# Patient Record
Sex: Female | Born: 1970 | Race: Black or African American | Hispanic: No | Marital: Married | Smoking: Current every day smoker
Health system: Southern US, Community
[De-identification: ages and names within clinical notes are randomized; demographics above are authoritative.]

## PROBLEM LIST (undated history)

## (undated) DIAGNOSIS — K219 Gastro-esophageal reflux disease without esophagitis: Secondary | ICD-10-CM

## (undated) DIAGNOSIS — I10 Essential (primary) hypertension: Secondary | ICD-10-CM

## (undated) DIAGNOSIS — R569 Unspecified convulsions: Secondary | ICD-10-CM

## (undated) DIAGNOSIS — F32A Depression, unspecified: Secondary | ICD-10-CM

## (undated) DIAGNOSIS — E119 Type 2 diabetes mellitus without complications: Secondary | ICD-10-CM

## (undated) DIAGNOSIS — L732 Hidradenitis suppurativa: Secondary | ICD-10-CM

## (undated) DIAGNOSIS — I639 Cerebral infarction, unspecified: Secondary | ICD-10-CM

## (undated) DIAGNOSIS — J40 Bronchitis, not specified as acute or chronic: Secondary | ICD-10-CM

## (undated) DIAGNOSIS — F329 Major depressive disorder, single episode, unspecified: Secondary | ICD-10-CM

## (undated) DIAGNOSIS — D649 Anemia, unspecified: Secondary | ICD-10-CM

## (undated) DIAGNOSIS — J45909 Unspecified asthma, uncomplicated: Secondary | ICD-10-CM

## (undated) DIAGNOSIS — Z87442 Personal history of urinary calculi: Secondary | ICD-10-CM

## (undated) HISTORY — DX: Depression, unspecified: F32.A

## (undated) HISTORY — DX: Hidradenitis suppurativa: L73.2

## (undated) HISTORY — DX: Unspecified convulsions: R56.9

## (undated) HISTORY — DX: Cerebral infarction, unspecified: I63.9

## (undated) HISTORY — DX: Bronchitis, not specified as acute or chronic: J40

## (undated) HISTORY — DX: Major depressive disorder, single episode, unspecified: F32.9

## (undated) HISTORY — PX: INCISE AND DRAIN ABCESS: PRO64

## (undated) HISTORY — DX: Gastro-esophageal reflux disease without esophagitis: K21.9

---

## 2001-04-24 ENCOUNTER — Other Ambulatory Visit: Admission: RE | Admit: 2001-04-24 | Discharge: 2001-04-24 | Payer: Self-pay | Admitting: Obstetrics and Gynecology

## 2001-05-04 ENCOUNTER — Encounter: Admission: RE | Admit: 2001-05-04 | Discharge: 2001-08-02 | Payer: Self-pay | Admitting: Obstetrics and Gynecology

## 2001-12-29 ENCOUNTER — Encounter: Payer: Self-pay | Admitting: Emergency Medicine

## 2001-12-30 ENCOUNTER — Encounter: Payer: Self-pay | Admitting: Family Medicine

## 2001-12-30 ENCOUNTER — Observation Stay (HOSPITAL_COMMUNITY): Admission: EM | Admit: 2001-12-30 | Discharge: 2001-12-30 | Payer: Self-pay | Admitting: Family Medicine

## 2002-01-04 ENCOUNTER — Encounter: Admission: RE | Admit: 2002-01-04 | Discharge: 2002-01-04 | Payer: Self-pay | Admitting: Family Medicine

## 2005-02-03 ENCOUNTER — Emergency Department (HOSPITAL_COMMUNITY): Admission: EM | Admit: 2005-02-03 | Discharge: 2005-02-03 | Payer: Self-pay | Admitting: Emergency Medicine

## 2005-02-19 ENCOUNTER — Emergency Department (HOSPITAL_COMMUNITY): Admission: EM | Admit: 2005-02-19 | Discharge: 2005-02-19 | Payer: Self-pay | Admitting: Emergency Medicine

## 2005-03-12 ENCOUNTER — Emergency Department (HOSPITAL_COMMUNITY): Admission: EM | Admit: 2005-03-12 | Discharge: 2005-03-12 | Payer: Self-pay | Admitting: Emergency Medicine

## 2005-03-18 ENCOUNTER — Emergency Department (HOSPITAL_COMMUNITY): Admission: EM | Admit: 2005-03-18 | Discharge: 2005-03-18 | Payer: Self-pay | Admitting: Emergency Medicine

## 2005-04-12 ENCOUNTER — Emergency Department (HOSPITAL_COMMUNITY): Admission: EM | Admit: 2005-04-12 | Discharge: 2005-04-12 | Payer: Self-pay | Admitting: Emergency Medicine

## 2005-04-16 ENCOUNTER — Emergency Department (HOSPITAL_COMMUNITY): Admission: EM | Admit: 2005-04-16 | Discharge: 2005-04-16 | Payer: Self-pay | Admitting: Emergency Medicine

## 2005-05-17 ENCOUNTER — Emergency Department (HOSPITAL_COMMUNITY): Admission: EM | Admit: 2005-05-17 | Discharge: 2005-05-17 | Payer: Self-pay | Admitting: Emergency Medicine

## 2005-05-26 ENCOUNTER — Emergency Department (HOSPITAL_COMMUNITY): Admission: EM | Admit: 2005-05-26 | Discharge: 2005-05-27 | Payer: Self-pay | Admitting: Emergency Medicine

## 2005-06-28 ENCOUNTER — Emergency Department (HOSPITAL_COMMUNITY): Admission: EM | Admit: 2005-06-28 | Discharge: 2005-06-28 | Payer: Self-pay | Admitting: *Deleted

## 2006-01-21 ENCOUNTER — Emergency Department (HOSPITAL_COMMUNITY): Admission: EM | Admit: 2006-01-21 | Discharge: 2006-01-21 | Payer: Self-pay | Admitting: Emergency Medicine

## 2006-03-16 ENCOUNTER — Inpatient Hospital Stay (HOSPITAL_COMMUNITY): Admission: AD | Admit: 2006-03-16 | Discharge: 2006-03-16 | Payer: Self-pay | Admitting: Obstetrics and Gynecology

## 2006-06-09 ENCOUNTER — Emergency Department (HOSPITAL_COMMUNITY): Admission: EM | Admit: 2006-06-09 | Discharge: 2006-06-09 | Payer: Self-pay | Admitting: Emergency Medicine

## 2008-06-10 ENCOUNTER — Emergency Department (HOSPITAL_COMMUNITY): Admission: EM | Admit: 2008-06-10 | Discharge: 2008-06-10 | Payer: Self-pay | Admitting: Family Medicine

## 2011-02-19 NOTE — Discharge Summary (Signed)
[place]  Patient:    Caitlin Fritz, Caitlin Fritz Visit Number: [id number] MRN: [id number]          Service Type: MED Location: [phone] Attending Physician:  Willow Ora Dictated by:   Maryelizabeth Rowan, M.D. Admit Date:  12/30/2001 Discharge Date: 12/30/2001                             Discharge Summary  DATE OF BIRTH:  15-Feb-1971  CHIEF COMPLAINT:  Chest pain.  HISTORY OF PRESENT ILLNESS:  Chest pain:  HOSPITAL COURSE:  #1 - CHEST PAIN:   The patient presented complaining of substernal chest pain, acute onset of sharp, left-sided, associated with bilateral hand tingling, numbness, dryness, and feeling of impending doom.  She was concerned she would die from a heart attack.  She had a similar episode in 1998 which was diagnosed as an anxiety attack.  Further counselling revealed positive signs and symptoms of depression with significant stressor at home with difficult relationship with her twin sister who is her roommate.  She denies suicidal or homicidal ideation.  The patient was admitted to the hospital for atypical chest pain.  She was ruled out for an MI.  All enzymes were negative.  The feeling is this is most likely anxiety.  She also has some depression.  She will be placed on new medication list as below.  She needed no nitroglycerin.  Will recommend Xanax as needed for anxiety.  #2 - DIABETES MELLITUS, TYPE 2:  She did have an increased fasting blood sugar this a.m., and we recommended followup and outpatient education.  She is morbidly obese and discussed significant weight loss.  She is also at risk for PCO given her obesity, hirsutism, anemia, and inability to conceive. Apparently she has had some outpatient workup in the past for this.  Will recommend following up her blood sugars and questionable PCO status as an outpatient.  DISCHARGE MEDICATIONS: 1. Protonix 40 mg p.o. q.d. 2. Xanax 0.1 mg t.i.d. p.r.n.  anxiety. 3. Lexapro 10 mg once a day.  FOLLOWUP:  She is to follow up with her primary doctor at [facility] next week. Dictated by:   Maryelizabeth Rowan, M.D. Attending Physician:  Willow Ora DD:  12/30/01 TD:  12/31/01 Job: [id number]

## 2011-10-05 HISTORY — PX: INCISION AND DRAINAGE: SHX5863

## 2013-02-13 ENCOUNTER — Emergency Department (HOSPITAL_COMMUNITY)
Admission: EM | Admit: 2013-02-13 | Discharge: 2013-02-13 | Disposition: A | Discharge status: Home / Self Care | Payer: Self-pay | Attending: Emergency Medicine | Admitting: Emergency Medicine

## 2013-02-13 ENCOUNTER — Emergency Department (HOSPITAL_COMMUNITY): Payer: Self-pay

## 2013-02-13 ENCOUNTER — Encounter (HOSPITAL_COMMUNITY): Payer: Self-pay | Admitting: Emergency Medicine

## 2013-02-13 DIAGNOSIS — F17200 Nicotine dependence, unspecified, uncomplicated: Secondary | ICD-10-CM | POA: Insufficient documentation

## 2013-02-13 DIAGNOSIS — R6883 Chills (without fever): Secondary | ICD-10-CM | POA: Insufficient documentation

## 2013-02-13 DIAGNOSIS — R51 Headache: Secondary | ICD-10-CM | POA: Insufficient documentation

## 2013-02-13 DIAGNOSIS — J3489 Other specified disorders of nose and nasal sinuses: Secondary | ICD-10-CM | POA: Insufficient documentation

## 2013-02-13 DIAGNOSIS — Z79899 Other long term (current) drug therapy: Secondary | ICD-10-CM | POA: Insufficient documentation

## 2013-02-13 DIAGNOSIS — E119 Type 2 diabetes mellitus without complications: Secondary | ICD-10-CM | POA: Insufficient documentation

## 2013-02-13 DIAGNOSIS — R05 Cough: Secondary | ICD-10-CM

## 2013-02-13 DIAGNOSIS — I10 Essential (primary) hypertension: Secondary | ICD-10-CM | POA: Insufficient documentation

## 2013-02-13 DIAGNOSIS — R5381 Other malaise: Secondary | ICD-10-CM | POA: Insufficient documentation

## 2013-02-13 DIAGNOSIS — Z9119 Patient's noncompliance with other medical treatment and regimen: Secondary | ICD-10-CM | POA: Insufficient documentation

## 2013-02-13 DIAGNOSIS — R059 Cough, unspecified: Secondary | ICD-10-CM | POA: Insufficient documentation

## 2013-02-13 DIAGNOSIS — Z91199 Patient's noncompliance with other medical treatment and regimen due to unspecified reason: Secondary | ICD-10-CM | POA: Insufficient documentation

## 2013-02-13 DIAGNOSIS — J45901 Unspecified asthma with (acute) exacerbation: Secondary | ICD-10-CM | POA: Insufficient documentation

## 2013-02-13 DIAGNOSIS — R52 Pain, unspecified: Secondary | ICD-10-CM | POA: Insufficient documentation

## 2013-02-13 HISTORY — DX: Essential (primary) hypertension: I10

## 2013-02-13 HISTORY — DX: Type 2 diabetes mellitus without complications: E11.9

## 2013-02-13 LAB — CBC WITH DIFFERENTIAL/PLATELET
Eosinophils Absolute: 0.1 10*3/uL (ref 0.0–0.7)
Eosinophils Relative: 1 % (ref 0–5)
HCT: 36.9 % (ref 36.0–46.0)
Lymphocytes Relative: 35 % (ref 12–46)
Monocytes Relative: 6 % (ref 3–12)
Neutrophils Relative %: 58 % (ref 43–77)
WBC: 8.2 10*3/uL (ref 4.0–10.5)

## 2013-02-13 LAB — BASIC METABOLIC PANEL
BUN: 5 mg/dL — ABNORMAL LOW (ref 6–23)
CO2: 28 mEq/L (ref 19–32)
Calcium: 9.2 mg/dL (ref 8.4–10.5)
Chloride: 99 mEq/L (ref 96–112)
GFR calc Af Amer: >90 mL/min (ref >90)
GFR calc non Af Amer: >90 mL/min (ref >90)
Glucose, Bld: 334 mg/dL — ABNORMAL HIGH (ref 70–99)

## 2013-02-13 LAB — GLUCOSE, CAPILLARY: Glucose-Capillary: 384 mg/dL — ABNORMAL HIGH (ref 70–99)

## 2013-02-13 MED ORDER — PREDNISONE 20 MG PO TABS: 20.0000 mg | ORAL_TABLET | Freq: Every day | ORAL | Status: DC

## 2013-02-13 MED ORDER — POTASSIUM CHLORIDE CRYS ER 20 MEQ PO TBCR
40.0000 meq | EXTENDED_RELEASE_TABLET | Freq: Once | ORAL | Status: AC
  Administered 2013-02-13: 40 meq via ORAL
  Filled 2013-02-13: qty 2

## 2013-02-13 MED ORDER — ALBUTEROL SULFATE (5 MG/ML) 0.5% IN NEBU
2.5000 mg | INHALATION_SOLUTION | Freq: Once | RESPIRATORY_TRACT | Status: AC
  Administered 2013-02-13: 2.5 mg via RESPIRATORY_TRACT
  Filled 2013-02-13: qty 0.5

## 2013-02-13 MED ORDER — ALBUTEROL SULFATE HFA 108 (90 BASE) MCG/ACT IN AERS: 2.0000 | INHALATION_SPRAY | RESPIRATORY_TRACT | Status: DC | PRN

## 2013-02-13 MED ORDER — INSULIN ASPART 100 UNIT/ML ~~LOC~~ SOLN
10.0000 [IU] | Freq: Once | SUBCUTANEOUS | Status: AC
  Administered 2013-02-13: 10 [IU] via SUBCUTANEOUS
  Filled 2013-02-13: qty 1

## 2013-02-13 MED ORDER — HYDROCODONE-HOMATROPINE 5-1.5 MG/5ML PO SYRP: 2.5000 mL | ORAL_SOLUTION | Freq: Three times a day (TID) | ORAL | Status: DC | PRN

## 2013-02-13 MED ORDER — ALBUTEROL SULFATE HFA 108 (90 BASE) MCG/ACT IN AERS
2.0000 | INHALATION_SPRAY | Freq: Once | RESPIRATORY_TRACT | Status: AC
  Administered 2013-02-13: 2 via RESPIRATORY_TRACT
  Filled 2013-02-13: qty 6.7

## 2013-02-13 NOTE — ED Provider Notes (Signed)
History     CSN: [id number]  Arrival date & time 02/13/13  1312   First MD Initiated Contact with Patient 02/13/13 1336      Chief Complaint  Patient presents with  . Cough  . Generalized Body Aches    (Consider location/radiation/quality/duration/timing/severity/associated sxs/prior treatment) HPI  Pt is a 42 yo F PMHx significant for reactive airway disease, HTN, and DM presenting to the ED with worsening non-productive cough for three days w/ associated generalized fatigue, headache, chills. The cough is worse at night and with laying down. Pt has tried one course of Mucinex w/o relief. No alleviating factors. Pt states she gets bronchitis every year and her symptoms are the same as previous bronchitis Denies SOB, chest pain, leg swelling, rhinorrhea, visual disturbances. She no longer has an inhaler at home. Pt is non-compliant with diabetes or hypertension medications, has not taken medications in one month.   Past Medical History  Diagnosis Date  . Hypertension   . Diabetes mellitus without complication     History reviewed. No pertinent past surgical history.  History reviewed. No pertinent family history.  History  Substance Use Topics  . Smoking status: Current Every Day Smoker  . Smokeless tobacco: Not on file  . Alcohol Use: Yes     Comment: occ    OB History   Grav Para Term Preterm Abortions TAB SAB Ect Mult Living                  Review of Systems  Constitutional: Positive for chills. Negative for fever and diaphoresis.  HENT: Positive for congestion.   Eyes: Negative for visual disturbance.  Respiratory: Positive for cough. Negative for shortness of breath and wheezing.   Cardiovascular: Negative for chest pain, palpitations and leg swelling.  Gastrointestinal: Negative for nausea, vomiting, abdominal pain, diarrhea and constipation.  Genitourinary: Negative.   Musculoskeletal: Negative.   Skin: Negative.   Neurological: Positive for headaches.     Allergies  Clonidine derivatives and Prednisone  Home Medications   Current Outpatient Rx  Name  Route  Sig  Dispense  Refill  . albuterol (PROVENTIL HFA;VENTOLIN HFA) 108 (90 BASE) MCG/ACT inhaler   Inhalation   Inhale 2 puffs into the lungs every 6 (six) hours as needed for wheezing. For wheezing         . albuterol (PROVENTIL HFA;VENTOLIN HFA) 108 (90 BASE) MCG/ACT inhaler   Inhalation   Inhale 2 puffs into the lungs every 4 (four) hours as needed for wheezing.   3.7 g   1   . HYDROcodone-homatropine (HYCODAN) 5-1.5 MG/5ML syrup   Oral   Take 2.5 mLs by mouth every 8 (eight) hours as needed for cough.   30 mL   0   . predniSONE (DELTASONE) 20 MG tablet   Oral   Take 1 tablet (20 mg total) by mouth daily.   5 tablet   0   . predniSONE (DELTASONE) 20 MG tablet   Oral   Take 1 tablet (20 mg total) by mouth daily.   5 tablet   0     BP 158/84  Pulse 91  Temp(Src) 98.9 F (37.2 C) (Oral)  Resp 20  SpO2 100%  LMP 02/03/2013  Physical Exam  Constitutional: She is oriented to person, place, and time. She appears well-developed and well-nourished. No distress.  HENT:  Head: Normocephalic and atraumatic.  Mouth/Throat: Oropharynx is clear and moist. No oropharyngeal exudate.  Eyes: EOM are normal. Pupils are equal, round, and  reactive to light.  Neck: Normal range of motion. Neck supple.  Cardiovascular: Normal rate, regular rhythm and normal heart sounds.   Pulmonary/Chest: Effort normal and breath sounds normal. No accessory muscle usage. No respiratory distress. She has no wheezes. She has no rales. She exhibits tenderness.  Chest wall tender to palpation.   Abdominal: Soft. Bowel sounds are normal. There is no tenderness.  Musculoskeletal: Normal range of motion.  Lymphadenopathy:    She has no cervical adenopathy.  Neurological: She is alert and oriented to person, place, and time.  Skin: Skin is warm and dry. No rash noted. She is not diaphoretic.   Psychiatric: She has a normal mood and affect.    ED Course  Procedures (including critical care time)  Labs Reviewed  GLUCOSE, CAPILLARY - Abnormal; Notable for the following:    Glucose-Capillary 384 (*)    All other components within normal limits  BASIC METABOLIC PANEL - Abnormal; Notable for the following:    Sodium 134 (*)    Potassium 3.0 (*)    Glucose, Bld 334 (*)    BUN 5 (*)    All other components within normal limits  GLUCOSE, CAPILLARY - Abnormal; Notable for the following:    Glucose-Capillary 247 (*)    All other components within normal limits  CBC WITH DIFFERENTIAL   Dg Chest 2 View  02/13/2013   *RADIOLOGY REPORT*  Clinical Data: Cough and fever  CHEST - 2 VIEW  Comparison: 05/26/2005  Findings: The heart and pulmonary vascularity are within normal limits.  The lungs are clear bilaterally.  No acute bony abnormality is seen.  IMPRESSION: No acute abnormality noted.   Original Report Authenticated By: Alcide Clever, M.D.     1. Asthma exacerbation   2. Cough       MDM  Pt CXR negative for acute infiltrate. Patients symptoms are consistent with URI, likely viral etiology, exacerbated by her asthma. Discussed that antibiotics are not indicated for viral infections. Pt will be discharged with symptomatic treatment.  Verbalizes understanding and is agreeable with plan. Pt is hemodynamically stable & in NAD prior to dc. Patient d/w with Dr. Fredderick Phenix, agrees with plan. Patient is stable at time of discharge            Jeannetta Ellis, PA-C 02/14/13 1108

## 2013-02-13 NOTE — ED Notes (Signed)
Pt alert and mentating appropriately upon d/c. Pt given d/c teaching and prescriptions. Pt verbalizes understanding of d/c teaching and has no further questions upon d/c. NAD noted upon d/c. Pt given inhaler to go home with per order by Candise Bowens, PA. Pt ambulatory upon d/c. Pt leaving with d/c teaching and prescriptions.

## 2013-02-13 NOTE — ED Notes (Signed)
Pt sts cough with pain with cough x 3 days; pt sts body aches and low grade fever last pm; pt sts has not had htn or dm meds x 1 month

## 2013-02-13 NOTE — ED Notes (Signed)
Jen, PA at bedside.

## 2013-02-14 LAB — GLUCOSE, CAPILLARY: Glucose-Capillary: 247 mg/dL — ABNORMAL HIGH (ref 70–99)

## 2013-02-14 NOTE — ED Provider Notes (Signed)
Medical screening examination/treatment/procedure(s) were performed by non-physician practitioner and as supervising physician I was immediately available for consultation/collaboration.   Rolan Bucco, MD 02/14/13 [phone]

## 2013-03-30 ENCOUNTER — Emergency Department (HOSPITAL_COMMUNITY)
Admission: EM | Admit: 2013-03-30 | Discharge: 2013-03-30 | Disposition: A | Discharge status: Home / Self Care | Payer: Self-pay | Attending: Emergency Medicine | Admitting: Emergency Medicine

## 2013-03-30 ENCOUNTER — Encounter (HOSPITAL_COMMUNITY): Payer: Self-pay

## 2013-03-30 DIAGNOSIS — K0889 Other specified disorders of teeth and supporting structures: Secondary | ICD-10-CM

## 2013-03-30 DIAGNOSIS — E119 Type 2 diabetes mellitus without complications: Secondary | ICD-10-CM | POA: Insufficient documentation

## 2013-03-30 DIAGNOSIS — Z79899 Other long term (current) drug therapy: Secondary | ICD-10-CM | POA: Insufficient documentation

## 2013-03-30 DIAGNOSIS — K089 Disorder of teeth and supporting structures, unspecified: Secondary | ICD-10-CM | POA: Insufficient documentation

## 2013-03-30 DIAGNOSIS — F17200 Nicotine dependence, unspecified, uncomplicated: Secondary | ICD-10-CM | POA: Insufficient documentation

## 2013-03-30 DIAGNOSIS — I10 Essential (primary) hypertension: Secondary | ICD-10-CM | POA: Insufficient documentation

## 2013-03-30 MED ORDER — HYDROCODONE-ACETAMINOPHEN 5-325 MG PO TABS: 1.0000 | ORAL_TABLET | Freq: Four times a day (QID) | ORAL | Status: DC | PRN

## 2013-03-30 MED ORDER — PENICILLIN V POTASSIUM 500 MG PO TABS: 500.0000 mg | ORAL_TABLET | Freq: Three times a day (TID) | ORAL | Status: DC

## 2013-03-30 MED ORDER — LISINOPRIL 20 MG PO TABS: 20.0000 mg | ORAL_TABLET | Freq: Every day | ORAL | Status: DC

## 2013-03-30 NOTE — ED Notes (Addendum)
Pt presents with 2 day h/o R lower tooth pain.  Pt reports tooth is broken, has been calling dentist and can't get in to see them until the end of August.  Pt reports no BP medication x 3 months.

## 2013-03-30 NOTE — ED Provider Notes (Signed)
History    This chart was scribed for non-physician practitioner, Fayrene Helper PA-C working with Gwyneth Sprout, MD by Donne Anon, ED Scribe. This patient was seen in room TR06C/TR06C and the patient's care was started at 1508.  CSN: [id number] Arrival date & time 03/30/13  1405  First MD Initiated Contact with Patient 03/30/13 1508     Chief Complaint  Patient presents with  . Dental Pain    The history is provided by the patient. No language interpreter was used.   HPI Comments: Caitlin Fritz is a 42 y.o. female who presents to the Emergency Department complaining of 2 days of gradual onset, gradually worsening, constant right lower dental pain that radiates to her entire mouth and worsened significantly today. She states she has called several dentists, but none can see her until August. She reports associated ear pain. Eating and cold air make the pain worse. She placed a filling in at home which has provided some relief. She denies fever, difficulty swallowing, or any other pain. She denies any previous similar episodes. She has a hx of HTN but hasn't taken her Lisonopril 25 mg once daily and Metformin twice a day for the past 3 months because she cannot afford it.  Her PCP is [facility] in [place].  Past Medical History  Diagnosis Date  . Hypertension   . Diabetes mellitus without complication    History reviewed. No pertinent past surgical history. History reviewed. No pertinent family history. History  Substance Use Topics  . Smoking status: Current Every Day Smoker  . Smokeless tobacco: Not on file  . Alcohol Use: Yes     Comment: occ   OB History   Grav Para Term Preterm Abortions TAB SAB Ect Mult Living                 Review of Systems  Constitutional: Negative for fever.  HENT: Positive for dental problem. Negative for trouble swallowing.   All other systems reviewed and are negative.    Allergies  Clonidine derivatives and  Prednisone  Home Medications   Current Outpatient Rx  Name  Route  Sig  Dispense  Refill  . albuterol (PROVENTIL HFA;VENTOLIN HFA) 108 (90 BASE) MCG/ACT inhaler   Inhalation   Inhale 2 puffs into the lungs every 4 (four) hours as needed for wheezing.   3.7 g   1   . Aspirin-Acetaminophen (GOODYS BODY PAIN PO)   Oral   Take 1 packet by mouth as needed (pain).         . Aspirin-Caffeine (BC FAST PAIN RELIEF PO)   Oral   Take 1 packet by mouth as needed (pain).          BP 186/94  Pulse 89  Temp(Src) 98.2 F (36.8 C) (Oral)  Resp 18  SpO2 96%  LMP 03/18/2013  Physical Exam  Nursing note and vitals reviewed. Constitutional: She appears well-developed and well-nourished. No distress.  HENT:  Head: Normocephalic and atraumatic.  Right Ear: External ear normal.  Left Ear: External ear normal.  Nose: Nose normal.  Mouth/Throat: Oropharynx is clear and moist. No oropharyngeal exudate.  Old dental decay and evulsion of 2nd molar on right lower jaw.  Eyes: Conjunctivae are normal.  Neck: Neck supple. No tracheal deviation present.  Cardiovascular: Normal rate.   Pulmonary/Chest: Effort normal. No respiratory distress.  Musculoskeletal: Normal range of motion.  Lymphadenopathy:    She has no cervical adenopathy.  Neurological: She is alert.  Skin:  Skin is warm and dry.  Psychiatric: She has a normal mood and affect. Her behavior is normal.    ED Course  Procedures (including critical care time) DIAGNOSTIC STUDIES: Oxygen Saturation is 97% on RA, adequate by my interpretation.    COORDINATION OF CARE: 3:31 PM Discussed treatment plan which includes dental refferal with pt at bedside and pt agreed to plan.   3:37 PM Dental pain, no obvious abscess amenable for drainage.  Likely exposed nerve.  Also is hypertensive with BP 214/95.  HTN likely due to pain and not taking BP medication.  Will refill lisinopril and have to to have close f/u with PCP for BP recheck.  Will  treat dental pain with Pen V and vicodin. Dental referral given.  Labs Reviewed - No data to display No results found. 1. Pain, dental     MDM  BP 186/94  Pulse 89  Temp(Src) 98.2 F (36.8 C) (Oral)  Resp 18  SpO2 96%  LMP 03/18/2013  I personally performed the services described in this documentation, which was scribed in my presence. The recorded information has been reviewed and is accurate.      Fayrene Helper, PA-C 03/30/13 1555

## 2013-03-30 NOTE — ED Notes (Signed)
Pt c/o right sided upper and lower dental pain X 2 days, sts she can't get into her dentist until the end of august and if she waits that long then she will be dead. Pt pacing around room, can't sit still. Tried taking goody powder and put oragel PTA today, no relief. Pt in nad, skin warm and dry, resp e/u.

## 2013-03-31 NOTE — ED Provider Notes (Signed)
Medical screening examination/treatment/procedure(s) were performed by non-physician practitioner and as supervising physician I was immediately available for consultation/collaboration.   Rochell Puett, MD 03/31/13 2311 

## 2013-04-02 ENCOUNTER — Telehealth (HOSPITAL_COMMUNITY): Payer: Self-pay | Admitting: Emergency Medicine

## 2013-04-21 ENCOUNTER — Encounter (HOSPITAL_COMMUNITY): Payer: Self-pay | Admitting: *Deleted

## 2013-04-21 DIAGNOSIS — E1169 Type 2 diabetes mellitus with other specified complication: Secondary | ICD-10-CM | POA: Insufficient documentation

## 2013-04-21 DIAGNOSIS — I10 Essential (primary) hypertension: Secondary | ICD-10-CM | POA: Insufficient documentation

## 2013-04-21 DIAGNOSIS — R51 Headache: Secondary | ICD-10-CM | POA: Insufficient documentation

## 2013-04-21 DIAGNOSIS — Z79899 Other long term (current) drug therapy: Secondary | ICD-10-CM | POA: Insufficient documentation

## 2013-04-21 DIAGNOSIS — F17200 Nicotine dependence, unspecified, uncomplicated: Secondary | ICD-10-CM | POA: Insufficient documentation

## 2013-04-21 NOTE — ED Notes (Signed)
Pt c/o HA that woke her from sleep at 2230 with blurry vision, photophobia. Described as throbbing in frontal forehead region, and stiff neck

## 2013-04-22 ENCOUNTER — Emergency Department (HOSPITAL_COMMUNITY)
Admission: EM | Admit: 2013-04-22 | Discharge: 2013-04-22 | Disposition: A | Discharge status: Home / Self Care | Payer: Self-pay | Attending: Emergency Medicine | Admitting: Emergency Medicine

## 2013-04-22 ENCOUNTER — Emergency Department (HOSPITAL_COMMUNITY): Payer: Self-pay

## 2013-04-22 DIAGNOSIS — E119 Type 2 diabetes mellitus without complications: Secondary | ICD-10-CM

## 2013-04-22 DIAGNOSIS — R739 Hyperglycemia, unspecified: Secondary | ICD-10-CM

## 2013-04-22 DIAGNOSIS — I10 Essential (primary) hypertension: Secondary | ICD-10-CM

## 2013-04-22 MED ORDER — LISINOPRIL 20 MG PO TABS
20.0000 mg | ORAL_TABLET | Freq: Once | ORAL | Status: AC
  Administered 2013-04-22: 20 mg via ORAL
  Filled 2013-04-22: qty 1

## 2013-04-22 MED ORDER — METFORMIN HCL 1000 MG PO TABS: 1000.0000 mg | ORAL_TABLET | Freq: Two times a day (BID) | ORAL | Status: DC

## 2013-04-22 MED ORDER — LISINOPRIL 20 MG PO TABS: 20.0000 mg | ORAL_TABLET | Freq: Every day | ORAL | Status: DC

## 2013-04-22 MED ORDER — METOCLOPRAMIDE HCL 5 MG/ML IJ SOLN: 10.0000 mg | Freq: Once | INTRAMUSCULAR | Status: DC

## 2013-04-22 MED ORDER — OXYCODONE-ACETAMINOPHEN 5-325 MG PO TABS
2.0000 | ORAL_TABLET | Freq: Once | ORAL | Status: AC
  Administered 2013-04-22: 2 via ORAL
  Filled 2013-04-22: qty 2

## 2013-04-22 MED ORDER — MORPHINE SULFATE 4 MG/ML IJ SOLN: 4.0000 mg | Freq: Once | INTRAMUSCULAR | Status: DC

## 2013-04-22 NOTE — ED Provider Notes (Signed)
History    CSN: [id number] Arrival date & time 04/21/13  2334  First MD Initiated Contact with Patient 04/22/13 0254     Chief Complaint  Patient presents with  . Headache   (Consider location/radiation/quality/duration/timing/severity/associated sxs/prior Treatment) HPI This patient is a pleasant 42 yo type 2 diabetic with HTN who presents with complaints of diffuse headache over the past 3 days. Her sx began gradually in the frontal region. Now, she has pain in a band like distribution which radiates to her neck. She denies history of trauma. She does not have chronic headaches.   Pain is currently 8/10. Patient denies any focal neurologic deficits. No visual changes. Light and sound seem to make pain worse. Pain waxes and wanes in severity. PT feels most comfortable while resting quietly in a darkened room.   Past Medical History  Diagnosis Date  . Hypertension   . Diabetes mellitus without complication    No past surgical history on file. No family history on file. History  Substance Use Topics  . Smoking status: Current Every Day Smoker  . Smokeless tobacco: Not on file  . Alcohol Use: Yes     Comment: occ   OB History   Grav Para Term Preterm Abortions TAB SAB Ect Mult Living                 Review of Systems Gen: no weight loss, fevers, chills, night sweats Eyes: no discharge or drainage, no occular pain or visual changes Nose: no epistaxis or rhinorrhea Mouth: no dental pain, no sore throat Neck: no neck pain Lungs: no SOB, cough, wheezing CV: no chest pain, palpitations, dependent edema or orthopnea Abd: no abdominal pain, nausea, vomiting GU: no dysuria or gross hematuria MSK: no myalgias or arthralgias Neuro: Per history of present illness, otherwise negative Skin: no rash Psyche: negative.  Allergies  Clonidine derivatives and Prednisone  Home Medications   Current Outpatient Rx  Name  Route  Sig  Dispense  Refill  . albuterol (PROVENTIL  HFA;VENTOLIN HFA) 108 (90 BASE) MCG/ACT inhaler   Inhalation   Inhale 2 puffs into the lungs every 4 (four) hours as needed for wheezing.   3.7 g   1   . Aspirin-Acetaminophen (GOODYS BODY PAIN PO)   Oral   Take 1 packet by mouth 2 (two) times daily as needed (pain).          Marland Kitchen ibuprofen (ADVIL,MOTRIN) 200 MG tablet   Oral   Take 800 mg by mouth every 8 (eight) hours as needed for pain.         Marland Kitchen lisinopril (PRINIVIL,ZESTRIL) 20 MG tablet   Oral   Take 1 tablet (20 mg total) by mouth daily.   30 tablet   0    BP 199/97  Pulse 91  Temp(Src) 99.1 F (37.3 C) (Oral)  SpO2 96%  LMP 03/18/2013 Physical Exam Gen: well developed and well nourished appearing Head: NCAT Eyes: PERL, EOMI Nose: no epistaixis or rhinorrhea Mouth/throat: mucosa is moist and pink Neck: supple, no stridor Lungs: CTA B, no wheezing, rhonchi or rales CV: RRR, no murmur Abd: soft, notender, nondistended Back: no ttp, no cva ttp Skin: no rashese, wnl Ext: no edema Neuro: CN ii-xii grossly intact, no focal deficits, normal gait, 5/5 motor strength both arms and legs, no dysmetria.  Psyche; normal affect,  calm and cooperative.  ED Course  Procedures (including critical care time)  CT HEAD WITHOUT CONTRAST  Technique: Contiguous axial images were obtained  from the base of the skull through the vertex without contrast.  Comparison: CT of the head performed 06/09/2006  Findings: There is no evidence of acute infarction, mass lesion, or intra- or extra-axial hemorrhage on CT.  Scattered periventricular and subcortical white matter change likely reflects small vessel ischemic microangiopathy.  The posterior fossa, including the cerebellum, brainstem and fourth ventricle, is within normal limits. The third and lateral ventricles, and basal ganglia are unremarkable in appearance. The cerebral hemispheres are symmetric in appearance, with normal gray- white differentiation. No mass effect or  midline shift is seen.  There is no evidence of fracture; visualized osseous structures are unremarkable in appearance. The orbits are within normal limits. The paranasal sinuses and mastoid air cells are well-aerated. No significant soft tissue abnormalities are seen.  IMPRESSION:  1. No acute intracranial pathology seen on CT. 2. Scattered small vessel ischemic microangiopathy.   MDM  Patient with resolution of headache. Improvement of HTN.  Noted to be hyperglycemic but, also known to be uncontrolled diabetic. The patient refused IVF. Patient counseled extensively re: importance of medical compliance and follow up. We will restart her on previous antihypertensives and on Metformin for DM. Patient is referred to [facility] for close outpt f/u.   Brandt Loosen, MD 04/22/13 [phone]

## 2013-04-22 NOTE — ED Notes (Signed)
0555  Pain from headache decreased

## 2013-05-04 ENCOUNTER — Encounter (HOSPITAL_COMMUNITY): Payer: Self-pay | Admitting: Emergency Medicine

## 2013-05-04 ENCOUNTER — Emergency Department (HOSPITAL_COMMUNITY): Payer: BC Managed Care – PPO

## 2013-05-04 ENCOUNTER — Emergency Department (HOSPITAL_COMMUNITY)
Admission: EM | Admit: 2013-05-04 | Discharge: 2013-05-04 | Disposition: A | Discharge status: Home / Self Care | Payer: BC Managed Care – PPO | Attending: Emergency Medicine | Admitting: Emergency Medicine

## 2013-05-04 DIAGNOSIS — R739 Hyperglycemia, unspecified: Secondary | ICD-10-CM

## 2013-05-04 DIAGNOSIS — J45901 Unspecified asthma with (acute) exacerbation: Secondary | ICD-10-CM | POA: Insufficient documentation

## 2013-05-04 DIAGNOSIS — I10 Essential (primary) hypertension: Secondary | ICD-10-CM | POA: Insufficient documentation

## 2013-05-04 DIAGNOSIS — F17200 Nicotine dependence, unspecified, uncomplicated: Secondary | ICD-10-CM | POA: Insufficient documentation

## 2013-05-04 DIAGNOSIS — R7309 Other abnormal glucose: Secondary | ICD-10-CM | POA: Insufficient documentation

## 2013-05-04 DIAGNOSIS — E119 Type 2 diabetes mellitus without complications: Secondary | ICD-10-CM | POA: Insufficient documentation

## 2013-05-04 DIAGNOSIS — J4521 Mild intermittent asthma with (acute) exacerbation: Secondary | ICD-10-CM

## 2013-05-04 HISTORY — DX: Unspecified asthma, uncomplicated: J45.909

## 2013-05-04 LAB — CBC
Hemoglobin: 12.9 g/dL (ref 12.0–15.0)
MCH: 26.7 pg (ref 26.0–34.0)
RBC: 4.84 MIL/uL (ref 3.87–5.11)

## 2013-05-04 LAB — POCT I-STAT TROPONIN I: Troponin i, poc: 0.04 ng/mL (ref 0.00–0.08)

## 2013-05-04 LAB — BASIC METABOLIC PANEL
CO2: 26 mEq/L (ref 19–32)
Calcium: 9.4 mg/dL (ref 8.4–10.5)
Chloride: 96 mEq/L (ref 96–112)
Glucose, Bld: 386 mg/dL — ABNORMAL HIGH (ref 70–99)
Potassium: 4.1 mEq/L (ref 3.5–5.1)
Sodium: 131 mEq/L — ABNORMAL LOW (ref 135–145)

## 2013-05-04 LAB — PRO B NATRIURETIC PEPTIDE: Pro B Natriuretic peptide (BNP): 335.8 pg/mL — ABNORMAL HIGH (ref 0–125)

## 2013-05-04 MED ORDER — ALBUTEROL SULFATE HFA 108 (90 BASE) MCG/ACT IN AERS: 2.0000 | INHALATION_SPRAY | RESPIRATORY_TRACT | Status: DC | PRN

## 2013-05-04 MED ORDER — GLIPIZIDE 10 MG PO TABS: 5.0000 mg | ORAL_TABLET | Freq: Two times a day (BID) | ORAL | Status: DC

## 2013-05-04 MED ORDER — ASPIRIN 325 MG PO TABS
325.0000 mg | ORAL_TABLET | Freq: Once | ORAL | Status: AC
  Administered 2013-05-04: 325 mg via ORAL
  Filled 2013-05-04: qty 1

## 2013-05-04 MED ORDER — PREDNISONE 20 MG PO TABS: ORAL_TABLET | ORAL | Status: DC

## 2013-05-04 MED ORDER — ASPIRIN EC 325 MG PO TBEC: 325.0000 mg | DELAYED_RELEASE_TABLET | Freq: Every day | ORAL | Status: DC

## 2013-05-04 MED ORDER — METFORMIN HCL 1000 MG PO TABS: 1000.0000 mg | ORAL_TABLET | Freq: Two times a day (BID) | ORAL | Status: DC

## 2013-05-04 NOTE — ED Provider Notes (Addendum)
CSN: [id number]     Arrival date & time 05/04/13  1002 History     First MD Initiated Contact with Patient 05/04/13 1037     Chief Complaint  Patient presents with  . Shortness of Breath   (Consider location/radiation/quality/duration/timing/severity/associated sxs/prior Treatment) HPI This 42 year old female states the last several weeks she wakes up at night coughing and choking and gagging feeling like she is mildly short of breath for several minutes when she sits up and walks around she feels fine, she has also had some occasional pains in both of her legs at night time for several weeks productive at a time, she is no leg pain now no leg swelling, she states the last 2 days however she has been mildly short of breath with activity better with rest and better with her albuterol inhaler but she ran out of her inhaler this morning, she has an occasional nonproductive cough the last 2 days but no fever no body aches no nasal congestion no sore throat, she has had no chest pain until today no abdominal pain no vomiting or bloody stools, she is no edema, do a constant feeling of mild shortness of breath at rest as well as worse with exertion since yesterday getting worse she came to the ED for evaluation today, since 4:00 this morning she has had a very slight vague chest pressure sensation for over 6 hours. There is no treatment prior to arrival other than albuterol inhaler this morning that seemed to help her shortness of breath. She has no sharp or stabbing chest pain no severe chest pain no exertional chest pain and no pleuritic chest pain. Her shortness of breath overnight is worse if she is supine causing coughing and better if she sits up. Past Medical History  Diagnosis Date  . Hypertension   . Diabetes mellitus without complication   . Asthma    History reviewed. No pertinent past surgical history. History reviewed. No pertinent family history. History  Substance Use Topics  . Smoking  status: Current Every Day Smoker  . Smokeless tobacco: Not on file  . Alcohol Use: Yes     Comment: occ   OB History   Grav Para Term Preterm Abortions TAB SAB Ect Mult Living                 Review of Systems 10 Systems reviewed and are negative for acute change except as noted in the HPI. Allergies  Clonidine derivatives and Prednisone  Home Medications   Current Outpatient Rx  Name  Route  Sig  Dispense  Refill  . albuterol (PROVENTIL HFA;VENTOLIN HFA) 108 (90 BASE) MCG/ACT inhaler   Inhalation   Inhale 2 puffs into the lungs every 4 (four) hours as needed for wheezing.   3.7 g   1   . Aspirin-Acetaminophen (GOODYS BODY PAIN PO)   Oral   Take 1 packet by mouth 2 (two) times daily as needed (pain).          Marland Kitchen ibuprofen (ADVIL,MOTRIN) 200 MG tablet   Oral   Take 1,000 mg by mouth every 8 (eight) hours as needed for pain.          Marland Kitchen lisinopril (PRINIVIL,ZESTRIL) 20 MG tablet   Oral   Take 1 tablet (20 mg total) by mouth daily.   20 tablet   0   . albuterol (PROVENTIL HFA;VENTOLIN HFA) 108 (90 BASE) MCG/ACT inhaler   Inhalation   Inhale 2 puffs into the lungs every 2 (  two) hours as needed for wheezing or shortness of breath (cough).   1 Inhaler   0   . aspirin EC 325 MG tablet   Oral   Take 1 tablet (325 mg total) by mouth daily.   30 tablet   0   . glipiZIDE (GLUCOTROL) 10 MG tablet   Oral   Take 0.5 tablets (5 mg total) by mouth 2 (two) times daily before a meal.   60 tablet   0   . metFORMIN (GLUCOPHAGE) 1000 MG tablet   Oral   Take 1 tablet (1,000 mg total) by mouth 2 (two) times daily.   60 tablet   0   . predniSONE (DELTASONE) 20 MG tablet      2 tabs po daily x 3 days   6 tablet   0    BP 151/78  Pulse 85  Temp(Src) 98.7 F (37.1 C) (Oral)  Resp 19  Ht 5\' 6"  (1.676 m)  Wt 221 lb (100.245 kg)  BMI 35.69 kg/m2  SpO2 98%  LMP 04/26/2013 Physical Exam  Nursing note and vitals reviewed. Constitutional:  Awake, alert, nontoxic  appearance.  HENT:  Head: Atraumatic.  Eyes: Right eye exhibits no discharge. Left eye exhibits no discharge.  Neck: Neck supple.  Cardiovascular: Normal rate and regular rhythm.   No murmur heard. Pulmonary/Chest: Effort normal and breath sounds normal. No respiratory distress. She has no wheezes. She has no rales. She exhibits no tenderness.  Abdominal: Soft. Bowel sounds are normal. She exhibits no distension. There is no tenderness. There is no rebound and no guarding.  Musculoskeletal: She exhibits no edema and no tenderness.  Baseline ROM, no obvious new focal weakness.  Neurological: She is alert.  Mental status and motor strength appears baseline for patient and situation.  Skin: No rash noted.  Psychiatric: She has a normal mood and affect.    ED Course  ECG: Normal sinus rhythm, ventricular rate 92, normal axis, inferior lateral inverted T waves, slight lateral ST depression, compared with May 2006 patient now has inverted T waves in leads 2 and V5  Recheck mild wheezing in ED; does not need neb. Patient admits she is a noncompliant diabetic who ran out of her medications and has no primary care doctor. Patient / Family / Caregiver informed of clinical course, understand medical decision-making process, and agree with plan. Procedures (including critical care time)  Labs Reviewed  BASIC METABOLIC PANEL - Abnormal; Notable for the following:    Sodium 131 (*)    Glucose, Bld 386 (*)    All other components within normal limits  PRO B NATRIURETIC PEPTIDE - Abnormal; Notable for the following:    Pro B Natriuretic peptide (BNP) 335.8 (*)    All other components within normal limits  CBC  D-DIMER, QUANTITATIVE  POCT I-STAT TROPONIN I   Dg Chest 2 View  05/04/2013   *RADIOLOGY REPORT*  Clinical Data: Difficulty breathing with chest tightness.  CHEST - 2 VIEW  Comparison: 02/13/2013  Findings: The lungs are clear without focal infiltrate, edema, pneumothorax or pleural  effusion. The cardiopericardial silhouette is within normal limits for size. Imaged bony structures of the thorax are intact.  IMPRESSION: No acute cardiopulmonary findings.   Original Report Authenticated By: Kennith Center, M.D.   1. Asthma exacerbation, mild intermittent   2. Hyperglycemia without ketosis     MDM  I doubt any other EMC precluding discharge at this time including, but not necessarily limited to the following:ACS, PE, sepsis,  DKA.  Hurman Horn, MD 05/04/13 1851  Hurman Horn, MD 05/04/13 [phone]

## 2013-05-04 NOTE — ED Notes (Signed)
Pt reports hx of asthma and bronchitis. States began having increased SOB yesterday and increasing difficulty breathing around 4am. States has run out of albuterol at home. Current smoker.

## 2013-05-28 ENCOUNTER — Ambulatory Visit: Payer: BC Managed Care – PPO

## 2013-06-11 ENCOUNTER — Ambulatory Visit: Payer: BC Managed Care – PPO | Attending: Family Medicine | Admitting: Internal Medicine

## 2013-06-11 ENCOUNTER — Encounter: Payer: Self-pay | Admitting: Obstetrics & Gynecology

## 2013-06-11 VITALS — BP 197/109 | HR 88 | Temp 98.2°F | Resp 16 | Wt 225.0 lb

## 2013-06-11 DIAGNOSIS — E119 Type 2 diabetes mellitus without complications: Secondary | ICD-10-CM

## 2013-06-11 DIAGNOSIS — E785 Hyperlipidemia, unspecified: Secondary | ICD-10-CM

## 2013-06-11 DIAGNOSIS — E1110 Type 2 diabetes mellitus with ketoacidosis without coma: Secondary | ICD-10-CM

## 2013-06-11 DIAGNOSIS — J45909 Unspecified asthma, uncomplicated: Secondary | ICD-10-CM

## 2013-06-11 DIAGNOSIS — I10 Essential (primary) hypertension: Secondary | ICD-10-CM | POA: Insufficient documentation

## 2013-06-11 DIAGNOSIS — E1310 Other specified diabetes mellitus with ketoacidosis without coma: Secondary | ICD-10-CM

## 2013-06-11 DIAGNOSIS — L732 Hidradenitis suppurativa: Secondary | ICD-10-CM | POA: Insufficient documentation

## 2013-06-11 LAB — CBC
Hemoglobin: 12.6 g/dL (ref 12.0–15.0)
MCH: 25.3 pg — ABNORMAL LOW (ref 26.0–34.0)
MCV: 79.2 fL (ref 78.0–100.0)
RBC: 4.99 MIL/uL (ref 3.87–5.11)
WBC: 6.8 10*3/uL (ref 4.0–10.5)

## 2013-06-11 LAB — LIPID PANEL
Cholesterol: 191 mg/dL (ref 0–200)
HDL: 43 mg/dL (ref >39)
Total CHOL/HDL Ratio: 4.4 Ratio
VLDL: 17 mg/dL (ref 0–40)

## 2013-06-11 LAB — COMPLETE METABOLIC PANEL WITH GFR
ALT: 11 U/L (ref 0–35)
AST: 10 U/L (ref 0–37)
Alkaline Phosphatase: 94 U/L (ref 39–117)
Creat: 0.6 mg/dL (ref 0.50–1.10)
Total Bilirubin: 0.2 mg/dL — ABNORMAL LOW (ref 0.3–1.2)

## 2013-06-11 LAB — GLUCOSE, POCT (MANUAL RESULT ENTRY): POC Glucose: 190 mg/dl — AB (ref 70–99)

## 2013-06-11 MED ORDER — GLIPIZIDE 10 MG PO TABS: 10.0000 mg | ORAL_TABLET | Freq: Two times a day (BID) | ORAL | Status: DC

## 2013-06-11 MED ORDER — HYDRALAZINE HCL 25 MG PO TABS: 25.0000 mg | ORAL_TABLET | Freq: Three times a day (TID) | ORAL | Status: DC

## 2013-06-11 MED ORDER — AMLODIPINE BESYLATE 2.5 MG PO TABS
10.0000 mg | ORAL_TABLET | Freq: Once | ORAL | Status: AC
  Administered 2013-06-11: 10 mg via ORAL

## 2013-06-11 MED ORDER — FREESTYLE SYSTEM KIT: 1.0000 | PACK | Freq: Three times a day (TID) | Status: DC

## 2013-06-11 MED ORDER — AMLODIPINE BESYLATE 10 MG PO TABS: 10.0000 mg | ORAL_TABLET | Freq: Every day | ORAL | Status: DC

## 2013-06-11 MED ORDER — METFORMIN HCL 1000 MG PO TABS: 1000.0000 mg | ORAL_TABLET | Freq: Two times a day (BID) | ORAL | Status: DC

## 2013-06-11 NOTE — Patient Instructions (Signed)
Accuchecks 4 times/day, Once in AM empty stomach and then before each meal. °Log in all results and show them to your Prim.MD in 3 days. °If any glucose reading is under 80 or above 300 call your Prim MD immidiately. °Follow Low glucose instructions for glucose under 80 as instructed. ° ° °

## 2013-06-11 NOTE — Progress Notes (Signed)
Patient here to establish care History of DM and HTN 

## 2013-06-11 NOTE — Progress Notes (Signed)
Patient ID: Caitlin Fritz, female   DOB: Jul 12, 1971, 42 y.o.   MRN: [id number]  Patient Demographics  Caitlin Fritz, is a 42 y.o. female  CSN: [id number]  MRN: [id number]  DOB - 1971/06/29  Outpatient Primary MD for the patient is Standley Dakins, MD   With History of -  Past Medical History  Diagnosis Date  . Hypertension   . Diabetes mellitus without complication   . Asthma       No past surgical history on file.  in for   Chief Complaint  Patient presents with  . Diabetes     HPI  Caitlin Fritz  is a 42 y.o. female, history of chronic intermittent asthma, diabetes mellitus type 2, hidradenitis and hypertension here to establish care, she is currently no subjective complaints, denies any fever chills, no headache chest or abdominal pain no shortness of breath.    Review of Systems    In addition to the HPI above,  No Fever-chills, No Headache, No changes with Vision or hearing, No problems swallowing food or Liquids, No Chest pain, Cough or Shortness of Breath, No Abdominal pain, No Nausea or Vommitting, Bowel movements are regular, No Blood in stool or Urine, No dysuria, No new skin rashes or bruises, No new joints pains-aches,  No new weakness, tingling, numbness in any extremity, No recent weight gain or loss, No polyuria, polydypsia or polyphagia, No significant Mental Stressors.  A full 10 point Review of Systems was done, except as stated above, all other Review of Systems were negative.   Social History History  Substance Use Topics  . Smoking status: Current Every Day Smoker  . Smokeless tobacco: Not on file  . Alcohol Use: Yes     Comment: occ      Family History Diabetes mellitus in other  Prior to Admission medications   Medication Sig Start Date End Date Taking? Authorizing Provider  aspirin EC 325 MG tablet Take 1 tablet (325 mg total) by mouth daily. 05/04/13  Yes Hurman Horn, MD  glipiZIDE (GLUCOTROL) 10 MG tablet  Take 1 tablet (10 mg total) by mouth 2 (two) times daily before a meal. 06/11/13  Yes Leroy Sea, MD  lisinopril (PRINIVIL,ZESTRIL) 20 MG tablet Take 1 tablet (20 mg total) by mouth daily. 04/22/13  Yes Brandt Loosen, MD  metFORMIN (GLUCOPHAGE) 1000 MG tablet Take 1 tablet (1,000 mg total) by mouth 2 (two) times daily. 05/04/13  Yes Hurman Horn, MD  albuterol (PROVENTIL HFA;VENTOLIN HFA) 108 (90 BASE) MCG/ACT inhaler Inhale 2 puffs into the lungs every 4 (four) hours as needed for wheezing. 02/13/13   Jennifer L Piepenbrink, PA-C  albuterol (PROVENTIL HFA;VENTOLIN HFA) 108 (90 BASE) MCG/ACT inhaler Inhale 2 puffs into the lungs every 2 (two) hours as needed for wheezing or shortness of breath (cough). 05/04/13   Hurman Horn, MD  amLODipine (NORVASC) 10 MG tablet Take 1 tablet (10 mg total) by mouth daily. 06/11/13   Leroy Sea, MD  Aspirin-Acetaminophen (GOODYS BODY PAIN PO) Take 1 packet by mouth 2 (two) times daily as needed (pain).     Historical Provider, MD  glucose monitoring kit (FREESTYLE) monitoring kit 1 each by Does not apply route 4 (four) times daily - after meals and at bedtime. 1 month Diabetic Testing Supplies for QAC-QHS accuchecks. 06/11/13   Leroy Sea, MD  hydrALAZINE (APRESOLINE) 25 MG tablet Take 1 tablet (25 mg total) by mouth 3 (three) times daily. 06/11/13   Leroy Sea,  MD  ibuprofen (ADVIL,MOTRIN) 200 MG tablet Take 1,000 mg by mouth every 8 (eight) hours as needed for pain.     Historical Provider, MD  predniSONE (DELTASONE) 20 MG tablet 2 tabs po daily x 3 days 05/04/13   Hurman Horn, MD    Allergies  Allergen Reactions  . Clonidine Derivatives Other (See Comments)    Seizures   . Prednisone Other (See Comments)    Eye drops only-temporary blindness     Physical Exam  Vitals  Blood pressure 197/109, pulse 88, temperature 98.2 F (36.8 C), resp. rate 16, weight 225 lb (102.059 kg), SpO2 100.00%.   1. General middle-aged African American female  sitting on clinic examination table in no apparent distress,     2. Normal affect and insight, Not Suicidal or Homicidal, Awake Alert, Oriented X 3.  3. No F.N deficits, ALL C.Nerves Intact, Strength 5/5 all 4 extremities, Sensation intact all 4 extremities, Plantars down going.  4. Ears and Eyes appear Normal, Conjunctivae clear, PERRLA. Moist Oral Mucosa.  5. Supple Neck, No JVD, No cervical lymphadenopathy appriciated, No Carotid Bruits.  6. Symmetrical Chest wall movement, Good air movement bilaterally, CTAB.  7. RRR, No Gallops, Rubs or Murmurs, No Parasternal Heave.  8. Positive Bowel Sounds, Abdomen Soft, Non tender, No organomegaly appriciated,No rebound -guarding or rigidity.  9.  No Cyanosis, Normal Skin Turgor, No Skin Rash or Bruise.  10. Good muscle tone,  joints appear normal , no effusions, Normal ROM.  11. No Palpable Lymph Nodes in Neck or Axillae     Data Review  CBC No results found for this basename: WBC, HGB, HCT, PLT, MCV, MCH, MCHC, RDW, NEUTRABS, LYMPHSABS, MONOABS, EOSABS, BASOSABS, BANDABS, BANDSABD,  in the last 168 hours ------------------------------------------------------------------------------------------------------------------  Chemistries  No results found for this basename: NA, K, CL, CO2, GLUCOSE, BUN, CREATININE, GFRCGP, CALCIUM, MG, AST, ALT, ALKPHOS, BILITOT,  in the last 168 hours ------------------------------------------------------------------------------------------------------------------ CrCl is unknown because both a height and weight (above a minimum accepted value) are required for this calculation. ------------------------------------------------------------------------------------------------------------------ No results found for this basename: TSH, T4TOTAL, FREET3, T3FREE, THYROIDAB,  in the last 72 hours   Coagulation profile No results found for this basename: INR, PROTIME,  in the last 168  hours ------------------------------------------------------------------------------------------------------------------- No results found for this basename: DDIMER,  in the last 72 hours -------------------------------------------------------------------------------------------------------------------  Cardiac Enzymes No results found for this basename: CK, CKMB, TROPONINI, MYOGLOBIN,  in the last 168 hours ------------------------------------------------------------------------------------------------------------------ No components found with this basename: POCBNP,    ---------------------------------------------------------------------------------------------------------------  Urinalysis No results found for this basename: colorurine, appearanceur, labspec, phurine, glucoseu, hgbur, bilirubinur, ketonesur, proteinur, urobilinogen, nitrite, leukocytesur       Assessment and plan  #1. Diabetes mellitus type 2. A1c is greater than 11, continue metformin at home dose I have doubled her Glucotrol, she has been provided with a glucometer and testing supplies and instructed to do Accu-Cheks q. a.c. at bedtime, she has been told to maintain a log book and bring it next visit in 2 weeks.     #2. Hypertension. In poor control, she was given 10 mg of Norvasc now, she has severe asthma so no beta blockers, will place her on Norvasc along with hydrazine in addition to her present dose lisinopril, she will come back in 2 weeks.     #3. Chronic intermittent asthma. Stable continue rescue inhalers as before.    #4. History of smoking. Consult to quit smoking.     Routine health maintenance.  Screening labs. CBC, CMP, TSH, A1c, lipid panel - ordered   Mammogram, Pap smear - referral made    Immunizations he refused flu shot, her last tetanus shot was 4 years ago        Leroy Sea M.D on 06/11/2013 at 11:59 AM

## 2013-06-12 LAB — TSH: TSH: 0.44 u[IU]/mL (ref 0.350–4.500)

## 2013-06-13 NOTE — Progress Notes (Signed)
Quick Note:  Please call in Lipitor 10mg  PO daily 1 month supply , 3 refills, patient to come back in 6 weeks for Lipid panel repeat. ______

## 2013-06-14 ENCOUNTER — Telehealth: Payer: Self-pay | Admitting: Emergency Medicine

## 2013-06-14 MED ORDER — ATORVASTATIN CALCIUM 10 MG PO TABS: 10.0000 mg | ORAL_TABLET | Freq: Every day | ORAL | Status: DC

## 2013-06-14 NOTE — Telephone Encounter (Signed)
LEFT MESSAGE FOR PT TO CALL BACK FOR LAB RESULTS/NEW SCRIPT

## 2013-06-14 NOTE — Telephone Encounter (Signed)
Message copied by Darlis Loan on Thu Jun 14, 2013  4:36 PM ------      Message from: [facility]      Created: Wed Jun 13, 2013  5:42 AM       Please call in Lipitor 10mg  PO daily 1 month supply , 3 refills, patient to come back in 6 weeks for Lipid panel repeat. ------

## 2013-06-19 ENCOUNTER — Telehealth: Payer: Self-pay | Admitting: Emergency Medicine

## 2013-06-19 NOTE — Telephone Encounter (Signed)
LEFT A MESSAGE TO CALL BACK WITH MEDS THAT NEED REFILLED

## 2013-06-25 ENCOUNTER — Ambulatory Visit: Payer: BC Managed Care – PPO | Attending: Family Medicine | Admitting: Internal Medicine

## 2013-06-25 ENCOUNTER — Encounter: Payer: Self-pay | Admitting: Internal Medicine

## 2013-06-25 ENCOUNTER — Encounter: Payer: Self-pay | Admitting: *Deleted

## 2013-06-25 VITALS — BP 150/98 | HR 85 | Temp 98.5°F | Resp 18 | Ht 65.0 in | Wt 225.0 lb

## 2013-06-25 DIAGNOSIS — J45909 Unspecified asthma, uncomplicated: Secondary | ICD-10-CM | POA: Insufficient documentation

## 2013-06-25 DIAGNOSIS — Z79899 Other long term (current) drug therapy: Secondary | ICD-10-CM | POA: Insufficient documentation

## 2013-06-25 DIAGNOSIS — E785 Hyperlipidemia, unspecified: Secondary | ICD-10-CM

## 2013-06-25 DIAGNOSIS — I83893 Varicose veins of bilateral lower extremities with other complications: Secondary | ICD-10-CM | POA: Insufficient documentation

## 2013-06-25 DIAGNOSIS — E119 Type 2 diabetes mellitus without complications: Secondary | ICD-10-CM | POA: Insufficient documentation

## 2013-06-25 DIAGNOSIS — I10 Essential (primary) hypertension: Secondary | ICD-10-CM | POA: Insufficient documentation

## 2013-06-25 DIAGNOSIS — Z09 Encounter for follow-up examination after completed treatment for conditions other than malignant neoplasm: Secondary | ICD-10-CM | POA: Insufficient documentation

## 2013-06-25 MED ORDER — ROSUVASTATIN CALCIUM 20 MG PO TABS: 20.0000 mg | ORAL_TABLET | Freq: Every day | ORAL | Status: DC

## 2013-06-25 MED ORDER — SULFAMETHOXAZOLE-TMP DS 800-160 MG PO TABS: 1.0000 | ORAL_TABLET | Freq: Two times a day (BID) | ORAL | Status: DC

## 2013-06-25 NOTE — Progress Notes (Signed)
Patient ID: Caitlin Fritz, female   DOB: 02-May-1971, 42 y.o.   MRN: [id number] Patient Demographics  Caitlin Fritz, is a 42 y.o. female  WUJ:[id number]  [id number]  DOB - 1971-09-11  Chief Complaint  Patient presents with  . Follow-up  . Hypertension  . Diabetes        Subjective:   Caitlin Fritz is a 42 y.o. female here today for a follow up visit. Complaining of pain on one of the varicose veins especially on the left leg. She claims she has noted those veins were long-time no fever just started 3 weeks ago and those are the ones that are painful. No fever, no abdominal swelling. No history of fibroid. She continue to smoke cigarette but now down to half a pack per day. She claims to be compliant with medications for hypertension and diabetes. She says she could not afford the prescribed simvastatin but she has a plan that can dispense rosuvastatin, wonders if this could be changed around Patient has No headache, No chest pain, No abdominal pain - No Nausea, No new weakness tingling or numbness, No Cough - SOB.  ALLERGIES:   Allergies  Allergen Reactions  . Clonidine Derivatives Other (See Comments)    Seizures   . Prednisone Other (See Comments)    Eye drops only-temporary blindness     PAST MEDICAL HISTORY: Past Medical History  Diagnosis Date  . Hypertension   . Diabetes mellitus without complication   . Asthma     MEDICATIONS AT HOME: Prior to Admission medications   Medication Sig Start Date End Date Taking? Authorizing Provider  amLODipine (NORVASC) 10 MG tablet Take 1 tablet (10 mg total) by mouth daily. 06/11/13  Yes Leroy Sea, MD  aspirin EC 325 MG tablet Take 1 tablet (325 mg total) by mouth daily. 05/04/13  Yes Hurman Horn, MD  glipiZIDE (GLUCOTROL) 10 MG tablet Take 1 tablet (10 mg total) by mouth 2 (two) times daily before a meal. 06/11/13  Yes Leroy Sea, MD  hydrALAZINE (APRESOLINE) 25 MG tablet Take 1 tablet (25 mg total) by  mouth 3 (three) times daily. 06/11/13  Yes Leroy Sea, MD  lisinopril (PRINIVIL,ZESTRIL) 20 MG tablet Take 1 tablet (20 mg total) by mouth daily. 04/22/13  Yes Brandt Loosen, MD  metFORMIN (GLUCOPHAGE) 1000 MG tablet Take 1 tablet (1,000 mg total) by mouth 2 (two) times daily. 06/11/13  Yes Leroy Sea, MD  albuterol (PROVENTIL HFA;VENTOLIN HFA) 108 (90 BASE) MCG/ACT inhaler Inhale 2 puffs into the lungs every 4 (four) hours as needed for wheezing. 02/13/13   Jennifer L Piepenbrink, PA-C  albuterol (PROVENTIL HFA;VENTOLIN HFA) 108 (90 BASE) MCG/ACT inhaler Inhale 2 puffs into the lungs every 2 (two) hours as needed for wheezing or shortness of breath (cough). 05/04/13   Hurman Horn, MD  Aspirin-Acetaminophen (GOODYS BODY PAIN PO) Take 1 packet by mouth 2 (two) times daily as needed (pain).     Historical Provider, MD  glucose monitoring kit (FREESTYLE) monitoring kit 1 each by Does not apply route 4 (four) times daily - after meals and at bedtime. 1 month Diabetic Testing Supplies for QAC-QHS accuchecks. 06/11/13   Leroy Sea, MD  ibuprofen (ADVIL,MOTRIN) 200 MG tablet Take 1,000 mg by mouth every 8 (eight) hours as needed for pain.     Historical Provider, MD  predniSONE (DELTASONE) 20 MG tablet 2 tabs po daily x 3 days 05/04/13   Hurman Horn, MD  rosuvastatin (CRESTOR) 20  MG tablet Take 1 tablet (20 mg total) by mouth daily. 06/25/13   Jeanann Lewandowsky, MD  sulfamethoxazole-trimethoprim (BACTRIM DS) 800-160 MG per tablet Take 1 tablet by mouth 2 (two) times daily. 06/25/13   Jeanann Lewandowsky, MD     Objective:   Filed Vitals:   06/25/13 1123  BP: 150/98  Pulse: 85  Temp: 98.5 F (36.9 C)  TempSrc: Oral  Resp: 18  Height: 5\' 5"  (1.651 m)  Weight: 225 lb (102.059 kg)  SpO2: 99%    Exam General appearance :Awake, alert, not in any distress. Speech Clear. Not toxic Looking HEENT: Atraumatic and Normocephalic, pupils equally reactive to light and accomodation Neck: supple, no JVD.  No cervical lymphadenopathy.  Chest:Good air entry bilaterally, no added sounds  CVS: S1 S2 regular, no murmurs.  Abdomen: Bowel sounds present, Non tender and not distended with no gaurding, rigidity or rebound. Extremities: B/L Lower Ext shows multiple varicose veins, few tender once Neurology: Awake alert, and oriented X 3, CN II-XII intact, Non focal Skin:No Rash Wounds:N/A   Data Review   CBC No results found for this basename: WBC, HGB, HCT, PLT, MCV, MCH, MCHC, RDW, NEUTRABS, LYMPHSABS, MONOABS, EOSABS, BASOSABS, BANDABS, BANDSABD,  in the last 168 hours  Chemistries   No results found for this basename: NA, K, CL, CO2, GLUCOSE, BUN, CREATININE, GFRCGP, CALCIUM, MG, AST, ALT, ALKPHOS, BILITOT,  in the last 168 hours ------------------------------------------------------------------------------------------------------------------ No results found for this basename: HGBA1C,  in the last 72 hours ------------------------------------------------------------------------------------------------------------------ No results found for this basename: CHOL, HDL, LDLCALC, TRIG, CHOLHDL, LDLDIRECT,  in the last 72 hours ------------------------------------------------------------------------------------------------------------------ No results found for this basename: TSH, T4TOTAL, FREET3, T3FREE, THYROIDAB,  in the last 72 hours ------------------------------------------------------------------------------------------------------------------ No results found for this basename: VITAMINB12, FOLATE, FERRITIN, TIBC, IRON, RETICCTPCT,  in the last 72 hours  Coagulation profile  No results found for this basename: INR, PROTIME,  in the last 168 hours    Assessment & Plan   Patient Active Problem List   Diagnosis Date Noted  . Diabetes 06/25/2013  . Dyslipidemia 06/25/2013  . Varicose veins of lower extremities with other complications 06/25/2013  . HTN (hypertension) 06/11/2013  .  Asthma, chronic 06/11/2013  . Hidrosadenitis 06/11/2013  . DM2 (diabetes mellitus, type 2) 06/11/2013     Plan: Continue current medications for hypertension and diabetes until next evaluation Change simvastatin to rosuvastatin 20 mg tablet by mouth daily on patient request due to cost Patient to bring blood glucose and blood pressure log to next visit Patient has been counseled about nutrition and exercise Patient extensively counseled on smoking cessation  I will obtain the venous duplex ultrasound of both legs for varicose veins  Bactrim DS for acute on chronic hydradenitis  Follow up in 4 weeks  The patient was given clear instructions to go to ER or return to medical center if symptoms don't improve, worsen or new problems develop. The patient verbalized understanding. The patient was told to call to get lab results if they haven't heard anything in the next week.    Jeanann Lewandowsky, MD, MHA, FACP [facility] and [facility] [place], [place] [phone]   06/25/2013, 12:18 PM

## 2013-06-25 NOTE — Progress Notes (Signed)
Pt here for f/u HTN,DIABETES meds with adjustments. States new dosage causing nausea,poor appetite. Pt requesting different cholesterol med

## 2013-07-03 ENCOUNTER — Telehealth: Payer: Self-pay | Admitting: Internal Medicine

## 2013-07-03 NOTE — Telephone Encounter (Signed)
Pt was here on 06/25/13 was told she would be contacted with appt date/time for doplar ultrasound but has not been contacted yet.  Pt prefers Mon/Tues appt because those are her days off from work.

## 2013-07-03 NOTE — Telephone Encounter (Signed)
PT LEFT MESSAGE WITH VASCULAR APPT FOR DOPPLERS 07/09/13 @ 930 AM [facility]

## 2013-07-09 ENCOUNTER — Ambulatory Visit (HOSPITAL_COMMUNITY)
Admission: RE | Admit: 2013-07-09 | Discharge: 2013-07-09 | Disposition: A | Discharge status: Home / Self Care | Payer: BC Managed Care – PPO | Source: Ambulatory Visit | Attending: Internal Medicine | Admitting: Internal Medicine

## 2013-07-09 ENCOUNTER — Telehealth: Payer: Self-pay | Admitting: Emergency Medicine

## 2013-07-09 DIAGNOSIS — M79609 Pain in unspecified limb: Secondary | ICD-10-CM | POA: Insufficient documentation

## 2013-07-09 DIAGNOSIS — I83893 Varicose veins of bilateral lower extremities with other complications: Secondary | ICD-10-CM

## 2013-07-09 NOTE — Telephone Encounter (Signed)
Pt given results but is concerned about continued breast pain from reoccurent abscess infections. States prescribed Bactrim not working. Can we call in different ATB.

## 2013-07-09 NOTE — Progress Notes (Signed)
VASCULAR LAB PRELIMINARY  PRELIMINARY  PRELIMINARY  PRELIMINARY  Bilateral lower extremity venous Dopplers completed.    Preliminary report:  There is no DVT or SVT noted in the bilateral lower extremities.  Seraj Dunnam, RVT 07/09/2013, 11:02 AM

## 2013-07-09 NOTE — Telephone Encounter (Signed)
Pt called with negative dopplers results

## 2013-07-09 NOTE — Telephone Encounter (Signed)
Message copied by Darlis Loan on Mon Jul 09, 2013  3:54 PM ------      Message from: Quentin Angst      Created: Mon Jul 09, 2013 12:58 PM       Please let patient know that her bilateral lower extremity venous duplex results shows no evidence of deep venous thrombosis or superficial thrombosis. ------

## 2013-07-24 ENCOUNTER — Encounter: Payer: Self-pay | Admitting: Internal Medicine

## 2013-07-24 ENCOUNTER — Ambulatory Visit: Payer: BC Managed Care – PPO | Attending: Internal Medicine | Admitting: Internal Medicine

## 2013-07-24 VITALS — BP 173/98 | HR 88 | Temp 99.1°F | Resp 16 | Wt 231.0 lb

## 2013-07-24 DIAGNOSIS — E1142 Type 2 diabetes mellitus with diabetic polyneuropathy: Secondary | ICD-10-CM

## 2013-07-24 DIAGNOSIS — I10 Essential (primary) hypertension: Secondary | ICD-10-CM

## 2013-07-24 DIAGNOSIS — E1149 Type 2 diabetes mellitus with other diabetic neurological complication: Secondary | ICD-10-CM

## 2013-07-24 DIAGNOSIS — E785 Hyperlipidemia, unspecified: Secondary | ICD-10-CM

## 2013-07-24 DIAGNOSIS — E1310 Other specified diabetes mellitus with ketoacidosis without coma: Secondary | ICD-10-CM | POA: Insufficient documentation

## 2013-07-24 DIAGNOSIS — E1140 Type 2 diabetes mellitus with diabetic neuropathy, unspecified: Secondary | ICD-10-CM

## 2013-07-24 DIAGNOSIS — E1110 Type 2 diabetes mellitus with ketoacidosis without coma: Secondary | ICD-10-CM | POA: Insufficient documentation

## 2013-07-24 DIAGNOSIS — L732 Hidradenitis suppurativa: Secondary | ICD-10-CM

## 2013-07-24 DIAGNOSIS — E119 Type 2 diabetes mellitus without complications: Secondary | ICD-10-CM

## 2013-07-24 LAB — GLUCOSE, POCT (MANUAL RESULT ENTRY): POC Glucose: 219 mg/dl — AB (ref 70–99)

## 2013-07-24 MED ORDER — GABAPENTIN 100 MG PO CAPS: 100.0000 mg | ORAL_CAPSULE | Freq: Three times a day (TID) | ORAL | Status: DC

## 2013-07-24 MED ORDER — AMLODIPINE BESYLATE 10 MG PO TABS: 10.0000 mg | ORAL_TABLET | Freq: Every day | ORAL | Status: DC

## 2013-07-24 MED ORDER — FAMOTIDINE 20 MG PO TABS: 20.0000 mg | ORAL_TABLET | Freq: Two times a day (BID) | ORAL | Status: DC

## 2013-07-24 MED ORDER — GLIPIZIDE 10 MG PO TABS: 10.0000 mg | ORAL_TABLET | Freq: Two times a day (BID) | ORAL | Status: DC

## 2013-07-24 MED ORDER — METFORMIN HCL 1000 MG PO TABS: 1000.0000 mg | ORAL_TABLET | Freq: Two times a day (BID) | ORAL | Status: DC

## 2013-07-24 MED ORDER — HYDRALAZINE HCL 25 MG PO TABS: 25.0000 mg | ORAL_TABLET | Freq: Three times a day (TID) | ORAL | Status: DC

## 2013-07-24 MED ORDER — DOXYCYCLINE HYCLATE 100 MG PO TABS: 100.0000 mg | ORAL_TABLET | Freq: Two times a day (BID) | ORAL | Status: DC

## 2013-07-24 NOTE — Progress Notes (Signed)
Patient ID: Caitlin Fritz, female   DOB: [birth year], 42 y.o.   MRN: [id number] Patient Demographics  Caitlin Fritz, is a 42 y.o. female  [id number]:[id number]  [id number]  DOB - 28-Jun-1971  Chief Complaint  Patient presents with  . Follow-up        Subjective:   Caitlin Fritz is a 42 y.o. female here today for a follow up visit. Patient is noncompliant with medications. She is here today to discuss the results of her venous duplex study to get medication refills., She has not taken her blood pressure of blood sugar medication today, blood pressure is high but she claims her maximum blood pressure of systolic 148 and diastolic 88 at home. She also claimed her blood sugar ranged between 104 on 180 at home. She claims she has tight tender exercise 3 times a week 30 minutes each time brisk walk. Her major complaint today is bilateral lower leg pain, mostly burning sensation, like pinpricks. No injury. Patient has No headache, No chest pain, No abdominal pain - No Nausea, No new weakness tingling or numbness, No Cough - SOB.  ALLERGIES: Allergies  Allergen Reactions  . Clonidine Derivatives Other (See Comments)    Seizures   . Prednisone Other (See Comments)    Eye drops only-temporary blindness     PAST MEDICAL HISTORY: Past Medical History  Diagnosis Date  . Hypertension   . Diabetes mellitus without complication   . Asthma     MEDICATIONS AT HOME: Prior to Admission medications   Medication Sig Start Date End Date Taking? Authorizing Provider  albuterol (PROVENTIL HFA;VENTOLIN HFA) 108 (90 BASE) MCG/ACT inhaler Inhale 2 puffs into the lungs every 4 (four) hours as needed for wheezing. 02/13/13  Yes Jennifer L Piepenbrink, PA-C  albuterol (PROVENTIL HFA;VENTOLIN HFA) 108 (90 BASE) MCG/ACT inhaler Inhale 2 puffs into the lungs every 2 (two) hours as needed for wheezing or shortness of breath (cough). 05/04/13  Yes Hurman Horn, MD  amLODipine (NORVASC) 10 MG tablet Take 1  tablet (10 mg total) by mouth daily. 07/24/13  Yes Jeanann Lewandowsky, MD  aspirin EC 325 MG tablet Take 1 tablet (325 mg total) by mouth daily. 05/04/13  Yes Hurman Horn, MD  glipiZIDE (GLUCOTROL) 10 MG tablet Take 1 tablet (10 mg total) by mouth 2 (two) times daily before a meal. 07/24/13  Yes Jeanann Lewandowsky, MD  glucose monitoring kit (FREESTYLE) monitoring kit 1 each by Does not apply route 4 (four) times daily - after meals and at bedtime. 1 month Diabetic Testing Supplies for QAC-QHS accuchecks. 06/11/13  Yes Leroy Sea, MD  hydrALAZINE (APRESOLINE) 25 MG tablet Take 1 tablet (25 mg total) by mouth 3 (three) times daily. 07/24/13  Yes Jeanann Lewandowsky, MD  ibuprofen (ADVIL,MOTRIN) 200 MG tablet Take 1,000 mg by mouth every 8 (eight) hours as needed for pain.    Yes Historical Provider, MD  metFORMIN (GLUCOPHAGE) 1000 MG tablet Take 1 tablet (1,000 mg total) by mouth 2 (two) times daily. 07/24/13  Yes Jeanann Lewandowsky, MD  Aspirin-Acetaminophen (GOODYS BODY PAIN PO) Take 1 packet by mouth 2 (two) times daily as needed (pain).     Historical Provider, MD  doxycycline (VIBRA-TABS) 100 MG tablet Take 1 tablet (100 mg total) by mouth 2 (two) times daily. 07/24/13   Jeanann Lewandowsky, MD  famotidine (PEPCID) 20 MG tablet Take 1 tablet (20 mg total) by mouth 2 (two) times daily. 07/24/13   Jeanann Lewandowsky, MD  gabapentin (NEURONTIN) 100 MG capsule Take  1 capsule (100 mg total) by mouth 3 (three) times daily. 07/24/13   Jeanann Lewandowsky, MD  predniSONE (DELTASONE) 20 MG tablet 2 tabs po daily x 3 days 05/04/13   Hurman Horn, MD  rosuvastatin (CRESTOR) 20 MG tablet Take 1 tablet (20 mg total) by mouth daily. 06/25/13   Jeanann Lewandowsky, MD  sulfamethoxazole-trimethoprim (BACTRIM DS) 800-160 MG per tablet Take 1 tablet by mouth 2 (two) times daily. 06/25/13   Jeanann Lewandowsky, MD     Objective:   Filed Vitals:   07/24/13 1004  BP: 173/98  Pulse: 88  Temp: 99.1 F (37.3 C)  TempSrc:  Oral  Resp: 16  Weight: 231 lb (104.781 kg)  SpO2: 98%    Exam General appearance : Awake, alert, not in any distress. Speech Clear. Not toxic looking HEENT: Atraumatic and Normocephalic, pupils equally reactive to light and accomodation Neck: supple, no JVD. No cervical lymphadenopathy.  Chest:Good air entry bilaterally, no added sounds  CVS: S1 S2 regular, no murmurs.  Abdomen: Bowel sounds present, Non tender and not distended with no gaurding, rigidity or rebound. Extremities: B/L Lower Ext shows no edema, both legs are warm to touch Neurology: Awake alert, and oriented X 3, CN II-XII intact, Non focal Skin:No Rash Wounds:N/A   Data Review   CBC No results found for this basename: WBC, HGB, HCT, PLT, MCV, MCH, MCHC, RDW, NEUTRABS, LYMPHSABS, MONOABS, EOSABS, BASOSABS, BANDABS, BANDSABD,  in the last 168 hours  Chemistries   No results found for this basename: NA, K, CL, CO2, GLUCOSE, BUN, CREATININE, GFRCGP, CALCIUM, MG, AST, ALT, ALKPHOS, BILITOT,  in the last 168 hours ------------------------------------------------------------------------------------------------------------------ No results found for this basename: HGBA1C,  in the last 72 hours ------------------------------------------------------------------------------------------------------------------ No results found for this basename: CHOL, HDL, LDLCALC, TRIG, CHOLHDL, LDLDIRECT,  in the last 72 hours ------------------------------------------------------------------------------------------------------------------ No results found for this basename: TSH, T4TOTAL, FREET3, T3FREE, THYROIDAB,  in the last 72 hours ------------------------------------------------------------------------------------------------------------------ No results found for this basename: VITAMINB12, FOLATE, FERRITIN, TIBC, IRON, RETICCTPCT,  in the last 72 hours  Coagulation profile  No results found for this basename: INR, PROTIME,  in the  last 168 hours    Assessment & Plan   Patient Active Problem List   Diagnosis Date Noted  . DM (diabetes mellitus) type 2, uncontrolled, with ketoacidosis 07/24/2013  . Type II or unspecified type diabetes mellitus without mention of complication, not stated as uncontrolled 07/24/2013  . Diabetic neuropathy, painful 07/24/2013  . Diabetes 06/25/2013  . Dyslipidemia 06/25/2013  . Varicose veins of lower extremities with other complications 06/25/2013  . HTN (hypertension) 06/11/2013  . Asthma, chronic 06/11/2013  . Hidrosadenitis 06/11/2013  . DM2 (diabetes mellitus, type 2) 06/11/2013     Plan:  Start gabapentin 100 mg tablet by mouth 3 times a day for possible diabetic neuropathy  Famotidine 20 mg tablet by mouth twice a day for GERD  Doxycycline 100 mg tablet by mouth twice a day for hydradenitis Refill the following medications:  Norvasc 10 mg tablet by mouth daily  Glipizide 10 mg tablet by mouth twice a day  Metformin 1000 mg tablet by mouth twice a day  Patient has been counseled extensively about nutrition and exercise Patient has been counseled extensively about smoking cessation Patient encouraged to be compliant with medications and nutrition regimen  Ambulatory referral to ophthalmology for diabetic retinopathy check  Follow up in 3 months or when necessary for lab draw   The patient was given clear instructions to go to  ER or return to medical center if symptoms don't improve, worsen or new problems develop. The patient verbalized understanding. The patient was told to call to get lab results if they haven't heard anything in the next week.    Jeanann Lewandowsky, MD, MHA, FACP, FAAP [facility] [place], [place] [phone]   07/24/2013, 10:53 AM

## 2013-07-24 NOTE — Progress Notes (Signed)
Pt is here for a f/u on DM and needing refills on meds Also would like meds for acid reflux Needing recent results from bilateral venous duplex C/o persistent pain on bilateral legs She is alert w/no signs of acute distress... Ambulated well to exam room w/steady gait

## 2013-07-24 NOTE — Patient Instructions (Signed)
Hypertension As your heart beats, it forces blood through your arteries. This force is your blood pressure. If the pressure is too high, it is called hypertension (HTN) or high blood pressure. HTN is dangerous because you may have it and not know it. High blood pressure may mean that your heart has to work harder to pump blood. Your arteries may be narrow or stiff. The extra work puts you at risk for heart disease, stroke, and other problems.  Blood pressure consists of two numbers, a higher number over a lower, 110/72, for example. It is stated as "110 over 72." The ideal is below 120 for the top number (systolic) and under 80 for the bottom (diastolic). Write down your blood pressure today. You should pay close attention to your blood pressure if you have certain conditions such as:  Heart failure.  Prior heart attack.  Diabetes  Chronic kidney disease.  Prior stroke.  Multiple risk factors for heart disease. To see if you have HTN, your blood pressure should be measured while you are seated with your arm held at the level of the heart. It should be measured at least twice. A one-time elevated blood pressure reading (especially in the Emergency Department) does not mean that you need treatment. There may be conditions in which the blood pressure is different between your right and left arms. It is important to see your caregiver soon for a recheck. Most people have essential hypertension which means that there is not a specific cause. This type of high blood pressure may be lowered by changing lifestyle factors such as:  Stress.  Smoking.  Lack of exercise.  Excessive weight.  Drug/tobacco/alcohol use.  Eating less salt. Most people do not have symptoms from high blood pressure until it has caused damage to the body. Effective treatment can often prevent, delay or reduce that damage. TREATMENT  When a cause has been identified, treatment for high blood pressure is directed at the  cause. There are a large number of medications to treat HTN. These fall into several categories, and your caregiver will help you select the medicines that are best for you. Medications may have side effects. You should review side effects with your caregiver. If your blood pressure stays high after you have made lifestyle changes or started on medicines,   Your medication(s) may need to be changed.  Other problems may need to be addressed.  Be certain you understand your prescriptions, and know how and when to take your medicine.  Be sure to follow up with your caregiver within the time frame advised (usually within two weeks) to have your blood pressure rechecked and to review your medications.  If you are taking more than one medicine to lower your blood pressure, make sure you know how and at what times they should be taken. Taking two medicines at the same time can result in blood pressure that is too low. SEEK IMMEDIATE MEDICAL CARE IF:  You develop a severe headache, blurred or changing vision, or confusion.  You have unusual weakness or numbness, or a faint feeling.  You have severe chest or abdominal pain, vomiting, or breathing problems. MAKE SURE YOU:   Understand these instructions.  Will watch your condition.  Will get help right away if you are not doing well or get worse. Document Released: 09/20/2005 Document Revised: 12/13/2011 Document Reviewed: 05/10/2008 [facility] Patient Information 2014 [place], [place]. Diabetic Neuropathy Diabetic neuropathy is a common complication caused by diabetes. Neuropathy is a term that  means nerve disease or damage. If your diabetes is uncontrolled and you have high blood glucose (sugar) levels, over time, this can lead to damage to nerves throughout your body. There are three types of diabetic neuropathy:   Peripheral.  Autonomic.  Focal. PERIPHERAL NEUROPATHY Peripheral neuropathy is the most common form of diabetic neuropathy. It  causes damage to the nerves of the feet and legs and eventually the hands and arms.  SYMPTOMS  Peripheral neuropathy occurs slowly over time. The peripheral nerves sense touch, hot and cold, and pain. When these nerves no longer work:   Your feet become numb.  You can no longer feel pressure or pain in your feet.  You may have burning, stabbing or aching pain. This can lead to:  Thick calluses over pressure areas.  Pressure sores.  Ulcers. Ulcers can become infected with germs (bacteria) and can even lead to infection in the bones of the feet. DIAGNOSIS  The diagnosis of diabetic neuropathy is difficult at best. Sensory function testing can be done with:  Light touch using a monofilament.  Vibration with tuning fork.  Sharp sensation with pin prick Other tests that can help diagnose neuropathy are:  Nerve Conduction Velocities (NCV). This checks the transmission of electrical current through a nerve.  Electromyography (EMG). This shows how muscles respond to electrical signals transmitted by nearby nerves.  Quantitative sensory testing, which is used to assess how your nerves respond to vibration and changes in temperature. AUTONOMIC NEUROPATHY The autonomic nervous system controls functions that you do not think about. Examples would be:   Heart beat.  Regulation of body temperature.  Blood pressure.  Urination.  Digestion.  Sweating.  Sexual function. SYMPTOMS  The symptoms of autonomic neuropathy vary depending on which nerves are affected.   There can be problems with digestion such as:  Feeling sick to your stomach (nausea).  Vomiting.  Bloating.  Constipation.  Diarrhea.  Abdominal pain.  Difficulty with urination may occur because of the inability to sense when your bladder is full. You may have urine leakage (incontinence) or inability to empty your bladder completely (retention).  Palpitations or a feeling of an abnormal heart beat.  Blood  pressure drops on arising (orthostatic hypotension). This can happen when you first sit up or stand up. It causes you to feel:  Dizzy.  Weak.  Faint.  Sexual functioning:  In men, inability to attain and maintain an erection.  In women, vaginal dryness and problems with decreased sexual desire and arousal. DIAGNOSIS  Diagnosis is often based on reported symptoms. Tell your medical caregiver if you experience:   Dizziness.  Constipation.  Diarrhea.  Inappropriate urination or inability to urinate.  Inability to get or maintain an erection. Tests that may be done include:  An EKG or Holter Monitor. These are tests that can help show problems with the heart rate or heart rhythm.  X-rays can be used to find if there are problems with your ability to properly empty food from your stomach into the small intestine after eating. FOCAL NEUROPATHY Focal neuropathy affects just one nerve tract and occurs suddenly. However, it usually improves by itself over time. It does not cause long term damage, and treatments are usually needed only until the problem improves. SYMPTOMS  Examples include:   Abnormal eye movements or abnormal alignment of both eyes.  Weakness in the wrist.  Foot drop, which results in inability to lift the foot properly. This causes abnormal walking or foot movement. DIAGNOSIS  Diagnosis is made based on your symptoms and what your caregiver finds on your exam. Other tests that may be done include:  Nerve Conduction Velocities (NCV). This checks the transmission of electrical current through a nerve.  Electromyography (EMG). This shows how muscles respond to electrical signals transmitted by nearby nerves.  Quantitative sensory testing, which is used to assess how your nerves respond to vibration and changes in temperature. TREATMENT Once nerve damage occurs it cannot be reversed. The goal of treatment is to keep the disease from getting worse. If it gets  worse, it will affect more nerve fibers. Controlling your blood (sugar) is the key. You will need to keep your blood glucose and A1c at the target range prescribed by your caregiver. Things that will help control blood glucose levels include:  Blood glucose monitoring.  Meal planning.  Physical activity.  Diabetes medication. Over time, maintaining lower blood glucose levels helps lessen symptoms. Sometimes, prescription pain medicine is needed. Focal neuropathy can be painful and unpredictable and occurs most often in older adults with diabetes.  SEEK MEDICAL CARE IF:   You develop peripheral nerve symptoms such as burning, numbness, or pain in your feet, legs or hands.  You develop autonomic nerve symptoms such as:  Dizziness.  Abnormal urinary control.  Inability to get an erection.  You develop focal nerve symptoms such as sudden abnormal eye movements or sudden foot drop. Document Released: 11/29/2001 Document Revised: 12/13/2011 Document Reviewed: 02/28/2009 [facility] Patient Information 2014 [place], [place]. Diabetes and Exercise Regular exercise is important and can help:   Control blood glucose (sugar).  Decrease blood pressure.    Control blood lipids (cholesterol, triglycerides).  Improve overall health. BENEFITS FROM EXERCISE  Improved fitness.  Improved flexibility.  Improved endurance.  Increased bone density.  Weight control.  Increased muscle strength.  Decreased body fat.  Improvement of the body's use of insulin, a hormone.  Increased insulin sensitivity.  Reduction of insulin needs.  Reduced stress and tension.  Helps you feel better. People with diabetes who add exercise to their lifestyle gain additional benefits, including:  Weight loss.  Reduced appetite.  Improvement of the body's use of blood glucose.  Decreased risk factors for heart disease:  Lowering of cholesterol and triglycerides.  Raising the level of good  cholesterol (high-density lipoproteins, HDL).  Lowering blood sugar.  Decreased blood pressure. TYPE 1 DIABETES AND EXERCISE  Exercise will usually lower your blood glucose.  If blood glucose is greater than 240 mg/dl, check urine ketones. If ketones are present, do not exercise.  Location of the insulin injection sites may need to be adjusted with exercise. Avoid injecting insulin into areas of the body that will be exercised. For example, avoid injecting insulin into:  The arms when playing tennis.  The legs when jogging. For more information, discuss this with your caregiver.  Keep a record of:  Food intake.  Type and amount of exercise.  Expected peak times of insulin action.  Blood glucose levels. Do this before, during, and after exercise. Review your records with your caregiver. This will help you to develop guidelines for adjusting food intake and insulin amounts.  TYPE 2 DIABETES AND EXERCISE  Regular physical activity can help control blood glucose.  Exercise is important because it may:  Increase the body's sensitivity to insulin.  Improve blood glucose control.  Exercise reduces the risk of heart disease. It decreases serum cholesterol and triglycerides. It also lowers blood pressure.  Those who take insulin or oral  hypoglycemic agents should watch for signs of hypoglycemia. These signs include dizziness, shaking, sweating, chills, and confusion.  Body water is lost during exercise. It must be replaced. This will help to avoid loss of body fluids (dehydration) or heat stroke. Be sure to talk to your caregiver before starting an exercise program to make sure it is safe for you. Remember, any activity is better than none.  Document Released: 12/11/2003 Document Revised: 12/13/2011 Document Reviewed: 03/27/2009 [facility] Patient Information 2014 [place], [place]. Gabapentin capsules or tablets What is this medicine? GABAPENTIN (GA ba pen tin) is used to control  partial seizures in adults with epilepsy. It is also used to treat certain types of nerve pain. This medicine may be used for other purposes; ask your health care provider or pharmacist if you have questions. What should I tell my health care provider before I take this medicine? They need to know if you have any of these conditions: -kidney disease -suicidal thoughts, plans, or attempt; a previous suicide attempt by you or a family member -an unusual or allergic reaction to gabapentin, other medicines, foods, dyes, or preservatives -pregnant or trying to get pregnant -breast-feeding How should I use this medicine? Take this medicine by mouth. Swallow it with a drink of water. Follow the directions on the prescription label. If this medicine upsets your stomach, take it with food or milk. Take your medicine at regular intervals. Do not take it more often than directed. If you are directed to break the 600 or 800 mg tablets in half as part of your dose, the extra half tablet should be used for the next dose. If you have not used the extra half tablet within 3 days, it should be thrown away. A special MedGuide will be given to you by the pharmacist with each prescription and refill. Be sure to read this information carefully each time. Talk to your pediatrician regarding the use of this medicine in children. Special care may be needed. Overdosage: If you think you have taken too much of this medicine contact a poison control center or emergency room at once. NOTE: This medicine is only for you. Do not share this medicine with others. What if I miss a dose? If you miss a dose, take it as soon as you can. If it is almost time for your next dose, take only that dose. Do not take double or extra doses. What may interact with this medicine? Do not take this medicine with any of the following medications: -other gabapentin products This medicine may also interact with the following  medications: -alcohol -antacids -antihistamines for allergy, cough and cold -certain medicines for anxiety or sleep -certain medicines for depression or psychotic disturbances -homatropine; hydrocodone -naproxen -narcotic medicines (opiates) for pain -phenothiazines like chlorpromazine, mesoridazine, prochlorperazine, thioridazine This list may not describe all possible interactions. Give your health care provider a list of all the medicines, herbs, non-prescription drugs, or dietary supplements you use. Also tell them if you smoke, drink alcohol, or use illegal drugs. Some items may interact with your medicine. What should I watch for while using this medicine? Visit your doctor or health care professional for regular checks on your progress. You may want to keep a record at home of how you feel your condition is responding to treatment. You may want to share this information with your doctor or health care professional at each visit. You should contact your doctor or health care professional if your seizures get worse or if you have  any new types of seizures. Do not stop taking this medicine or any of your seizure medicines unless instructed by your doctor or health care professional. Stopping your medicine suddenly can increase your seizures or their severity. Wear a medical identification bracelet or chain if you are taking this medicine for seizures, and carry a card that lists all your medications. You may get drowsy, dizzy, or have blurred vision. Do not drive, use machinery, or do anything that needs mental alertness until you know how this medicine affects you. To reduce dizzy or fainting spells, do not sit or stand up quickly, especially if you are an older patient. Alcohol can increase drowsiness and dizziness. Avoid alcoholic drinks. Your mouth may get dry. Chewing sugarless gum or sucking hard candy, and drinking plenty of water will help. The use of this medicine may increase the chance  of suicidal thoughts or actions. Pay special attention to how you are responding while on this medicine. Any worsening of mood, or thoughts of suicide or dying should be reported to your health care professional right away. Women who become pregnant while using this medicine may enroll in the [place] [employer] Antiepileptic Drug Pregnancy Registry by calling [phone]. This registry collects information about the safety of antiepileptic drug use during pregnancy. What side effects may I notice from receiving this medicine? Side effects that you should report to your doctor or health care professional as soon as possible: -allergic reactions like skin rash, itching or hives, swelling of the face, lips, or tongue -worsening of mood, thoughts or actions of suicide or dying Side effects that usually do not require medical attention (report to your doctor or health care professional if they continue or are bothersome): -constipation -difficulty walking or controlling muscle movements -dizziness -nausea -slurred speech -tiredness -tremors -weight gain This list may not describe all possible side effects. Call your doctor for medical advice about side effects. You may report side effects to FDA at 1-800-FDA-1088. Where should I keep my medicine? Keep out of reach of children. Store at room temperature between 15 and 30 degrees C (59 and 86 degrees F). Throw away any unused medicine after the expiration date. NOTE: This sheet is a summary. It may not cover all possible information. If you have questions about this medicine, talk to your doctor, pharmacist, or health care provider.  2013, [employer]. (10/13/2011 11:43:23 AM)

## 2013-07-26 ENCOUNTER — Encounter: Payer: BC Managed Care – PPO | Admitting: Obstetrics & Gynecology

## 2013-09-04 ENCOUNTER — Telehealth: Payer: Self-pay

## 2013-09-04 NOTE — Telephone Encounter (Signed)
[facility] Gave verbal order for test strips for 3 month supply With two refills

## 2013-09-24 ENCOUNTER — Encounter: Payer: Self-pay | Admitting: Internal Medicine

## 2013-09-24 ENCOUNTER — Ambulatory Visit: Payer: BC Managed Care – PPO | Attending: Internal Medicine | Admitting: Internal Medicine

## 2013-09-24 VITALS — BP 147/94 | HR 98 | Temp 99.0°F | Resp 14 | Ht 66.0 in | Wt 233.4 lb

## 2013-09-24 DIAGNOSIS — E1149 Type 2 diabetes mellitus with other diabetic neurological complication: Secondary | ICD-10-CM

## 2013-09-24 DIAGNOSIS — E1310 Other specified diabetes mellitus with ketoacidosis without coma: Secondary | ICD-10-CM

## 2013-09-24 DIAGNOSIS — E119 Type 2 diabetes mellitus without complications: Secondary | ICD-10-CM

## 2013-09-24 DIAGNOSIS — L732 Hidradenitis suppurativa: Secondary | ICD-10-CM

## 2013-09-24 DIAGNOSIS — E1110 Type 2 diabetes mellitus with ketoacidosis without coma: Secondary | ICD-10-CM

## 2013-09-24 DIAGNOSIS — I10 Essential (primary) hypertension: Secondary | ICD-10-CM

## 2013-09-24 DIAGNOSIS — E1142 Type 2 diabetes mellitus with diabetic polyneuropathy: Secondary | ICD-10-CM

## 2013-09-24 DIAGNOSIS — F4323 Adjustment disorder with mixed anxiety and depressed mood: Secondary | ICD-10-CM

## 2013-09-24 DIAGNOSIS — E1140 Type 2 diabetes mellitus with diabetic neuropathy, unspecified: Secondary | ICD-10-CM

## 2013-09-24 MED ORDER — GABAPENTIN 300 MG PO CAPS: 300.0000 mg | ORAL_CAPSULE | Freq: Three times a day (TID) | ORAL | Status: DC

## 2013-09-24 MED ORDER — ALPRAZOLAM 0.5 MG PO TABS: 0.5000 mg | ORAL_TABLET | Freq: Every day | ORAL | Status: DC

## 2013-09-24 MED ORDER — HYDRALAZINE HCL 25 MG PO TABS: 25.0000 mg | ORAL_TABLET | Freq: Three times a day (TID) | ORAL | Status: DC

## 2013-09-24 MED ORDER — DOXYCYCLINE HYCLATE 100 MG PO TABS: 100.0000 mg | ORAL_TABLET | Freq: Two times a day (BID) | ORAL | Status: DC

## 2013-09-24 NOTE — Progress Notes (Signed)
Pt is here for burning sensation in bilateral hands and legs, worsens while sitting, x6 days; numbness in bilateral arms and legs. Symptoms happen mostly during the day. No itching, vomiting today.

## 2013-09-24 NOTE — Progress Notes (Signed)
CSW met with patient and provided her with information to pursue counseling and emotional support through [facility] and [address], [place].   CSW will continue to follow as needed.  Beverly Sessions MSW, LCSW Duration - 20 minute

## 2013-09-24 NOTE — Progress Notes (Signed)
Patient ID: Caitlin Fritz, female   DOB: [birth year], 42 y.o.   MRN: [id number] Patient Demographics  Caitlin Fritz, is a 43 y.o. female  [id number]  [id number]  DOB - 04-Jan-1971  Chief Complaint  Patient presents with  . office visit        Subjective:   Caitlin Fritz is a 42 y.o. female here today for a follow up visit. Patient came she continue to have pain in her hands and feet, feels like pinprick sensations bilaterally. She thinks the gabapentin is helping but not as much as she would like. She continue to smoke but she said he is down to 5 cigarettes a day. She claims she is going through a lot of stress at home, patient is tearful during this encounter. She has lost 2 family members in the last 4 months, she's stressed out at work, and the pain is making her feel horrible everyday to the extent that she cannot sleep at night, and she is constantly worried about her health. She also has hydradenitis that flares up once in a while, right now she thinks she might have on her right axillary region. Patient has No headache, No chest pain, No abdominal pain - No Nausea, No new weakness tingling or numbness, No Cough - SOB.  ALLERGIES: Allergies  Allergen Reactions  . Clonidine Derivatives Other (See Comments)    Seizures   . Prednisone Other (See Comments)    Eye drops only-temporary blindness     PAST MEDICAL HISTORY: Past Medical History  Diagnosis Date  . Hypertension   . Diabetes mellitus without complication   . Asthma     MEDICATIONS AT HOME: Prior to Admission medications   Medication Sig Start Date End Date Taking? Authorizing Provider  albuterol (PROVENTIL HFA;VENTOLIN HFA) 108 (90 BASE) MCG/ACT inhaler Inhale 2 puffs into the lungs every 4 (four) hours as needed for wheezing. 02/13/13  Yes Jennifer L Piepenbrink, PA-C  albuterol (PROVENTIL HFA;VENTOLIN HFA) 108 (90 BASE) MCG/ACT inhaler Inhale 2 puffs into the lungs every 2 (two) hours as needed  for wheezing or shortness of breath (cough). 05/04/13  Yes Hurman Horn, MD  amLODipine (NORVASC) 10 MG tablet Take 1 tablet (10 mg total) by mouth daily. 07/24/13  Yes Jeanann Lewandowsky, MD  famotidine (PEPCID) 20 MG tablet Take 1 tablet (20 mg total) by mouth 2 (two) times daily. 07/24/13  Yes Jeanann Lewandowsky, MD  gabapentin (NEURONTIN) 300 MG capsule Take 1 capsule (300 mg total) by mouth 3 (three) times daily. 09/24/13  Yes Jeanann Lewandowsky, MD  glipiZIDE (GLUCOTROL) 10 MG tablet Take 1 tablet (10 mg total) by mouth 2 (two) times daily before a meal. 07/24/13  Yes Jeanann Lewandowsky, MD  glucose monitoring kit (FREESTYLE) monitoring kit 1 each by Does not apply route 4 (four) times daily - after meals and at bedtime. 1 month Diabetic Testing Supplies for QAC-QHS accuchecks. 06/11/13  Yes Leroy Sea, MD  hydrALAZINE (APRESOLINE) 25 MG tablet Take 1 tablet (25 mg total) by mouth 3 (three) times daily. 09/24/13  Yes Jeanann Lewandowsky, MD  metFORMIN (GLUCOPHAGE) 1000 MG tablet Take 1 tablet (1,000 mg total) by mouth 2 (two) times daily. 07/24/13  Yes Jeanann Lewandowsky, MD  ALPRAZolam Prudy Feeler) 0.5 MG tablet Take 1 tablet (0.5 mg total) by mouth at bedtime. 09/24/13   Jeanann Lewandowsky, MD  aspirin EC 325 MG tablet Take 1 tablet (325 mg total) by mouth daily. 05/04/13   Hurman Horn, MD  Aspirin-Acetaminophen (GOODYS BODY PAIN  PO) Take 1 packet by mouth 2 (two) times daily as needed (pain).     Historical Provider, MD  doxycycline (VIBRA-TABS) 100 MG tablet Take 1 tablet (100 mg total) by mouth 2 (two) times daily. 09/24/13   Jeanann Lewandowsky, MD  ibuprofen (ADVIL,MOTRIN) 200 MG tablet Take 1,000 mg by mouth every 8 (eight) hours as needed for pain.     Historical Provider, MD  rosuvastatin (CRESTOR) 20 MG tablet Take 1 tablet (20 mg total) by mouth daily. 06/25/13   Jeanann Lewandowsky, MD     Objective:   Filed Vitals:   09/24/13 0902  BP: 147/94  Pulse: 98  Temp: 99 F (37.2 C)  TempSrc:  Oral  Resp: 14  Height: 5\' 6"  (1.676 m)  Weight: 233 lb 6.4 oz (105.87 kg)  SpO2: 98%    Exam General appearance : Awake, alert, not in any distress. Speech Clear. Not toxic looking, morbidly obese, tearful HEENT: Atraumatic and Normocephalic, pupils equally reactive to light and accomodation Neck: supple, no JVD. No cervical lymphadenopathy.  Chest: Multiple hydradenitis on the bilateral axillary region, Good air entry bilaterally, no added sounds  CVS: S1 S2 regular, no murmurs.  Abdomen: Bowel sounds present, Non tender and not distended with no gaurding, rigidity or rebound. Extremities: B/L Lower Ext shows no edema, both legs are warm to touch Neurology: Awake alert, and oriented X 3, CN II-XII intact, Non focal Skin:No Rash Wounds:N/A   Data Review   CBC No results found for this basename: WBC, HGB, HCT, PLT, MCV, MCH, MCHC, RDW, NEUTRABS, LYMPHSABS, MONOABS, EOSABS, BASOSABS, BANDABS, BANDSABD,  in the last 168 hours  Chemistries   No results found for this basename: NA, K, CL, CO2, GLUCOSE, BUN, CREATININE, GFRCGP, CALCIUM, MG, AST, ALT, ALKPHOS, BILITOT,  in the last 168 hours ------------------------------------------------------------------------------------------------------------------ No results found for this basename: HGBA1C,  in the last 72 hours ------------------------------------------------------------------------------------------------------------------ No results found for this basename: CHOL, HDL, LDLCALC, TRIG, CHOLHDL, LDLDIRECT,  in the last 72 hours ------------------------------------------------------------------------------------------------------------------ No results found for this basename: TSH, T4TOTAL, FREET3, T3FREE, THYROIDAB,  in the last 72 hours ------------------------------------------------------------------------------------------------------------------ No results found for this basename: VITAMINB12, FOLATE, FERRITIN, TIBC, IRON,  RETICCTPCT,  in the last 72 hours  Coagulation profile  No results found for this basename: INR, PROTIME,  in the last 168 hours    Assessment & Plan   1. DM2 (diabetes mellitus, type 2) Continue metformin and glipizide at the current doses We'll repeat hemoglobin A1c in one month  2. HTN (hypertension) Continue - hydrALAZINE (APRESOLINE) 25 MG tablet; Take 1 tablet (25 mg total) by mouth 3 (three) times daily.  Dispense: 90 tablet; Refill: 3  3. Hidrosadenitis  - doxycycline (VIBRA-TABS) 100 MG tablet; Take 1 tablet (100 mg total) by mouth 2 (two) times daily.  Dispense: 20 tablet; Refill: 1  4. Diabetic neuropathy, painful  Increase the dose of gabapentin to 300 mg 3 times a day - gabapentin (NEURONTIN) 300 MG capsule; Take 1 capsule (300 mg total) by mouth 3 (three) times daily.  Dispense: 270 capsule; Refill: 3  5. Adjustment disorder with mixed anxiety and depressed mood Referral to a Child psychotherapist here in our clinic for counseling, patient was seen as a walk-in - ALPRAZolam (XANAX) 0.5 MG tablet; Take 1 tablet (0.5 mg total) by mouth at bedtime.  Dispense: 30 tablet; Refill: 0  Follow up in 3 months or when necessary  The patient was given clear instructions to go to ER or return to medical  center if symptoms don't improve, worsen or new problems develop. The patient verbalized understanding. The patient was told to call to get lab results if they haven't heard anything in the next week.    Jeanann Lewandowsky, MD, MHA, FACP, FAAP [facility] [place], [place] [phone]   09/24/2013, 9:39 AM

## 2013-09-24 NOTE — Patient Instructions (Signed)
Hypertension As your heart beats, it forces blood through your arteries. This force is your blood pressure. If the pressure is too high, it is called hypertension (HTN) or high blood pressure. HTN is dangerous because you may have it and not know it. High blood pressure may mean that your heart has to work harder to pump blood. Your arteries may be narrow or stiff. The extra work puts you at risk for heart disease, stroke, and other problems.  Blood pressure consists of two numbers, a higher number over a lower, 110/72, for example. It is stated as "110 over 72." The ideal is below 120 for the top number (systolic) and under 80 for the bottom (diastolic). Write down your blood pressure today. You should pay close attention to your blood pressure if you have certain conditions such as:  Heart failure.  Prior heart attack.  Diabetes  Chronic kidney disease.  Prior stroke.  Multiple risk factors for heart disease. To see if you have HTN, your blood pressure should be measured while you are seated with your arm held at the level of the heart. It should be measured at least twice. A one-time elevated blood pressure reading (especially in the Emergency Department) does not mean that you need treatment. There may be conditions in which the blood pressure is different between your right and left arms. It is important to see your caregiver soon for a recheck. Most people have essential hypertension which means that there is not a specific cause. This type of high blood pressure may be lowered by changing lifestyle factors such as:  Stress.  Smoking.  Lack of exercise.  Excessive weight.  Drug/tobacco/alcohol use.  Eating less salt. Most people do not have symptoms from high blood pressure until it has caused damage to the body. Effective treatment can often prevent, delay or reduce that damage. TREATMENT  When a cause has been identified, treatment for high blood pressure is directed at the  cause. There are a large number of medications to treat HTN. These fall into several categories, and your caregiver will help you select the medicines that are best for you. Medications may have side effects. You should review side effects with your caregiver. If your blood pressure stays high after you have made lifestyle changes or started on medicines,   Your medication(s) may need to be changed.  Other problems may need to be addressed.  Be certain you understand your prescriptions, and know how and when to take your medicine.  Be sure to follow up with your caregiver within the time frame advised (usually within two weeks) to have your blood pressure rechecked and to review your medications.  If you are taking more than one medicine to lower your blood pressure, make sure you know how and at what times they should be taken. Taking two medicines at the same time can result in blood pressure that is too low. SEEK IMMEDIATE MEDICAL CARE IF:  You develop a severe headache, blurred or changing vision, or confusion.  You have unusual weakness or numbness, or a faint feeling.  You have severe chest or abdominal pain, vomiting, or breathing problems. MAKE SURE YOU:   Understand these instructions.  Will watch your condition.  Will get help right away if you are not doing well or get worse. Document Released: 09/20/2005 Document Revised: 12/13/2011 Document Reviewed: 05/10/2008 [facility] Patient Information 2014 [place], [place]. Diabetic Neuropathy Diabetic neuropathy is a nerve disease or nerve damage that is caused by diabetes  mellitus. About half of all people with diabetes mellitus have some form of nerve damage. Nerve damage is more common in those who have had diabetes mellitus for many years and who generally have not had good control of their blood sugar (glucose) level. Diabetic neuropathy is a common complication of diabetes mellitus. There are three more common types of diabetic  neuropathy and a fourth type that is less common and less understood:   Peripheral neuropathy This is the most common type of diabetic neuropathy. It causes damage to the nerves of the feet and legs first and then eventually the hands and arms.The damage affects the ability to sense touch.  Autonomic neuropathy This type causes damage to the autonomic nervous system, which controls the following functions:  Heartbeat.  Body temperature.  Blood pressure.  Urination.  Digestion.  Sweating.  Sexual function.  Focal neuropathy Focal neuropathy can be painful and unpredictable and occurs most often in older adults with diabetes mellitus. It involves a specific nerve or one area and often comes on suddenly. It usually does not cause long-term problems.  Radiculoplexus neuropathy Sometimes called lumbosacral radiculoplexus neuropathy, radiculoplexus neuropathy affects the nerves of the thighs, hips, buttocks, or legs. It is more common in people with type 2 diabetes mellitus and in older men. It is characterized by debilitating pain, weakness, and atrophy, usually in the thigh muscles. CAUSES  The cause of peripheral, autonomic, and focal neuropathies is diabetes mellitus that is uncontrolled and high glucose levels. The cause of radiculoplexus neuropathy is unknown. However, it is thought to be caused by inflammation related to uncontrolled glucose levels. SIGNS AND SYMPTOMS  Peripheral Neuropathy Peripheral neuropathy develops slowly over time. When the nerves of the feet and legs no longer work there may be:   Burning, stabbing, or aching pain in the legs or feet.  Inability to feel pressure or pain in your feet. This can lead to:  Thick calluses over pressure areas.  Pressure sores.  Ulcers.  Foot deformities.  Reduced ability to feel temperature changes.  Muscle weakness. Autonomic Neuropathy The symptoms of autonomic neuropathy vary depending on which nerves are affected.  Symptoms may include:  Problems with digestion, such as:  Feeling sick to your stomach (nausea).  Vomiting.  Bloating.  Constipation.  Diarrhea.  Abdominal pain.  Difficulty with urination. This occurs if you lose your ability to sense when your bladder is full. Problems include:  Urine leakage (incontinence).  Inability to empty your bladder completely (retention).  Rapid or irregular heartbeat (palpitations).  Blood pressure drops when you stand up (orthostatic hypotension). When you stand up you may feel:  Dizzy.  Weak.  Faint.  In men, inability to attain and maintain an erection.  In women, vaginal dryness and problems with decreased sexual desire and arousal.  Problems with body temperature regulation.  Increased or decreased sweating. Focal Neuropathy  Abnormal eye movements or abnormal alignment of both eyes.  Weakness in the wrist.  Foot drop. This results in an inability to lift the foot properly and abnormal walking or foot movement.  Paralysis on one side of your face (Bell palsy).  Chest or abdominal pain. Radiculoplexus Neuropathy  Sudden, severe pain in your hip, thigh, or buttocks.  Weakness and wasting of thigh muscles.  Difficulty rising from a seated position.  Abdominal swelling.  Unexplained weight loss (usually more than 10 lb [4.5 kg]). DIAGNOSIS  Peripheral Neuropathy Your senses may be tested. Sensory function testing can be done with:  A light  touch using a monofilament.  A vibration with tuning fork.  A sharp sensation with a pin prick Other tests that can help diagnose neuropathy are:  Nerve conduction velocity. This test checks the transmission of an electrical current through a nerve.  Electromyography. This shows how muscles respond to electrical signals transmitted by nearby nerves.  Quantitative sensory testing. This is used to assess how your nerves respond to vibrations and changes in  temperature. Autonomic Neuropathy Diagnosis is often based on reported symptoms. Tell your health care provider if you experience:   Dizziness.   Constipation.   Diarrhea.   Inappropriate urination or inability to urinate.   Inability to get or maintain an erection.  Tests that may be done include:   Electrocardiography or Holter monitor. These are tests that can help show problems with the heart rate or heart rhythm.   An X-ray exam may be done. Focal Neuropathy Diagnosis is made based on your symptoms and what your health care provider finds during your exam. Other tests may be done. They may include:  Nerve conduction velocities. This checks the transmission of electrical current through a nerve.  Electromyography. This shows how muscles respond to electrical signals transmitted by nearby nerves.  Quantitative sensory testing. This test is used to assess how your nerves respond to vibration and changes in temperature. Radiculoplexus Neuropathy  Often the first thing is to eliminate any other issue or problems that might be the cause, as there is no stick test for diagnosis.  X-ray exam of your spine and lumbar region.  Spinal tap to rule out cancer.  MRI to rule out other lesions. TREATMENT  Once nerve damage occurs it cannot be reversed. The goal of treatment is to keep the disease or nerve damage from getting worse and affecting more nerve fibers. Controlling your blood glucose level is the key. Most people with radiculoplexus neuropathy see at least a partial improvement over time. You will need to keep your blood glucose and HbA1c levels in the target range determined by your health care provider. Things that help control blood glucose levels include:   Blood glucose monitoring.   Meal planning.   Physical activity.   Diabetes medicine.  Over time, maintaining lower blood glucose levels helps lessen symptoms. Sometimes, prescription pain medicine is  needed. HOME CARE INSTRUCTIONS:  Do not smoke.  Keep your blood glucose level in the range that you and your health care provider have determined acceptable for you.  Keep your blood pressure level in the range that you and your health care provider have determined acceptable for you.  Eat a well-balanced diet.  Be active every day.  Check your feet every day. SEEK MEDICAL CARE IF:   You have burning, stabbing, or aching pain in the legs or feet.  You are unable to feel pressure or pain in your feet.  You develop problems with digestion such as:  Nausea.  Vomiting.  Bloating.  Constipation.  Diarrhea.  Abdominal pain.  You have difficulty with urination, such as:  Incontinence.  Retention.  You have palpitations.  You develop orthostatic hypotension. When you stand up you may feel:  Dizzy.  Weak.  Faint.  You cannot attain and maintain an erection (in men).  You have vaginal dryness and problems with decreased sexual desire and arousal (in women).  You have severe pain in your thighs, legs, or buttocks.  You have unexplained weight loss. Document Released: 11/29/2001 Document Revised: 05/23/2013 Document Reviewed: 03/01/2013 [employer] Patient Information  2014 [employer].  

## 2013-10-22 ENCOUNTER — Ambulatory Visit: Payer: BC Managed Care – PPO | Admitting: Internal Medicine

## 2013-10-25 ENCOUNTER — Ambulatory Visit: Payer: BC Managed Care – PPO | Admitting: Internal Medicine

## 2013-10-29 ENCOUNTER — Ambulatory Visit: Payer: BC Managed Care – PPO | Admitting: Internal Medicine

## 2013-10-30 ENCOUNTER — Other Ambulatory Visit: Payer: Self-pay | Admitting: Emergency Medicine

## 2013-10-30 ENCOUNTER — Telehealth: Payer: Self-pay | Admitting: Emergency Medicine

## 2013-10-30 DIAGNOSIS — I10 Essential (primary) hypertension: Secondary | ICD-10-CM

## 2013-10-30 DIAGNOSIS — E1110 Type 2 diabetes mellitus with ketoacidosis without coma: Secondary | ICD-10-CM

## 2013-10-30 MED ORDER — GLIPIZIDE 10 MG PO TABS: 10.0000 mg | ORAL_TABLET | Freq: Two times a day (BID) | ORAL | Status: DC

## 2013-10-30 MED ORDER — METFORMIN HCL 1000 MG PO TABS: 1000.0000 mg | ORAL_TABLET | Freq: Two times a day (BID) | ORAL | Status: DC

## 2013-10-30 MED ORDER — AMLODIPINE BESYLATE 10 MG PO TABS: 10.0000 mg | ORAL_TABLET | Freq: Every day | ORAL | Status: DC

## 2013-10-30 NOTE — Telephone Encounter (Signed)
Pt called in requesting medication refill on Metformin,Norvasc and Glipizide. States she missed f/u appt. Medication refilled and appt acheduled 12/04/13 @ 1015 am

## 2013-11-02 ENCOUNTER — Emergency Department (HOSPITAL_COMMUNITY): Payer: BC Managed Care – PPO

## 2013-11-02 ENCOUNTER — Observation Stay (HOSPITAL_COMMUNITY)
Admission: EM | Admit: 2013-11-02 | Discharge: 2013-11-03 | Disposition: A | Discharge status: Home / Self Care | Payer: BC Managed Care – PPO | Attending: Internal Medicine | Admitting: Internal Medicine

## 2013-11-02 ENCOUNTER — Encounter (HOSPITAL_COMMUNITY): Payer: Self-pay | Admitting: Emergency Medicine

## 2013-11-02 DIAGNOSIS — R5381 Other malaise: Secondary | ICD-10-CM | POA: Insufficient documentation

## 2013-11-02 DIAGNOSIS — R209 Unspecified disturbances of skin sensation: Secondary | ICD-10-CM | POA: Insufficient documentation

## 2013-11-02 DIAGNOSIS — R519 Headache, unspecified: Secondary | ICD-10-CM

## 2013-11-02 DIAGNOSIS — E119 Type 2 diabetes mellitus without complications: Secondary | ICD-10-CM | POA: Insufficient documentation

## 2013-11-02 DIAGNOSIS — IMO0002 Reserved for concepts with insufficient information to code with codable children: Secondary | ICD-10-CM | POA: Insufficient documentation

## 2013-11-02 DIAGNOSIS — R200 Anesthesia of skin: Secondary | ICD-10-CM

## 2013-11-02 DIAGNOSIS — E1140 Type 2 diabetes mellitus with diabetic neuropathy, unspecified: Secondary | ICD-10-CM

## 2013-11-02 DIAGNOSIS — I83893 Varicose veins of bilateral lower extremities with other complications: Secondary | ICD-10-CM

## 2013-11-02 DIAGNOSIS — L732 Hidradenitis suppurativa: Secondary | ICD-10-CM

## 2013-11-02 DIAGNOSIS — G459 Transient cerebral ischemic attack, unspecified: Secondary | ICD-10-CM | POA: Diagnosis present

## 2013-11-02 DIAGNOSIS — R5383 Other fatigue: Secondary | ICD-10-CM

## 2013-11-02 DIAGNOSIS — R42 Dizziness and giddiness: Secondary | ICD-10-CM | POA: Insufficient documentation

## 2013-11-02 DIAGNOSIS — R0789 Other chest pain: Secondary | ICD-10-CM | POA: Insufficient documentation

## 2013-11-02 DIAGNOSIS — Z79899 Other long term (current) drug therapy: Secondary | ICD-10-CM | POA: Insufficient documentation

## 2013-11-02 DIAGNOSIS — F17200 Nicotine dependence, unspecified, uncomplicated: Secondary | ICD-10-CM | POA: Insufficient documentation

## 2013-11-02 DIAGNOSIS — F4323 Adjustment disorder with mixed anxiety and depressed mood: Secondary | ICD-10-CM

## 2013-11-02 DIAGNOSIS — J45909 Unspecified asthma, uncomplicated: Secondary | ICD-10-CM | POA: Insufficient documentation

## 2013-11-02 DIAGNOSIS — I10 Essential (primary) hypertension: Secondary | ICD-10-CM | POA: Insufficient documentation

## 2013-11-02 DIAGNOSIS — E1110 Type 2 diabetes mellitus with ketoacidosis without coma: Secondary | ICD-10-CM

## 2013-11-02 DIAGNOSIS — R51 Headache: Principal | ICD-10-CM | POA: Insufficient documentation

## 2013-11-02 DIAGNOSIS — E785 Hyperlipidemia, unspecified: Secondary | ICD-10-CM

## 2013-11-02 DIAGNOSIS — H539 Unspecified visual disturbance: Secondary | ICD-10-CM | POA: Insufficient documentation

## 2013-11-02 LAB — CBC
HEMATOCRIT: 35.7 % — AB (ref 36.0–46.0)
Hemoglobin: 11.9 g/dL — ABNORMAL LOW (ref 12.0–15.0)
MCH: 26.3 pg (ref 26.0–34.0)
MCHC: 33.3 g/dL (ref 30.0–36.0)
MCV: 79 fL (ref 78.0–100.0)
Platelets: 246 10*3/uL (ref 150–400)
RBC: 4.52 MIL/uL (ref 3.87–5.11)
RDW: 14.6 % (ref 11.5–15.5)
WBC: 6.4 10*3/uL (ref 4.0–10.5)

## 2013-11-02 LAB — BASIC METABOLIC PANEL WITH GFR
BUN: 8 mg/dL (ref 6–23)
CO2: 25 meq/L (ref 19–32)
Calcium: 8.8 mg/dL (ref 8.4–10.5)
Chloride: 96 meq/L (ref 96–112)
Creatinine, Ser: 0.51 mg/dL (ref 0.50–1.10)
GFR calc Af Amer: >90 mL/min
GFR calc non Af Amer: >90 mL/min
Glucose, Bld: 262 mg/dL — ABNORMAL HIGH (ref 70–99)
Potassium: 3.7 meq/L (ref 3.7–5.3)
Sodium: 133 meq/L — ABNORMAL LOW (ref 137–147)

## 2013-11-02 LAB — POCT I-STAT TROPONIN I
Troponin i, poc: 0 ng/mL (ref 0.00–0.08)
Troponin i, poc: 0.01 ng/mL (ref 0.00–0.08)

## 2013-11-02 LAB — GLUCOSE, CAPILLARY: Glucose-Capillary: 244 mg/dL — ABNORMAL HIGH (ref 70–99)

## 2013-11-02 MED ORDER — PROCHLORPERAZINE EDISYLATE 5 MG/ML IJ SOLN
10.0000 mg | Freq: Once | INTRAMUSCULAR | Status: AC
  Administered 2013-11-02: 10 mg via INTRAVENOUS
  Filled 2013-11-02: qty 2

## 2013-11-02 MED ORDER — DIAZEPAM 5 MG/ML IJ SOLN
5.0000 mg | Freq: Once | INTRAMUSCULAR | Status: AC
  Administered 2013-11-02: 5 mg via INTRAVENOUS
  Filled 2013-11-02: qty 2

## 2013-11-02 MED ORDER — MORPHINE SULFATE 4 MG/ML IJ SOLN
4.0000 mg | Freq: Once | INTRAMUSCULAR | Status: AC
  Administered 2013-11-02: 4 mg via INTRAVENOUS
  Filled 2013-11-02: qty 1

## 2013-11-02 MED ORDER — ASPIRIN 81 MG PO CHEW
324.0000 mg | CHEWABLE_TABLET | Freq: Once | ORAL | Status: AC
  Administered 2013-11-02: 324 mg via ORAL
  Filled 2013-11-02: qty 4

## 2013-11-02 MED ORDER — NITROGLYCERIN 0.4 MG SL SUBL
0.4000 mg | SUBLINGUAL_TABLET | SUBLINGUAL | Status: DC | PRN
  Administered 2013-11-02: 0.4 mg via SUBLINGUAL

## 2013-11-02 MED ORDER — DIPHENHYDRAMINE HCL 50 MG/ML IJ SOLN
25.0000 mg | Freq: Once | INTRAMUSCULAR | Status: AC
  Administered 2013-11-02: 25 mg via INTRAVENOUS
  Filled 2013-11-02: qty 1

## 2013-11-02 MED ORDER — SODIUM CHLORIDE 0.9 % IV BOLUS (SEPSIS)
1000.0000 mL | Freq: Once | INTRAVENOUS | Status: AC
Start: 2013-11-02 — End: 2013-11-02
  Administered 2013-11-02: 1000 mL via INTRAVENOUS

## 2013-11-02 NOTE — H&P (Signed)
[facility] History and Physical  Patient: Caitlin Fritz  VQX:[id number]  DOB: 30-Mar-1971  DOS: the patient was seen and examined on 11/02/2013 PCP: Angelica Chessman, MD  Chief Complaint: Right-sided numbness  HPI: Caitlin Fritz is a 43 y.o. female with Past medical history of hypertension, diabetes, asthma. [place]. The patient presented with complaints of right-sided numbness that she has initially noted 3:00 in the morning. Along with that she also had some numbness of the left side of the face and numbness of the right leg. She initially thought that her symptoms would go better and therefore she went to work she started having complaint of frontal headache which was dull ache. And therefore she was recommended to come to the hospital by her PCP. At the time of my evaluation she mentions that her symptoms have improved and she is at her baseline other than some lethargy. Along with the symptoms she also had some substernal chest pain which was felt like pressure and indigestion. The pain is also resolved. Pt denies any fever, chills, headache, cough, shortness of breath, orthopnea, PND, nausea, vomiting, abdominal pain, diarrhea, constipation, active bleeding, burning urination, dizziness, pedal edema.    Review of Systems: as mentioned in the history of present illness.  A Comprehensive review of the other systems is negative.  Past Medical History  Diagnosis Date  . Hypertension   . Diabetes mellitus without complication   . Asthma    History reviewed. No pertinent past surgical history. Social History:  reports that she has been smoking.  She does not have any smokeless tobacco history on file. She reports that she drinks alcohol. She reports that she does not use illicit drugs. Independent for most of her  ADL.  Allergies  Allergen Reactions  . Clonidine Derivatives Other (See Comments)    Seizures   . Prednisone Other (See Comments)     Eye drops only-temporary blindness     No family history on file.  Prior to Admission medications   Medication Sig Start Date End Date Taking? Authorizing Provider  albuterol (PROVENTIL HFA;VENTOLIN HFA) 108 (90 BASE) MCG/ACT inhaler Inhale 2 puffs into the lungs every 4 (four) hours as needed for wheezing. 02/13/13  Yes Jennifer L Piepenbrink, PA-C  ALPRAZolam (XANAX) 0.5 MG tablet Take 1 tablet (0.5 mg total) by mouth at bedtime. 09/24/13  Yes Angelica Chessman, MD  amLODipine (NORVASC) 10 MG tablet Take 1 tablet (10 mg total) by mouth daily. 10/30/13  Yes Angelica Chessman, MD  Aspirin-Acetaminophen (GOODYS BODY PAIN PO) Take 1 packet by mouth 2 (two) times daily as needed (pain).    Yes Historical Provider, MD  famotidine (PEPCID) 20 MG tablet Take 1 tablet (20 mg total) by mouth 2 (two) times daily. 07/24/13  Yes Angelica Chessman, MD  gabapentin (NEURONTIN) 300 MG capsule Take 1 capsule (300 mg total) by mouth 3 (three) times daily. 09/24/13  Yes Angelica Chessman, MD  glipiZIDE (GLUCOTROL) 10 MG tablet Take 1 tablet (10 mg total) by mouth 2 (two) times daily before a meal. 10/30/13  Yes Angelica Chessman, MD  ibuprofen (ADVIL,MOTRIN) 200 MG tablet Take 1,000 mg by mouth every 8 (eight) hours as needed for pain.    Yes Historical Provider, MD  metFORMIN (GLUCOPHAGE) 1000 MG tablet Take 1 tablet (1,000 mg total) by mouth 2 (two) times daily. 10/30/13  Yes Angelica Chessman, MD  glucose monitoring kit (FREESTYLE) monitoring kit 1 each by Does not apply route 4 (four) times daily -  after meals and at bedtime. 1 month Diabetic Testing Supplies for QAC-QHS accuchecks. 06/11/13   Thurnell Lose, MD  hydrALAZINE (APRESOLINE) 25 MG tablet Take 1 tablet (25 mg total) by mouth 3 (three) times daily. 09/24/13   Angelica Chessman, MD    Physical Exam: Filed Vitals:   11/02/13 2115 11/02/13 2126 11/02/13 2145 11/02/13 2214  BP: 150/98 150/98 158/95 157/77  Pulse: 76 81 82 81  Temp:      TempSrc:       Resp:  16    SpO2: 100% 100% 91% 99%    General: Alert, Awake and Oriented to Time, Place and Person. Appear in mild distress Eyes: PERRL ENT: Oral Mucosa clear moist. Neck: no JVD Cardiovascular: S1 and S2 Present, no Murmur, Peripheral Pulses Present Respiratory: Bilateral Air entry equal and Decreased, Clear to Auscultation,  no Crackles,no wheezes Abdomen: Bowel Sound Present, Soft and Non tender Skin: no Rash Extremities: no Pedal edema, no calf tenderness Neurologic: Mental status alert awake and oriented normal speech, Cranial Nerves pupils are reactive extraocular muscle movement intact, Motor strength bilaterally equal strength, Sensation present in light touch bilaterally, reflexes present, babinski negative, Cerebellar test normal finger-nose-finger.  Labs on Admission:  CBC:  Recent Labs Lab 11/02/13 1448  WBC 6.4  HGB 11.9*  HCT 35.7*  MCV 79.0  PLT 246    CMP     Component Value Date/Time   NA 133* 11/02/2013 1448   K 3.7 11/02/2013 1448   CL 96 11/02/2013 1448   CO2 25 11/02/2013 1448   GLUCOSE 262* 11/02/2013 1448   BUN 8 11/02/2013 1448   CREATININE 0.51 11/02/2013 1448   CREATININE 0.60 06/11/2013 1158   CALCIUM 8.8 11/02/2013 1448   PROT 7.0 06/11/2013 1158   ALBUMIN 3.8 06/11/2013 1158   AST 10 06/11/2013 1158   ALT 11 06/11/2013 1158   ALKPHOS 94 06/11/2013 1158   BILITOT 0.2* 06/11/2013 1158   GFRNONAA >90 11/02/2013 1448   GFRAA >90 11/02/2013 1448    No results found for this basename: LIPASE, AMYLASE,  in the last 168 hours No results found for this basename: AMMONIA,  in the last 168 hours  No results found for this basename: CKTOTAL, CKMB, CKMBINDEX, TROPONINI,  in the last 168 hours BNP (last 3 results)  Recent Labs  05/04/13 1040  PROBNP 335.8*    Radiological Exams on Admission: Dg Chest 2 View  11/02/2013   CLINICAL DATA:  Chest pain  EXAM: CHEST  2 VIEW  COMPARISON:  DG CHEST 2 VIEW dated 05/04/2013  FINDINGS: There is no focal parenchymal  opacity, pleural effusion, or pneumothorax. The heart and mediastinal contours are unremarkable.  The osseous structures are unremarkable.  IMPRESSION: No active cardiopulmonary disease.   Electronically Signed   By: Kathreen Devoid   On: 11/02/2013 16:08   Ct Head Wo Contrast  11/02/2013   CLINICAL DATA:  Right arm and facial numbness and left-sided chest pain and nausea.  EXAM: CT HEAD WITHOUT CONTRAST  TECHNIQUE: Contiguous axial images were obtained from the base of the skull through the vertex without intravenous contrast.  COMPARISON:  CT scan dated 04/22/2013 and 06/09/2006  FINDINGS: No mass lesion. No midline shift. No acute hemorrhage or hematoma. No extra-axial fluid collections. No evidence of acute infarction. Brain parenchyma is normal. No osseous abnormality. Streak artifacts across the pons.  IMPRESSION: Normal exam.   Electronically Signed   By: Rozetta Nunnery M.D.   On: 11/02/2013 14:56  Mr Brain Wo Contrast  11/02/2013   CLINICAL DATA:  Diabetes and hypertension. Numbness of the right arm.  EXAM: MRI HEAD WITHOUT CONTRAST  TECHNIQUE: Multiplanar, multiecho pulse sequences of the brain and surrounding structures were obtained without intravenous contrast.  COMPARISON:  Head CT same day.  FINDINGS: Diffusion imaging does not show any acute or subacute infarction. The brainstem and cerebellum are normal. The cerebral hemispheres show a few punctate foci of T2 and FLAIR signal within the white matter that could indicate the earliest manifestation of small vessel disease. No cortical or large vessel territory insult. No mass lesion, hemorrhage, hydrocephalus or extra-axial collection. No pituitary mass. No inflammatory sinus disease. No skull or skullbase lesion.  IMPRESSION: No acute finding. Normal study with the exception of a few punctate foci of T2 and FLAIR signal within the hemispheric white matter that could represent the earliest manifestation of small vessel change.   Electronically Signed    By: Nelson Chimes M.D.   On: 11/02/2013 21:00    EKG: Independently reviewed. normal sinus rhythm, nonspecific ST and T waves changes.  Assessment/Plan Principal Problem:   TIA (transient ischemic attack) Active Problems:   HTN (hypertension)   Asthma, chronic   DM2 (diabetes mellitus, type 2)   1. TIA (transient ischemic attack) The patient is presenting with complaints of right-sided numbness and left facial numbness. She has undergone a CT scan of the head as well as MRI of the brain and so far her workup has been negative. Neurology has recommended to complete the stroke workup and therefore the patient will be admitted for observation. PTOT consultation, telemetry, serial troponin, echocardiogram, carotid duplex will be done. I would also check hemoglobin A1c, lipid profile, urine toxicology and urinalysis.  2. Hypertension At present her blood pressure is stable I would continue her home antihypertensive from tomorrow  3. Diabetes mellitus Continue glipizide holding metformin placing the patient on sliding scale  4. Chronic asthma continue albuterol  Consults: Neurology  DVT Prophylaxis: subcutaneous Heparin Nutrition: N.p.o. advance as tolerated  Code Status: Full  Family Communication: Family was present at bedside, opportunity was given to ask question and all questions were answered satisfactorily at the time of interview. Disposition: Admitted to observation in telemetry unit.  Author: Berle Mull, MD [facility] Pager: [phone] 11/02/2013, 11:57 PM    If 7PM-7AM, please contact night-coverage [email or web address] Password [email or web address]

## 2013-11-02 NOTE — ED Notes (Signed)
Pt ambulated to the restroom.

## 2013-11-02 NOTE — ED Notes (Signed)
Took pts CBg it was 244 reported to [place].

## 2013-11-02 NOTE — ED Notes (Signed)
Rt arm numb since 3 am and at 1 pm got nauseated and felt like rt side of face was numb  and left sided cp

## 2013-11-02 NOTE — ED Provider Notes (Signed)
CSN: [id number]     Arrival date & time 11/02/13  1347 History   First MD Initiated Contact with Patient 11/02/13 1357     Chief Complaint  Patient presents with  . Chest Pain   (Consider location/radiation/quality/duration/timing/severity/associated sxs/prior Treatment) HPI Comments: 43 year old female with a history of diabetes and hypertension presents with numbness. The patient states that 11 hours prior to arrival she noticed that her right arm was numb at 3 AM this morning. Felt like she had slept on it wrong. Numbness has continued while she has at work. Has not gotten worse but has not gotten better. The patient states that about 2 hours ago she started having left facial numbness but no slurred speech or trouble speaking. She feels like she's also been having a sharp frontal headache during this time the last couple hours. No prior history of headaches like this. She's been feeling dizziness as well. Over the past 2 hours she is also been having atypical chest pain. She states that it started off like palpitations now is more like a sharp stabbing pain in her left chest. She denies any focal weakness but states she feels overall weak. She's not had any fevers or chills recently. No shortness of breath or cough. No diarrhea. No lower extremity symptoms.   Past Medical History  Diagnosis Date  . Hypertension   . Diabetes mellitus without complication   . Asthma    History reviewed. No pertinent past surgical history. No family history on file. History  Substance Use Topics  . Smoking status: Current Every Day Smoker  . Smokeless tobacco: Not on file  . Alcohol Use: Yes     Comment: occ   OB History   Grav Para Term Preterm Abortions TAB SAB Ect Mult Living                 Review of Systems  Constitutional: Negative for fever and chills.  Eyes: Positive for visual disturbance (feels like both of her eyes are blurry).  Respiratory: Negative for cough and shortness of breath.    Cardiovascular: Positive for chest pain.  Gastrointestinal: Negative for vomiting, abdominal pain and diarrhea.  Genitourinary: Negative for dysuria.  Neurological: Positive for dizziness, weakness, numbness and headaches. Negative for speech difficulty.  All other systems reviewed and are negative.    Allergies  Clonidine derivatives and Prednisone  Home Medications   Current Outpatient Rx  Name  Route  Sig  Dispense  Refill  . albuterol (PROVENTIL HFA;VENTOLIN HFA) 108 (90 BASE) MCG/ACT inhaler   Inhalation   Inhale 2 puffs into the lungs every 4 (four) hours as needed for wheezing.   3.7 g   1   . ALPRAZolam (XANAX) 0.5 MG tablet   Oral   Take 1 tablet (0.5 mg total) by mouth at bedtime.   30 tablet   0   . amLODipine (NORVASC) 10 MG tablet   Oral   Take 1 tablet (10 mg total) by mouth daily.   90 tablet   3   . Aspirin-Acetaminophen (GOODYS BODY PAIN PO)   Oral   Take 1 packet by mouth 2 (two) times daily as needed (pain).          . famotidine (PEPCID) 20 MG tablet   Oral   Take 1 tablet (20 mg total) by mouth 2 (two) times daily.   60 tablet   3   . gabapentin (NEURONTIN) 300 MG capsule   Oral   Take 1 capsule (  300 mg total) by mouth 3 (three) times daily.   270 capsule   3   . glipiZIDE (GLUCOTROL) 10 MG tablet   Oral   Take 1 tablet (10 mg total) by mouth 2 (two) times daily before a meal.   60 tablet   3   . ibuprofen (ADVIL,MOTRIN) 200 MG tablet   Oral   Take 1,000 mg by mouth every 8 (eight) hours as needed for pain.          . metFORMIN (GLUCOPHAGE) 1000 MG tablet   Oral   Take 1 tablet (1,000 mg total) by mouth 2 (two) times daily.   120 tablet   3   . glucose monitoring kit (FREESTYLE) monitoring kit   Does not apply   1 each by Does not apply route 4 (four) times daily - after meals and at bedtime. 1 month Diabetic Testing Supplies for QAC-QHS accuchecks.   1 each   1   . hydrALAZINE (APRESOLINE) 25 MG tablet   Oral   Take  1 tablet (25 mg total) by mouth 3 (three) times daily.   90 tablet   3    BP 148/77  Pulse 77  Temp(Src) 97.7 F (36.5 C) (Oral)  Resp 13  SpO2 98% Physical Exam  Nursing note and vitals reviewed. Constitutional: She is oriented to person, place, and time. She appears well-developed and well-nourished. No distress.  HENT:  Head: Normocephalic and atraumatic.  Right Ear: External ear normal.  Left Ear: External ear normal.  Nose: Nose normal.  Eyes: EOM are normal. Pupils are equal, round, and reactive to light. Right eye exhibits no discharge. Left eye exhibits no discharge.  Cardiovascular: Normal rate, regular rhythm and normal heart sounds.   Pulmonary/Chest: Effort normal and breath sounds normal. She has no wheezes. She has no rales. She exhibits tenderness.  Abdominal: Soft. There is no tenderness.  Neurological: She is alert and oriented to person, place, and time.  CN 2-12 grossly intact except for subjective decreased sensation to light touch on left side of face. 5/5 strength in all 4 extremities, equal bilaterally. Right ulnar fingers and distal arm subjectively numb to light sensation  Skin: Skin is warm and dry.    ED Course  Procedures (including critical care time) Labs Review Labs Reviewed  CBC - Abnormal; Notable for the following:    Hemoglobin 11.9 (*)    HCT 35.7 (*)    All other components within normal limits  BASIC METABOLIC PANEL - Abnormal; Notable for the following:    Sodium 133 (*)    Glucose, Bld 262 (*)    All other components within normal limits  GLUCOSE, CAPILLARY - Abnormal; Notable for the following:    Glucose-Capillary 244 (*)    All other components within normal limits  POCT I-STAT TROPONIN I   Imaging Review Dg Chest 2 View  11/02/2013   CLINICAL DATA:  Chest pain  EXAM: CHEST  2 VIEW  COMPARISON:  DG CHEST 2 VIEW dated 05/04/2013  FINDINGS: There is no focal parenchymal opacity, pleural effusion, or pneumothorax. The heart and  mediastinal contours are unremarkable.  The osseous structures are unremarkable.  IMPRESSION: No active cardiopulmonary disease.   Electronically Signed   By: Kathreen Devoid   On: 11/02/2013 16:08   Ct Head Wo Contrast  11/02/2013   CLINICAL DATA:  Right arm and facial numbness and left-sided chest pain and nausea.  EXAM: CT HEAD WITHOUT CONTRAST  TECHNIQUE: Contiguous axial images  were obtained from the base of the skull through the vertex without intravenous contrast.  COMPARISON:  CT scan dated 04/22/2013 and 06/09/2006  FINDINGS: No mass lesion. No midline shift. No acute hemorrhage or hematoma. No extra-axial fluid collections. No evidence of acute infarction. Brain parenchyma is normal. No osseous abnormality. Streak artifacts across the pons.  IMPRESSION: Normal exam.   Electronically Signed   By: Rozetta Nunnery M.D.   On: 11/02/2013 14:56    EKG Interpretation    Date/Time:  Friday November 02 2013 13:50:10 EST Ventricular Rate:  85 PR Interval:  144 QRS Duration: 80 QT Interval:  380 QTC Calculation: 452 R Axis:   50 Text Interpretation:  Normal sinus rhythm ST \T\ T wave abnormality, consider inferolateral ischemia Abnormal ECG No significant change since 05/04/13 Confirmed by Temprance Wyre  MD, Graceann Boileau ([id number]) on 11/02/2013 1:58:03 PM            MDM   1. Headache   2. Numbness   3. Atypical chest pain    Patient's chest pain is atypical. EKG is abnormal, but unchanged from 5 months ago. Doubt acute ischemia. Her HEART score puts her at low risk. Feel for this she could be evaluated as an outpatient. Will get 2nd troponin to r/o acute MI. PERC negative, doubt PE. I discussed her atypical neuro symptoms with neuro on call, Dr. Armida Sans, who does not feel this is c/w a stroke and recommends MRI to r/o CVA vs MS vs other pathology. Neuro will re-evaluate after MRI. Could also be complex migraine, will treat with compazine/benadryl. With her headache starting less than 2 hours ago and no blood on  CT, I have low suspicion for [facility]. MRI delayed because patient got anxious in scanner and was sent back here prior to meds being requested. Will give valium when she is to go to MRI. Care transferred with MRI pending.     Ephraim Hamburger, MD 11/02/13 [phone]

## 2013-11-02 NOTE — ED Notes (Signed)
PA at bedside.

## 2013-11-02 NOTE — ED Notes (Signed)
Pt still complaining of chest pain 7/10. Notified MD. Pt states pain is worse with movement.

## 2013-11-02 NOTE — ED Notes (Signed)
Pt transported to MRI 

## 2013-11-02 NOTE — ED Notes (Signed)
Pt had episode of chest pain 9/10. Notified MD and obtained EKG. Gave pt 1 nitro and 325mg  ASA. Will continue to monitor.

## 2013-11-02 NOTE — ED Notes (Signed)
Pt too anxious to go in MRI. Notified MD. Will give valium for her nerves and retry MRI.

## 2013-11-02 NOTE — ED Provider Notes (Signed)
Pt received from Dr. Regenia Skeeter.  Pt has HTN and diabetes.  Presented to ED w/ RUE and L facial paresthesias, among other complaints.  Subjective decreased sensation RUE and L face on initial exam.  Neuro consulted, MRI brain recommended and is negative.  Pt currently Asx and repeat neuro exam non-focal.  Consulted Dr. Doy Mince and based on patient's ABCD2 score, she recommends admission for TIA work up.  BP improved and VS otherwise stable.  Pt has had aspirin.  11:40 PM   Caitlin Macho, PA-C 11/02/13 [phone]

## 2013-11-02 NOTE — ED Notes (Signed)
Pt ambulated to restroom with slow, steady gait. 

## 2013-11-03 ENCOUNTER — Encounter (HOSPITAL_COMMUNITY): Payer: Self-pay | Admitting: Emergency Medicine

## 2013-11-03 DIAGNOSIS — I517 Cardiomegaly: Secondary | ICD-10-CM

## 2013-11-03 DIAGNOSIS — R51 Headache: Secondary | ICD-10-CM

## 2013-11-03 LAB — CBC WITH DIFFERENTIAL/PLATELET
Basophils Absolute: 0 10*3/uL (ref 0.0–0.1)
Basophils Relative: 0 % (ref 0–1)
EOS ABS: 0.2 10*3/uL (ref 0.0–0.7)
Eosinophils Relative: 2 % (ref 0–5)
HCT: 34.7 % — ABNORMAL LOW (ref 36.0–46.0)
HEMOGLOBIN: 11.4 g/dL — AB (ref 12.0–15.0)
LYMPHS ABS: 3.3 10*3/uL (ref 0.7–4.0)
Lymphocytes Relative: 46 % (ref 12–46)
MCH: 26.3 pg (ref 26.0–34.0)
MCHC: 32.9 g/dL (ref 30.0–36.0)
MCV: 80 fL (ref 78.0–100.0)
Monocytes Absolute: 0.4 10*3/uL (ref 0.1–1.0)
Monocytes Relative: 6 % (ref 3–12)
Neutro Abs: 3.4 10*3/uL (ref 1.7–7.7)
Neutrophils Relative %: 47 % (ref 43–77)
PLATELETS: 223 10*3/uL (ref 150–400)
RBC: 4.34 MIL/uL (ref 3.87–5.11)
RDW: 14.9 % (ref 11.5–15.5)
WBC: 7.2 10*3/uL (ref 4.0–10.5)

## 2013-11-03 LAB — HEMOGLOBIN A1C
Hgb A1c MFr Bld: 10.5 % — ABNORMAL HIGH (ref <5.7)
Mean Plasma Glucose: 255 mg/dL — ABNORMAL HIGH (ref <117)

## 2013-11-03 LAB — COMPREHENSIVE METABOLIC PANEL
ALT: 45 U/L — ABNORMAL HIGH (ref 0–35)
AST: 34 U/L (ref 0–37)
Albumin: 2.8 g/dL — ABNORMAL LOW (ref 3.5–5.2)
Alkaline Phosphatase: 84 U/L (ref 39–117)
BUN: 9 mg/dL (ref 6–23)
CALCIUM: 8.4 mg/dL (ref 8.4–10.5)
CO2: 23 mEq/L (ref 19–32)
Chloride: 101 mEq/L (ref 96–112)
Creatinine, Ser: 0.58 mg/dL (ref 0.50–1.10)
GFR calc Af Amer: >90 mL/min (ref >90)
GLUCOSE: 267 mg/dL — AB (ref 70–99)
Potassium: 4 mEq/L (ref 3.7–5.3)
Sodium: 138 mEq/L (ref 137–147)
Total Bilirubin: <0.2 mg/dL — ABNORMAL LOW (ref 0.3–1.2)
Total Protein: 6.8 g/dL (ref 6.0–8.3)

## 2013-11-03 LAB — GLUCOSE, CAPILLARY
GLUCOSE-CAPILLARY: 388 mg/dL — AB (ref 70–99)
GLUCOSE-CAPILLARY: 261 mg/dL — AB (ref 70–99)
GLUCOSE-CAPILLARY: 224 mg/dL — AB (ref 70–99)
Glucose-Capillary: 203 mg/dL — ABNORMAL HIGH (ref 70–99)

## 2013-11-03 LAB — URINALYSIS, ROUTINE W REFLEX MICROSCOPIC
Bilirubin Urine: NEGATIVE
Ketones, ur: NEGATIVE mg/dL
Leukocytes, UA: NEGATIVE
Nitrite: NEGATIVE
Protein, ur: NEGATIVE mg/dL
SPECIFIC GRAVITY, URINE: 1.027 (ref 1.005–1.030)
UROBILINOGEN UA: 0.2 mg/dL (ref 0.0–1.0)
pH: 7 (ref 5.0–8.0)

## 2013-11-03 LAB — LIPID PANEL
CHOL/HDL RATIO: 5.1 ratio
Cholesterol: 184 mg/dL (ref 0–200)
HDL: 36 mg/dL — AB (ref >39)
LDL CALC: 131 mg/dL — AB (ref 0–99)
TRIGLYCERIDES: 85 mg/dL (ref <150)
VLDL: 17 mg/dL (ref 0–40)

## 2013-11-03 LAB — RAPID URINE DRUG SCREEN, HOSP PERFORMED
Amphetamines: NOT DETECTED
Barbiturates: NOT DETECTED
Benzodiazepines: NOT DETECTED
Cocaine: NOT DETECTED
OPIATES: NOT DETECTED
TETRAHYDROCANNABINOL: NOT DETECTED

## 2013-11-03 LAB — URINE MICROSCOPIC-ADD ON

## 2013-11-03 LAB — TROPONIN I

## 2013-11-03 MED ORDER — ALBUTEROL SULFATE (2.5 MG/3ML) 0.083% IN NEBU: 2.5000 mg | INHALATION_SOLUTION | RESPIRATORY_TRACT | Status: DC | PRN

## 2013-11-03 MED ORDER — INSULIN ASPART 100 UNIT/ML ~~LOC~~ SOLN
0.0000 [IU] | Freq: Every day | SUBCUTANEOUS | Status: DC
  Administered 2013-11-03: 5 [IU] via SUBCUTANEOUS

## 2013-11-03 MED ORDER — ALBUTEROL SULFATE HFA 108 (90 BASE) MCG/ACT IN AERS: 2.0000 | INHALATION_SPRAY | RESPIRATORY_TRACT | Status: DC | PRN

## 2013-11-03 MED ORDER — GABAPENTIN 300 MG PO CAPS
300.0000 mg | ORAL_CAPSULE | Freq: Three times a day (TID) | ORAL | Status: DC
  Administered 2013-11-03 (×2): 300 mg via ORAL
  Filled 2013-11-03 (×3): qty 1

## 2013-11-03 MED ORDER — ASPIRIN EC 81 MG PO TBEC
81.0000 mg | DELAYED_RELEASE_TABLET | Freq: Every day | ORAL | Status: DC
  Administered 2013-11-03: 81 mg via ORAL
  Filled 2013-11-03: qty 1

## 2013-11-03 MED ORDER — INSULIN ASPART 100 UNIT/ML ~~LOC~~ SOLN
0.0000 [IU] | Freq: Three times a day (TID) | SUBCUTANEOUS | Status: DC
Start: 2013-11-03 — End: 2013-11-03
  Administered 2013-11-03 (×2): 5 [IU] via SUBCUTANEOUS
  Administered 2013-11-03: 8 [IU] via SUBCUTANEOUS

## 2013-11-03 MED ORDER — FAMOTIDINE 20 MG PO TABS
20.0000 mg | ORAL_TABLET | Freq: Two times a day (BID) | ORAL | Status: DC
  Administered 2013-11-03: 20 mg via ORAL
  Filled 2013-11-03 (×2): qty 1

## 2013-11-03 MED ORDER — ASPIRIN 81 MG PO TBEC: 81.0000 mg | DELAYED_RELEASE_TABLET | Freq: Every day | ORAL | Status: DC

## 2013-11-03 MED ORDER — ENOXAPARIN SODIUM 40 MG/0.4ML ~~LOC~~ SOLN
40.0000 mg | SUBCUTANEOUS | Status: DC
  Administered 2013-11-03: 40 mg via SUBCUTANEOUS
  Filled 2013-11-03: qty 0.4

## 2013-11-03 MED ORDER — ALPRAZOLAM 0.5 MG PO TABS: 0.5000 mg | ORAL_TABLET | Freq: Every day | ORAL | Status: DC

## 2013-11-03 MED ORDER — HYDRALAZINE HCL 25 MG PO TABS
25.0000 mg | ORAL_TABLET | Freq: Three times a day (TID) | ORAL | Status: DC
  Administered 2013-11-03 (×3): 25 mg via ORAL
  Filled 2013-11-03 (×4): qty 1

## 2013-11-03 MED ORDER — AMLODIPINE BESYLATE 10 MG PO TABS
10.0000 mg | ORAL_TABLET | Freq: Every day | ORAL | Status: DC
Start: 2013-11-03 — End: 2013-11-03
  Administered 2013-11-03: 10 mg via ORAL
  Filled 2013-11-03: qty 1

## 2013-11-03 MED ORDER — GLIPIZIDE 10 MG PO TABS
10.0000 mg | ORAL_TABLET | Freq: Two times a day (BID) | ORAL | Status: DC
  Administered 2013-11-03 (×2): 10 mg via ORAL
  Filled 2013-11-03 (×3): qty 1

## 2013-11-03 NOTE — Progress Notes (Signed)
Stroke Team Progress Note  HISTORY   43 y.o. female who reports that she awakened on the morning of 1/30 with numbness in her right hand. The numbness was in the last two fingers and radiated up the medial part of her forearm. Later in the morning she also noted numbness on the right side of her face. When the paresthesias did not resolve the patient presented to the ED for evaluation. She reports that by the time of presentation her symptoms resolved. Patient did have some complaint of headache as well that has resolved. She reports no similar previous symptoms. The patient is on no antiplatelet therapy at home   SUBJECTIVE Pt's symptoms improved and she is at her baseline rightnow.    OBJECTIVE Most recent Vital Signs: Filed Vitals:   11/03/13 0003 11/03/13 0054 11/03/13 0400 11/03/13 0840  BP: 145/69 150/74 140/80 163/96  Pulse: 84 74 68 77  Temp:  98.4 F (36.9 C) 98.2 F (36.8 C) 98.3 F (36.8 C)  TempSrc:  Oral Oral Oral  Resp: 18 18 18    Height:  5\' 4"  (1.626 m)    Weight:  103.964 kg (229 lb 3.2 oz)    SpO2: 100% 96% 96% 100%   CBG (last 3)   Recent Labs  11/02/13 1422 11/03/13 0101 11/03/13 0800  GLUCAP 244* 388* 261*    IV Fluid Intake:     MEDICATIONS  . ALPRAZolam  0.5 mg Oral QHS  . amLODipine  10 mg Oral Daily  . aspirin EC  81 mg Oral Daily  . enoxaparin (LOVENOX) injection  40 mg Subcutaneous Q24H  . famotidine  20 mg Oral BID  . gabapentin  300 mg Oral TID  . glipiZIDE  10 mg Oral BID AC  . hydrALAZINE  25 mg Oral TID  . insulin aspart  0-15 Units Subcutaneous TID WC  . insulin aspart  0-5 Units Subcutaneous QHS   PRN:  albuterol, nitroGLYCERIN  Diet:  Cardiac  liquids Activity:  BedrestUp with assistance DVT Prophylaxis:  scds  CLINICALLY SIGNIFICANT STUDIES Basic Metabolic Panel:  Recent Labs Lab 11/02/13 1448 11/03/13 0530  NA 133* 138  K 3.7 4.0  CL 96 101  CO2 25 23  GLUCOSE 262* 267*  BUN 8 9  CREATININE 0.51 0.58  CALCIUM 8.8  8.4   Liver Function Tests:  Recent Labs Lab 11/03/13 0530  AST 34  ALT 45*  ALKPHOS 84  BILITOT <0.2*  PROT 6.8  ALBUMIN 2.8*   CBC:  Recent Labs Lab 11/02/13 1448 11/03/13 0530  WBC 6.4 7.2  NEUTROABS  --  3.4  HGB 11.9* 11.4*  HCT 35.7* 34.7*  MCV 79.0 80.0  PLT 246 223   Coagulation: No results found for this basename: LABPROT, INR,  in the last 168 hours Cardiac Enzymes:  Recent Labs Lab 11/03/13 0400  TROPONINI <0.30   Urinalysis:  Recent Labs Lab 11/03/13 0050  COLORURINE YELLOW  LABSPEC 1.027  PHURINE 7.0  GLUCOSEU >1000*  HGBUR MODERATE*  BILIRUBINUR NEGATIVE  KETONESUR NEGATIVE  PROTEINUR NEGATIVE  UROBILINOGEN 0.2  NITRITE NEGATIVE  LEUKOCYTESUR NEGATIVE   Lipid Panel    Component Value Date/Time   CHOL 184 11/03/2013 0400   TRIG 85 11/03/2013 0400   HDL 36* 11/03/2013 0400   CHOLHDL 5.1 11/03/2013 0400   VLDL 17 11/03/2013 0400   LDLCALC 131* 11/03/2013 0400   HgbA1C  No results found for this basename: HGBA1C    Urine Drug Screen:  Component Value Date/Time   LABOPIA NONE DETECTED 11/03/2013 0050   COCAINSCRNUR NONE DETECTED 11/03/2013 0050   LABBENZ NONE DETECTED 11/03/2013 0050   AMPHETMU NONE DETECTED 11/03/2013 0050   THCU NONE DETECTED 11/03/2013 0050   LABBARB NONE DETECTED 11/03/2013 0050    Alcohol Level: No results found for this basename: ETH,  in the last 168 hours  Dg Chest 2 View  11/02/2013   CLINICAL DATA:  Chest pain  EXAM: CHEST  2 VIEW  COMPARISON:  DG CHEST 2 VIEW dated 05/04/2013  FINDINGS: There is no focal parenchymal opacity, pleural effusion, or pneumothorax. The heart and mediastinal contours are unremarkable.  The osseous structures are unremarkable.  IMPRESSION: No active cardiopulmonary disease.   Electronically Signed   By: Kathreen Devoid   On: 11/02/2013 16:08   Ct Head Wo Contrast  11/02/2013   CLINICAL DATA:  Right arm and facial numbness and left-sided chest pain and nausea.  EXAM: CT HEAD WITHOUT CONTRAST   TECHNIQUE: Contiguous axial images were obtained from the base of the skull through the vertex without intravenous contrast.  COMPARISON:  CT scan dated 04/22/2013 and 06/09/2006  FINDINGS: No mass lesion. No midline shift. No acute hemorrhage or hematoma. No extra-axial fluid collections. No evidence of acute infarction. Brain parenchyma is normal. No osseous abnormality. Streak artifacts across the pons.  IMPRESSION: Normal exam.   Electronically Signed   By: Rozetta Nunnery M.D.   On: 11/02/2013 14:56   Mr Brain Wo Contrast  11/02/2013   CLINICAL DATA:  Diabetes and hypertension. Numbness of the right arm.  EXAM: MRI HEAD WITHOUT CONTRAST  TECHNIQUE: Multiplanar, multiecho pulse sequences of the brain and surrounding structures were obtained without intravenous contrast.  COMPARISON:  Head CT same day.  FINDINGS: Diffusion imaging does not show any acute or subacute infarction. The brainstem and cerebellum are normal. The cerebral hemispheres show a few punctate foci of T2 and FLAIR signal within the white matter that could indicate the earliest manifestation of small vessel disease. No cortical or large vessel territory insult. No mass lesion, hemorrhage, hydrocephalus or extra-axial collection. No pituitary mass. No inflammatory sinus disease. No skull or skullbase lesion.  IMPRESSION: No acute finding. Normal study with the exception of a few punctate foci of T2 and FLAIR signal within the hemispheric white matter that could represent the earliest manifestation of small vessel change.   Electronically Signed   By: Nelson Chimes M.D.   On: 11/02/2013 21:00    CT of the brain  No acute intracranial pathology.   MRI of the brain  No acute pathology  MRA of the brain    2D Echocardiogram    Carotid Doppler  No hemodynamic stenosis prelim  CXR    EKG  normal EKG, normal sinus rhythm, unchanged from previous tracings. For complete results please see formal report.   Therapy Recommendations  pending  Physical Exam   Neurologic Examination:  Mental Status:  Alert, oriented, thought content appropriate. Speech fluent without evidence of aphasia. Able to follow 3 step commands without difficulty.  Cranial Nerves:  II: Discs flat bilaterally; Visual fields grossly normal, pupils equal, round, reactive to light and accommodation  III,IV, VI: ptosis not present, extra-ocular motions intact bilaterally  V,VII: smile symmetric, facial light touch sensation normal bilaterally  VIII: hearing normal bilaterally  IX,X: gag reflex present  XI: bilateral shoulder shrug  XII: midline tongue extension  Motor:  Right : Upper extremity 5/5 Left: Upper extremity 5/5  Lower  extremity 5/5 Lower extremity 5/5  Tone and bulk:normal tone throughout; no atrophy noted  Sensory: Pinprick and light touch intact throughout, bilaterally  Deep Tendon Reflexes: Trace in the upper extremities and absent in the lower extremities.  Plantars:  Right: downgoing Left: downgoing  Cerebellar:  normal finger-to-nose and normal heel-to-shin test  Gait: unable to test  CV: pulses palpable throughout    ASSESSMENT Ms. Caitlin Fritz is a 43 y.o. female  presenting with an episode of left arm and left facial numbness. Symptoms have now resolved. Patient with vascular risk factors that are poorly controlled. ABCD2 score of 4, therefore patient admitted for TIA work up. Head CT and MRI of the brain were reviewed and neither show evidence of an acute infarct. Pt did have HA prior to onset of symptoms, possibility of complicated migraine. Pt was on ASA but stopped recently because she ran out of it.     Hospital day # 1  TREATMENT/PLAN  Con't ASA  2DEcho  Carotid US complete prelim no hemodynamic stenosis   Once 2decho complete and pt is seen by PT feel safe she can be d/c from Neurological stand point.    Leotis Pain  11/03/2013 11:01 AM  I have personally obtained a history, examined the  patient, evaluated imaging results, and formulated the assessment and plan of care. I agree with the above.

## 2013-11-03 NOTE — Progress Notes (Signed)
D/c orders received;IV removed with gauze on, pt remains in stable condition, pt meds and instructions reviewed and given to pt; reviewed stoke/TIA d/c packet; pt d/c to home

## 2013-11-03 NOTE — Progress Notes (Signed)
Occupational Therapy Discharge Patient Details Name: Caitlin Fritz MRN: [id number] DOB: 1970-12-30 Today's Date: 11/03/2013 Time:  -     Patient discharged from OT services secondary to independent with PT [place] and no acute OT needs at this time.. OT spoke to PT [place] and pt near baseline at this time.  Please see latest therapy progress note for current level of functioning and progress toward goals.    Progress and discharge plan discussed with patient and/or caregiver: Patient/Caregiver agrees with plan  Ot to sign off please reorder if appropriate.     Peri Maris Pager: [phone]  11/03/2013, 9:45 AM

## 2013-11-03 NOTE — Evaluation (Addendum)
Physical Therapy Evaluation Patient Details Name: Caitlin Fritz MRN: [id number] DOB: [birth year] Today's Date: 11/03/2013 Time: 8527-7824 PT Time Calculation (min): 9 min  PT Assessment / Plan / Recommendation History of Present Illness  Caitlin Fritz is an 43 y.o. female who reports that she awakened on the morning of 1/30 with numbness in her right hand.  The numbness was in the last two fingers and radiated up the medial part of her forearm.  Later in the morning she also noted numbness on the right side of her face.  When the paresthesias did not resolve the patient presented to the ED for evaluation.  She reports that by the time of presentation her symptoms resolved.   Clinical Impression  Pt adm due to the above. Pt ambulating at baseline and no residual deficits noted. Pt reports numbness has resolved. Pt eager to go home. Pt is safe from mobility standpoint at this time. No further PT needs warranted at this time. Will sign off.     PT Assessment  Patent does not need any further PT services    Follow Up Recommendations  No PT follow up    Does the patient have the potential to tolerate intense rehabilitation      Barriers to Discharge        Equipment Recommendations  None recommended by PT    Recommendations for Other Services     Frequency      Precautions / Restrictions Precautions Precautions: None Restrictions Weight Bearing Restrictions: No   Pertinent Vitals/Pain Stable t/o session.       Mobility  Bed Mobility Overal bed mobility: Independent Transfers Overall transfer level: Independent Equipment used: None General transfer comment: pt demo good stability with transfers Ambulation/Gait Ambulation/Gait assistance: Independent Ambulation Distance (Feet): 250 Feet Assistive device: None Gait Pattern/deviations: WFL(Within Functional Limits) Gait velocity: WNL  Gait velocity interpretation: at or above normal speed for age/gender Stairs:  Yes Stairs assistance: Modified independent (Device/Increase time) Stair Management: Two rails;Alternating pattern;Forwards Number of Stairs: 2 General stair comments: pt demo good stability with stairs          PT Diagnosis:    PT Problem List:   PT Treatment Interventions:       PT Goals(Current goals can be found in the care plan section) Acute Rehab PT Goals Patient Stated Goal: to go home today PT Goal Formulation: No goals set, d/c therapy  Visit Information  Last PT Received On: 11/03/13 Assistance Needed: +1 History of Present Illness: Caitlin Fritz is an 43 y.o. female who reports that she awakened on the morning of 1/30 with numbness in her right hand.  The numbness was in the last two fingers and radiated up the medial part of her forearm.  Later in the morning she also noted numbness on the right side of her face.  When the paresthesias did not resolve the patient presented to the ED for evaluation.  She reports that by the time of presentation her symptoms resolved.        Prior [place] expects to be discharged to:: Private residence Living Arrangements: Spouse/significant other Available Help at Discharge: Family;Available PRN/intermittently Type of Home: House Home Access: Stairs to enter [employer] of Steps: 2 Entrance Stairs-Rails: Can reach both;Left;Right Home Layout: One level Home Equipment: None Prior Function Level of Independence: Independent Communication Communication: No difficulties Dominant Hand: Right    Cognition  Cognition Arousal/Alertness: Awake/alert Behavior During Therapy: WFL for tasks assessed/performed Overall Cognitive Status: Within  Functional Limits for tasks assessed    Extremity/Trunk Assessment Upper Extremity Assessment Upper Extremity Assessment: Overall WFL for tasks assessed Lower Extremity Assessment Lower Extremity Assessment: Overall WFL for tasks assessed Cervical  / Trunk Assessment Cervical / Trunk Assessment: Normal   Balance Balance Overall balance assessment: Independent  End of Session PT - End of Session Activity Tolerance: Patient tolerated treatment well Patient left: in bed;with call bell/phone within reach Nurse Communication: Mobility status  GP Functional Assessment Tool Used: clincial judgement  Functional Limitation: Mobility: Walking and moving around Mobility: Walking and Moving Around Current Status (O2703): 0 percent impaired, limited or restricted Mobility: Walking and Moving Around Goal Status (J0093): 0 percent impaired, limited or restricted Mobility: Walking and Moving Around Discharge Status [phone]): 0 percent impaired, limited or restricted   Gustavus Bryant,  [place] [phone] 11/03/2013, 9:56 AM

## 2013-11-03 NOTE — ED Provider Notes (Signed)
Medical screening examination/treatment/procedure(s) were performed by non-physician practitioner and as supervising physician I was immediately available for consultation/collaboration.      Ephraim Hamburger, MD 11/03/13 [phone]

## 2013-11-03 NOTE — Progress Notes (Signed)
Utilization Review Completed.   Aqil Goetting, RN, BSN Nurse Case Manager  

## 2013-11-03 NOTE — Progress Notes (Signed)
  Echocardiogram 2D Echocardiogram has been performed.  Mauricio Po 11/03/2013, 4:01 PM

## 2013-11-03 NOTE — Discharge Instructions (Signed)
STROKE/TIA DISCHARGE INSTRUCTIONS SMOKING Cigarette smoking nearly doubles your risk of having a stroke & is the single most alterable risk factor  If you smoke or have smoked in the last 12 months, you are advised to quit smoking for your health.  Most of the excess cardiovascular risk related to smoking disappears within a year of stopping.  Ask you doctor about anti-smoking medications  [place] Quit Line: 1-800-QUIT NOW  Free Smoking Cessation Classes [phone]  CHOLESTEROL Know your levels; limit fat & cholesterol in your diet  Lipid Panel     Component Value Date/Time   CHOL 184 11/03/2013 0400   TRIG 85 11/03/2013 0400   HDL 36* 11/03/2013 0400   CHOLHDL 5.1 11/03/2013 0400   VLDL 17 11/03/2013 0400   LDLCALC 131* 11/03/2013 0400      Many patients benefit from treatment even if their cholesterol is at goal.  Goal: Total Cholesterol (CHOL) less than 160  Goal:  Triglycerides (TRIG) less than 150  Goal:  HDL greater than 40  Goal:  LDL (LDLCALC) less than 100   BLOOD PRESSURE American Stroke Association blood pressure target is less that 120/80 mm/Hg  Your discharge blood pressure is:  BP: 145/95 mmHg  Monitor your blood pressure  Limit your salt and alcohol intake  Many individuals will require more than one medication for high blood pressure  DIABETES (A1c is a blood sugar average for last 3 months) Goal HGBA1c is under 7% (HBGA1c is blood sugar average for last 3 months)  Diabetes: Diagnosis of diabetes:  Your A1c:10.5 %    Lab Results  Component Value Date   HGBA1C 10.5* 11/03/2013     Your HGBA1c can be lowered with medications, healthy diet, and exercise.  Check your blood sugar as directed by your physician  Call your physician if you experience unexplained or low blood sugars.  PHYSICAL ACTIVITY/REHABILITATION Goal is 30 minutes at least 4 days per week  Activity: Increase activity slowly, Therapies: Return to work:   Activity decreases your risk of  heart attack and stroke and makes your heart stronger.  It helps control your weight and blood pressure; helps you relax and can improve your mood.  Participate in a regular exercise program.  Talk with your doctor about the best form of exercise for you (dancing, walking, swimming, cycling).  DIET/WEIGHT Goal is to maintain a healthy weight  Your discharge diet is: Cardiac thin liquids Your height is:  Height: 5\' 4"  (162.6 cm) Your current weight is: Weight: 103.964 kg (229 lb 3.2 oz) Your Body Mass Index (BMI) is:  BMI (Calculated): 39.4  Following the type of diet specifically designed for you will help prevent another stroke.  Your goal weight range is:  110-140lbs  Your goal Body Mass Index (BMI) is 19-24.  Healthy food habits can help reduce 3 risk factors for stroke:  High cholesterol, hypertension, and excess weight.  RESOURCES Stroke/Support Group:  Call [phone]   STROKE EDUCATION PROVIDED/REVIEWED AND GIVEN TO PATIENT Stroke warning signs and symptoms How to activate emergency medical system (call 911). Medications prescribed at discharge. Need for follow-up after discharge. Personal risk factors for stroke. Pneumonia vaccine given: No Flu vaccine given: No My questions have been answered, the writing is legible, and I understand these instructions.  I will adhere to these goals & educational materials that have been provided to me after my discharge from the hospital.

## 2013-11-03 NOTE — ED Notes (Signed)
Pt ambulated to restroom with slow, steady gait. 

## 2013-11-03 NOTE — Progress Notes (Signed)
VASCULAR LAB PRELIMINARY  PRELIMINARY  PRELIMINARY  PRELIMINARY  Carotid Dopplers completed.    Preliminary report:  1-39% ICA stenosis.  Vertebral artery flow is antegrade.  Cayleigh Paull, RVT 11/03/2013, 10:41 AM

## 2013-11-03 NOTE — Discharge Summary (Signed)
Physician Discharge Summary  Caitlin Fritz TDH:[id number] DOB: Feb 25, 1971 DOA: 11/02/2013  PCP: Angelica Chessman, MD  Admit date: 11/02/2013 Discharge date: 11/03/2013  Time spent: 45 minutes  Recommendations for Outpatient Follow-up:  -Will be discharged home today. -Advised to follow up with PCP in 2 weeks.   Discharge Diagnoses:  Principal Problem:   TIA (transient ischemic attack) Active Problems:   HTN (hypertension)   Asthma, chronic   DM2 (diabetes mellitus, type 2)   Discharge Condition: Stable and improved  Filed Weights   11/03/13 0054  Weight: 103.964 kg (229 lb 3.2 oz)    History of present illness:  Caitlin Fritz is a 43 y.o. female with Past medical history of hypertension, diabetes, asthma.  [place]  The patient presented with complaints of right-sided numbness that she has initially noted 3:00 in the morning. Along with that she also had some numbness of the left side of the face and numbness of the right leg. She initially thought that her symptoms would go better and therefore she went to work she started having complaint of frontal headache which was dull ache. And therefore she was recommended to come to the hospital by her PCP.  At the time of my evaluation she mentions that her symptoms have improved and she is at her baseline other than some lethargy.  Along with the symptoms she also had some substernal chest pain which was felt like pressure and indigestion. The pain is also resolved.  Pt denies any fever, chills, headache, cough, shortness of breath, orthopnea, PND, nausea, vomiting, abdominal pain, diarrhea, constipation, active bleeding, burning urination, dizziness, pedal edema. Hospitalist admission was requested.   Hospital Course:   TIA -Left arm and face numbness have resolved. -ECHO/Dopplers WNL. -Per PT/OT no further needs. -ASA for secondary stroke prevention. -Counseled on smoking  cessation.  HTN -Well controlled. -Continue home meds.  DM -Well controlled.  Procedures: ECHO:  Study Conclusions  Left ventricle: The cavity size was normal. Wall thickness was increased in a pattern of mild LVH. Systolic function was normal. The estimated ejection fraction was in the range of 60% to 65%. Wall motion was normal; there were no regional wall motion abnormalities. Left ventricular diastolic function parameters were normal.  Carotid Dopplers: Preliminary report: 1-39% ICA stenosis. Vertebral artery flow is antegrade.    Consultations:  Neurology  Discharge Instructions  Discharge Orders   Future Appointments Provider Department Dept Phone   12/04/2013 10:15 AM Angelica Chessman, MD [place] [phone]   Future Orders Complete By Expires   Discontinue IV  As directed    Increase activity slowly  As directed        Medication List    STOP taking these medications       GOODYS BODY PAIN PO     ibuprofen 200 MG tablet  Commonly known as:  ADVIL,MOTRIN      TAKE these medications       albuterol 108 (90 BASE) MCG/ACT inhaler  Commonly known as:  PROVENTIL HFA;VENTOLIN HFA  Inhale 2 puffs into the lungs every 4 (four) hours as needed for wheezing.     ALPRAZolam 0.5 MG tablet  Commonly known as:  XANAX  Take 1 tablet (0.5 mg total) by mouth at bedtime.     amLODipine 10 MG tablet  Commonly known as:  NORVASC  Take 1 tablet (10 mg total) by mouth daily.     aspirin 81 MG EC tablet  Take  1 tablet (81 mg total) by mouth daily.     famotidine 20 MG tablet  Commonly known as:  PEPCID  Take 1 tablet (20 mg total) by mouth 2 (two) times daily.     gabapentin 300 MG capsule  Commonly known as:  NEURONTIN  Take 1 capsule (300 mg total) by mouth 3 (three) times daily.     glipiZIDE 10 MG tablet  Commonly known as:  GLUCOTROL  Take 1 tablet (10 mg total) by mouth 2 (two) times daily before a meal.     glucose  monitoring kit monitoring kit  1 each by Does not apply route 4 (four) times daily - after meals and at bedtime. 1 month Diabetic Testing Supplies for QAC-QHS accuchecks.     hydrALAZINE 25 MG tablet  Commonly known as:  APRESOLINE  Take 1 tablet (25 mg total) by mouth 3 (three) times daily.     metFORMIN 1000 MG tablet  Commonly known as:  GLUCOPHAGE  Take 1 tablet (1,000 mg total) by mouth 2 (two) times daily.       Allergies  Allergen Reactions  . Clonidine Derivatives Other (See Comments)    Seizures   . Prednisone Other (See Comments)    Eye drops only-temporary blindness        Follow-up Information   Follow up with JEGEDE, OLUGBEMIGA, MD. Schedule an appointment as soon as possible for a visit in 2 weeks.   Specialty:  Internal Medicine   Contact information:   [place] [address] [phone]        The results of significant diagnostics from this hospitalization (including imaging, microbiology, ancillary and laboratory) are listed below for reference.    Significant Diagnostic Studies: Dg Chest 2 View  11/02/2013   CLINICAL DATA:  Chest pain  EXAM: CHEST  2 VIEW  COMPARISON:  DG CHEST 2 VIEW dated 05/04/2013  FINDINGS: There is no focal parenchymal opacity, pleural effusion, or pneumothorax. The heart and mediastinal contours are unremarkable.  The osseous structures are unremarkable.  IMPRESSION: No active cardiopulmonary disease.   Electronically Signed   By: Kathreen Devoid   On: 11/02/2013 16:08   Ct Head Wo Contrast  11/02/2013   CLINICAL DATA:  Right arm and facial numbness and left-sided chest pain and nausea.  EXAM: CT HEAD WITHOUT CONTRAST  TECHNIQUE: Contiguous axial images were obtained from the base of the skull through the vertex without intravenous contrast.  COMPARISON:  CT scan dated 04/22/2013 and 06/09/2006  FINDINGS: No mass lesion. No midline shift. No acute hemorrhage or hematoma. No extra-axial fluid collections. No evidence of  acute infarction. Brain parenchyma is normal. No osseous abnormality. Streak artifacts across the pons.  IMPRESSION: Normal exam.   Electronically Signed   By: Rozetta Nunnery M.D.   On: 11/02/2013 14:56   Mr Brain Wo Contrast  11/02/2013   CLINICAL DATA:  Diabetes and hypertension. Numbness of the right arm.  EXAM: MRI HEAD WITHOUT CONTRAST  TECHNIQUE: Multiplanar, multiecho pulse sequences of the brain and surrounding structures were obtained without intravenous contrast.  COMPARISON:  Head CT same day.  FINDINGS: Diffusion imaging does not show any acute or subacute infarction. The brainstem and cerebellum are normal. The cerebral hemispheres show a few punctate foci of T2 and FLAIR signal within the white matter that could indicate the earliest manifestation of small vessel disease. No cortical or large vessel territory insult. No mass lesion, hemorrhage, hydrocephalus or extra-axial collection. No pituitary mass.  No inflammatory sinus disease. No skull or skullbase lesion.  IMPRESSION: No acute finding. Normal study with the exception of a few punctate foci of T2 and FLAIR signal within the hemispheric white matter that could represent the earliest manifestation of small vessel change.   Electronically Signed   By: Nelson Chimes M.D.   On: 11/02/2013 21:00    Microbiology: No results found for this or any previous visit (from the past 240 hour(s)).   Labs: Basic Metabolic Panel:  Recent Labs Lab 11/02/13 1448 11/03/13 0530  NA 133* 138  K 3.7 4.0  CL 96 101  CO2 25 23  GLUCOSE 262* 267*  BUN 8 9  CREATININE 0.51 0.58  CALCIUM 8.8 8.4   Liver Function Tests:  Recent Labs Lab 11/03/13 0530  AST 34  ALT 45*  ALKPHOS 84  BILITOT <0.2*  PROT 6.8  ALBUMIN 2.8*   No results found for this basename: LIPASE, AMYLASE,  in the last 168 hours No results found for this basename: AMMONIA,  in the last 168 hours CBC:  Recent Labs Lab 11/02/13 1448 11/03/13 0530  WBC 6.4 7.2  NEUTROABS   --  3.4  HGB 11.9* 11.4*  HCT 35.7* 34.7*  MCV 79.0 80.0  PLT 246 223   Cardiac Enzymes:  Recent Labs Lab 11/03/13 0400  TROPONINI <0.30   BNP: BNP (last 3 results)  Recent Labs  05/04/13 1040  PROBNP 335.8*   CBG:  Recent Labs Lab 11/02/13 1422 11/03/13 0101 11/03/13 0800 11/03/13 1125 11/03/13 1633  GLUCAP 244* 388* 261* 224* 203*       Signed:  HERNANDEZ ACOSTA,ESTELA  [facility] Pager: [phone] 11/03/2013, 6:15 PM

## 2013-11-03 NOTE — ED Notes (Signed)
Attempted report x1. 

## 2013-11-03 NOTE — ED Notes (Signed)
Report attempted x2

## 2013-11-03 NOTE — Consult Note (Signed)
Referring Physician: Posey Pronto    Chief Complaint: Paresthesias  HPI: Caitlin Fritz is an 43 y.o. female who reports that she awakened on the morning of 1/30 with numbness in her right hand.  The numbness was in the last two fingers and radiated up the medial part of her forearm.  Later in the morning she also noted numbness on the right side of her face.  When the paresthesias did not resolve the patient presented to the ED for evaluation.  She reports that by the time of presentation her symptoms resolved.  Patient did have some complaint of headache as well that has resolved.  She reports no similar previous symptoms.  The patient is on no antiplatelet therapy at home.    Date last known well: Date: 11/01/2013 Time last known well: Time: 23:00 tPA Given: No: Outside treatment window  Past Medical History  Diagnosis Date  . Hypertension   . Diabetes mellitus without complication   . Asthma     History reviewed. No pertinent past surgical history.  Family history: Father died of cancer-type unknown.  Mother alive with hypertension  Social History:  reports that she has been smoking.  She does not have any smokeless tobacco history on file. She reports that she drinks alcohol. She reports that she does not use illicit drugs.  Allergies:  Allergies  Allergen Reactions  . Clonidine Derivatives Other (See Comments)    Seizures   . Prednisone Other (See Comments)    Eye drops only-temporary blindness     Medications:  I have reviewed the patient's current medications. Prior to Admission:  Prescriptions prior to admission  Medication Sig Dispense Refill  . albuterol (PROVENTIL HFA;VENTOLIN HFA) 108 (90 BASE) MCG/ACT inhaler Inhale 2 puffs into the lungs every 4 (four) hours as needed for wheezing.  3.7 g  1  . ALPRAZolam (XANAX) 0.5 MG tablet Take 1 tablet (0.5 mg total) by mouth at bedtime.  30 tablet  0  . amLODipine (NORVASC) 10 MG tablet Take 1 tablet (10 mg total) by mouth  daily.  90 tablet  3  . Aspirin-Acetaminophen (GOODYS BODY PAIN PO) Take 1 packet by mouth 2 (two) times daily as needed (pain).       . famotidine (PEPCID) 20 MG tablet Take 1 tablet (20 mg total) by mouth 2 (two) times daily.  60 tablet  3  . gabapentin (NEURONTIN) 300 MG capsule Take 1 capsule (300 mg total) by mouth 3 (three) times daily.  270 capsule  3  . glipiZIDE (GLUCOTROL) 10 MG tablet Take 1 tablet (10 mg total) by mouth 2 (two) times daily before a meal.  60 tablet  3  . ibuprofen (ADVIL,MOTRIN) 200 MG tablet Take 1,000 mg by mouth every 8 (eight) hours as needed for pain.       . metFORMIN (GLUCOPHAGE) 1000 MG tablet Take 1 tablet (1,000 mg total) by mouth 2 (two) times daily.  120 tablet  3  . glucose monitoring kit (FREESTYLE) monitoring kit 1 each by Does not apply route 4 (four) times daily - after meals and at bedtime. 1 month Diabetic Testing Supplies for QAC-QHS accuchecks.  1 each  1  . hydrALAZINE (APRESOLINE) 25 MG tablet Take 1 tablet (25 mg total) by mouth 3 (three) times daily.  90 tablet  3   Scheduled: . ALPRAZolam  0.5 mg Oral QHS  . amLODipine  10 mg Oral Daily  . aspirin EC  81 mg Oral Daily  . enoxaparin (LOVENOX)  injection  40 mg Subcutaneous Q24H  . famotidine  20 mg Oral BID  . gabapentin  300 mg Oral TID  . glipiZIDE  10 mg Oral BID AC  . hydrALAZINE  25 mg Oral TID  . insulin aspart  0-15 Units Subcutaneous TID WC  . insulin aspart  0-5 Units Subcutaneous QHS    ROS: History obtained from the patient  General ROS: negative for - chills, fatigue, fever, night sweats, weight gain or weight loss Psychological ROS: negative for - behavioral disorder, hallucinations, memory difficulties, mood swings or suicidal ideation Ophthalmic ROS: negative for - blurry vision, double vision, eye pain or loss of vision ENT ROS: negative for - epistaxis, nasal discharge, oral lesions, sore throat, tinnitus or vertigo Allergy and Immunology ROS: negative for - hives or  itchy/watery eyes Hematological and Lymphatic ROS: negative for - bleeding problems, bruising or swollen lymph nodes Endocrine ROS: negative for - galactorrhea, hair pattern changes, polydipsia/polyuria or temperature intolerance Respiratory ROS: negative for - cough, hemoptysis, shortness of breath or wheezing Cardiovascular ROS: negative for - chest pain, dyspnea on exertion, edema or irregular heartbeat Gastrointestinal ROS: negative for - abdominal pain, diarrhea, hematemesis, nausea/vomiting or stool incontinence Genito-Urinary ROS: negative for - dysuria, hematuria, incontinence or urinary frequency/urgency Musculoskeletal ROS: negative for - joint swelling or muscular weakness Neurological ROS: as noted in HPI Dermatological ROS: negative for rash and skin lesion changes  Physical Examination: Blood pressure 150/74, pulse 74, temperature 98.4 F (36.9 C), temperature source Oral, resp. rate 18, height _0  (1.626 m), weight 103.964 kg (229 lb 3.2 oz), last menstrual period 10/27/2013, SpO2 96.00%.  Neurologic Examination: Mental Status: Alert, oriented, thought content appropriate.  Speech fluent without evidence of aphasia.  Able to follow 3 step commands without difficulty. Cranial Nerves: II: Discs flat bilaterally; Visual fields grossly normal, pupils equal, round, reactive to light and accommodation III,IV, VI: ptosis not present, extra-ocular motions intact bilaterally V,VII: smile symmetric, facial light touch sensation normal bilaterally VIII: hearing normal bilaterally IX,X: gag reflex present XI: bilateral shoulder shrug XII: midline tongue extension Motor: Right : Upper extremity   5/5    Left:     Upper extremity   5/5  Lower extremity   5/5     Lower extremity   5/5 Tone and bulk:normal tone throughout; no atrophy noted Sensory: Pinprick and light touch intact throughout, bilaterally Deep Tendon Reflexes: Trace in the upper extremities and absent in the lower  extremities.   Plantars: Right: downgoing   Left: downgoing Cerebellar: normal finger-to-nose and normal heel-to-shin test Gait: unable to test CV: pulses palpable throughout   Laboratory Studies:  Basic Metabolic Panel:  Recent Labs Lab 11/02/13 1448  NA 133*  K 3.7  CL 96  CO2 25  GLUCOSE 262*  BUN 8  CREATININE 0.51  CALCIUM 8.8    Liver Function Tests: No results found for this basename: AST, ALT, ALKPHOS, BILITOT, PROT, ALBUMIN,  in the last 168 hours No results found for this basename: LIPASE, AMYLASE,  in the last 168 hours No results found for this basename: AMMONIA,  in the last 168 hours  CBC:  Recent Labs Lab 11/02/13 1448  WBC 6.4  HGB 11.9*  HCT 35.7*  MCV 79.0  PLT 246    Cardiac Enzymes: No results found for this basename: CKTOTAL, CKMB, CKMBINDEX, TROPONINI,  in the last 168 hours  BNP: No components found with this basename: POCBNP,   CBG:  Recent Labs Lab 11/02/13 1422 11/03/13  [place]    Microbiology: No results found for this or any previous visit.  Coagulation Studies: No results found for this basename: LABPROT, INR,  in the last 72 hours  Urinalysis:  Recent Labs Lab 11/03/13 0050  COLORURINE YELLOW  LABSPEC 1.027  PHURINE 7.0  GLUCOSEU >1000*  HGBUR MODERATE*  BILIRUBINUR NEGATIVE  KETONESUR NEGATIVE  PROTEINUR NEGATIVE  UROBILINOGEN 0.2  NITRITE NEGATIVE  LEUKOCYTESUR NEGATIVE    Lipid Panel:    Component Value Date/Time   CHOL 191 06/11/2013 1158   TRIG 83 06/11/2013 1158   HDL 43 06/11/2013 1158   CHOLHDL 4.4 06/11/2013 1158   VLDL 17 06/11/2013 1158   LDLCALC 131* 06/11/2013 1158    HgbA1C:  No results found for this basename: HGBA1C    Urine Drug Screen:     Component Value Date/Time   LABOPIA NONE DETECTED 11/03/2013 0050   COCAINSCRNUR NONE DETECTED 11/03/2013 0050   LABBENZ NONE DETECTED 11/03/2013 0050   AMPHETMU NONE DETECTED 11/03/2013 0050   THCU NONE DETECTED 11/03/2013 0050    LABBARB NONE DETECTED 11/03/2013 0050    Alcohol Level: No results found for this basename: ETH,  in the last 168 hours  Other results: EKG: sinus rhythm at 78 bpm.  Imaging: Dg Chest 2 View  11/02/2013   CLINICAL DATA:  Chest pain  EXAM: CHEST  2 VIEW  COMPARISON:  DG CHEST 2 VIEW dated 05/04/2013  FINDINGS: There is no focal parenchymal opacity, pleural effusion, or pneumothorax. The heart and mediastinal contours are unremarkable.  The osseous structures are unremarkable.  IMPRESSION: No active cardiopulmonary disease.   Electronically Signed   By: Kathreen Devoid   On: 11/02/2013 16:08   Ct Head Wo Contrast  11/02/2013   CLINICAL DATA:  Right arm and facial numbness and left-sided chest pain and nausea.  EXAM: CT HEAD WITHOUT CONTRAST  TECHNIQUE: Contiguous axial images were obtained from the base of the skull through the vertex without intravenous contrast.  COMPARISON:  CT scan dated 04/22/2013 and 06/09/2006  FINDINGS: No mass lesion. No midline shift. No acute hemorrhage or hematoma. No extra-axial fluid collections. No evidence of acute infarction. Brain parenchyma is normal. No osseous abnormality. Streak artifacts across the pons.  IMPRESSION: Normal exam.   Electronically Signed   By: Rozetta Nunnery M.D.   On: 11/02/2013 14:56   Mr Brain Wo Contrast  11/02/2013   CLINICAL DATA:  Diabetes and hypertension. Numbness of the right arm.  EXAM: MRI HEAD WITHOUT CONTRAST  TECHNIQUE: Multiplanar, multiecho pulse sequences of the brain and surrounding structures were obtained without intravenous contrast.  COMPARISON:  Head CT same day.  FINDINGS: Diffusion imaging does not show any acute or subacute infarction. The brainstem and cerebellum are normal. The cerebral hemispheres show a few punctate foci of T2 and FLAIR signal within the white matter that could indicate the earliest manifestation of small vessel disease. No cortical or large vessel territory insult. No mass lesion, hemorrhage, hydrocephalus  or extra-axial collection. No pituitary mass. No inflammatory sinus disease. No skull or skullbase lesion.  IMPRESSION: No acute finding. Normal study with the exception of a few punctate foci of T2 and FLAIR signal within the hemispheric white matter that could represent the earliest manifestation of small vessel change.   Electronically Signed   By: Nelson Chimes M.D.   On: 11/02/2013 21:00    Assessment: 43 y.o. female presenting with an episode of left arm and left facial numbness.  Symptoms have now resolved.  Patient with vascular risk factors that are poorly controlled.  ABCD2 score of 4, therefore patient admitted for TIA work up. Head CT and MRI of the brain were reviewed and neither show evidence of an acute infarct.  Further work up recommended.  Stroke Risk Factors - smoking, diabetes mellitus and hypertension  Plan: 1. HgbA1c, fasting lipid panel 2. Echocardiogram 3. Carotid dopplers 4. Prophylactic therapy-Antiplatelet med: Aspirin - dose 383m daily 5. Smoking cessation counseling 6. Telemetry monitoring 7. Frequent neuro checks   LAlexis Goodell MD [facility] [phone]/31/2015, 4:01 AM

## 2013-11-06 ENCOUNTER — Telehealth: Payer: Self-pay | Admitting: Internal Medicine

## 2013-11-06 ENCOUNTER — Telehealth: Payer: Self-pay | Admitting: Emergency Medicine

## 2013-11-06 ENCOUNTER — Ambulatory Visit: Payer: BC Managed Care – PPO | Attending: Internal Medicine

## 2013-11-06 VITALS — BP 150/95 | HR 91 | Temp 98.6°F | Resp 18

## 2013-11-06 DIAGNOSIS — E1110 Type 2 diabetes mellitus with ketoacidosis without coma: Secondary | ICD-10-CM

## 2013-11-06 LAB — GLUCOSE, POCT (MANUAL RESULT ENTRY)
POC Glucose: 289 mg/dl — AB (ref 70–99)
POC Glucose: 299 mg/dl — AB (ref 70–99)

## 2013-11-06 MED ORDER — INSULIN ASPART 100 UNIT/ML ~~LOC~~ SOLN
20.0000 [IU] | Freq: Once | SUBCUTANEOUS | Status: AC
  Administered 2013-11-06: 20 [IU] via SUBCUTANEOUS

## 2013-11-06 NOTE — Telephone Encounter (Signed)
Pt came in as walk in requesting sooner appt. States she has no money to pick up prescribed Metformin and BP meds Pt received medication from pharmacy. Santiago Glad will schedule appt for her

## 2013-11-06 NOTE — Telephone Encounter (Signed)
Pt come in s/p

## 2013-11-06 NOTE — Progress Notes (Unsigned)
   Subjective:    Patient ID: Caitlin Fritz, female    DOB: January 30, 1971, 43 y.o.   MRN: [id number]  HPI    Review of Systems     Objective:   Physical Exam        Assessment & Plan:  Pt came in with shakiness and feeling weak post d/c diabetes crisis. Pt didn't get medication filled due to cost. CBG-299 20 units Novolog given in clinic. BP 171/102 pt picked meds up and repeat cbg 286 Pt instructed to return in am for recheck cbg

## 2013-11-06 NOTE — Telephone Encounter (Signed)
Patient has questions about her medicines.

## 2013-11-07 ENCOUNTER — Ambulatory Visit: Payer: BC Managed Care – PPO | Attending: Internal Medicine | Admitting: Internal Medicine

## 2013-11-07 DIAGNOSIS — E119 Type 2 diabetes mellitus without complications: Secondary | ICD-10-CM

## 2013-11-07 LAB — COMPLETE METABOLIC PANEL WITH GFR
ALBUMIN: 3.8 g/dL (ref 3.5–5.2)
ALT: 32 U/L (ref 0–35)
AST: 18 U/L (ref 0–37)
Alkaline Phosphatase: 119 U/L — ABNORMAL HIGH (ref 39–117)
BUN: 11 mg/dL (ref 6–23)
CHLORIDE: 101 meq/L (ref 96–112)
CO2: 24 mEq/L (ref 19–32)
Calcium: 9.1 mg/dL (ref 8.4–10.5)
Creat: 0.68 mg/dL (ref 0.50–1.10)
GFR, Est African American: >89 mL/min
GLUCOSE: 355 mg/dL — AB (ref 70–99)
POTASSIUM: 4.7 meq/L (ref 3.5–5.3)
Sodium: 134 mEq/L — ABNORMAL LOW (ref 135–145)
TOTAL PROTEIN: 7 g/dL (ref 6.0–8.3)
Total Bilirubin: 0.3 mg/dL (ref 0.2–1.2)

## 2013-11-07 LAB — POCT URINALYSIS DIPSTICK
BILIRUBIN UA: NEGATIVE
Blood, UA: NEGATIVE
GLUCOSE UA: 1000
Ketones, UA: NEGATIVE
Leukocytes, UA: NEGATIVE
Nitrite, UA: NEGATIVE
Protein, UA: NEGATIVE
Urobilinogen, UA: 0.2
pH, UA: 6

## 2013-11-07 MED ORDER — INSULIN NPH ISOPHANE & REGULAR (70-30) 100 UNIT/ML ~~LOC~~ SUSP: 20.0000 [IU] | Freq: Every day | SUBCUTANEOUS | Status: DC

## 2013-11-08 ENCOUNTER — Ambulatory Visit: Payer: BC Managed Care – PPO | Attending: Internal Medicine

## 2013-11-08 DIAGNOSIS — E1110 Type 2 diabetes mellitus with ketoacidosis without coma: Secondary | ICD-10-CM

## 2013-11-08 DIAGNOSIS — E1310 Other specified diabetes mellitus with ketoacidosis without coma: Secondary | ICD-10-CM

## 2013-11-08 LAB — GLUCOSE, POCT (MANUAL RESULT ENTRY): POC Glucose: 159 mg/dl — AB (ref 70–99)

## 2013-11-14 ENCOUNTER — Telehealth: Payer: Self-pay | Admitting: Internal Medicine

## 2013-11-14 NOTE — Telephone Encounter (Signed)
Have left several messages on voicemail to try to move follow-up appointment date for earlier.

## 2013-11-16 NOTE — Telephone Encounter (Signed)
Message copied by Ricci Barker on Fri Nov 16, 2013  3:57 PM ------      Message from: Tresa Garter      Created: Fri Nov 16, 2013 11:17 AM       Please inform patient that we encouraged her to continue tight glucose control with her medications, her last hemoglobin A1c was 10.5% and her blood glucose is still high, encouraged her to maintain a record of her blood glucose at home and bring to next visit ------

## 2013-11-16 NOTE — Telephone Encounter (Signed)
Left message with instructions of keeping blood glucose monitoring.

## 2013-12-04 ENCOUNTER — Ambulatory Visit: Payer: BC Managed Care – PPO | Attending: Internal Medicine | Admitting: Internal Medicine

## 2013-12-04 ENCOUNTER — Encounter: Payer: Self-pay | Admitting: Internal Medicine

## 2013-12-04 VITALS — BP 150/91 | HR 98 | Temp 98.9°F | Resp 16 | Ht 65.5 in | Wt 234.0 lb

## 2013-12-04 DIAGNOSIS — F17200 Nicotine dependence, unspecified, uncomplicated: Secondary | ICD-10-CM | POA: Insufficient documentation

## 2013-12-04 DIAGNOSIS — J45909 Unspecified asthma, uncomplicated: Secondary | ICD-10-CM | POA: Insufficient documentation

## 2013-12-04 DIAGNOSIS — Z794 Long term (current) use of insulin: Secondary | ICD-10-CM | POA: Insufficient documentation

## 2013-12-04 DIAGNOSIS — Z09 Encounter for follow-up examination after completed treatment for conditions other than malignant neoplasm: Secondary | ICD-10-CM | POA: Insufficient documentation

## 2013-12-04 DIAGNOSIS — E119 Type 2 diabetes mellitus without complications: Secondary | ICD-10-CM | POA: Insufficient documentation

## 2013-12-04 DIAGNOSIS — R143 Flatulence: Secondary | ICD-10-CM

## 2013-12-04 DIAGNOSIS — R141 Gas pain: Secondary | ICD-10-CM | POA: Insufficient documentation

## 2013-12-04 DIAGNOSIS — I10 Essential (primary) hypertension: Secondary | ICD-10-CM | POA: Insufficient documentation

## 2013-12-04 DIAGNOSIS — R142 Eructation: Secondary | ICD-10-CM | POA: Insufficient documentation

## 2013-12-04 LAB — GLUCOSE, POCT (MANUAL RESULT ENTRY): POC Glucose: 163 mg/dl — AB (ref 70–99)

## 2013-12-04 NOTE — Patient Instructions (Signed)

## 2013-12-04 NOTE — Progress Notes (Signed)
Patient ID: Caitlin Fritz, female   DOB: 1971-06-05, 43 y.o.   MRN: [id number]   Caitlin Fritz, is a 43 y.o. female  [id number]  [id number]  DOB - 12-26-1970  No chief complaint on file.       Subjective:   Caitlin Fritz is a 42 y.o. female here today for a follow up visit. Patient has history of hypertension and diabetes on medications as listed below. She came in today because she has been having abdominal bloating, she thinks her abdomen is bigger than before, she has no nausea or vomiting, she has no change in bowel habit, she has bowel movement on daily basis. She is taking her medications as prescribed. She continue to smoke cigarette about half a pack per day she claims she had stopped drinking alcohol. There is no report of abdominal pain. She does want to be sure of why her abdomen is big though not distended. Patient has No headache, No chest pain, No abdominal pain - No Nausea, No new weakness tingling or numbness, No Cough - SOB.  No problems updated.  ALLERGIES: Allergies  Allergen Reactions  . Clonidine Derivatives Other (See Comments)    Seizures   . Prednisone Other (See Comments)    Eye drops only-temporary blindness     PAST MEDICAL HISTORY: Past Medical History  Diagnosis Date  . Hypertension   . Diabetes mellitus without complication   . Asthma     MEDICATIONS AT HOME: Prior to Admission medications   Medication Sig Start Date End Date Taking? Authorizing Provider  albuterol (PROVENTIL HFA;VENTOLIN HFA) 108 (90 BASE) MCG/ACT inhaler Inhale 2 puffs into the lungs every 4 (four) hours as needed for wheezing. 02/13/13  Yes Jennifer L Piepenbrink, PA-C  ALPRAZolam (XANAX) 0.5 MG tablet Take 1 tablet (0.5 mg total) by mouth at bedtime. 09/24/13  Yes Angelica Chessman, MD  amLODipine (NORVASC) 10 MG tablet Take 1 tablet (10 mg total) by mouth daily. 10/30/13  Yes Angelica Chessman, MD  aspirin EC 81 MG EC tablet Take 1 tablet (81 mg total) by  mouth daily. 11/03/13  Yes Estela Leonie Green, MD  famotidine (PEPCID) 20 MG tablet Take 1 tablet (20 mg total) by mouth 2 (two) times daily. 07/24/13  Yes Angelica Chessman, MD  gabapentin (NEURONTIN) 300 MG capsule Take 1 capsule (300 mg total) by mouth 3 (three) times daily. 09/24/13  Yes Angelica Chessman, MD  glipiZIDE (GLUCOTROL) 10 MG tablet Take 1 tablet (10 mg total) by mouth 2 (two) times daily before a meal. 10/30/13  Yes Angelica Chessman, MD  glucose monitoring kit (FREESTYLE) monitoring kit 1 each by Does not apply route 4 (four) times daily - after meals and at bedtime. 1 month Diabetic Testing Supplies for QAC-QHS accuchecks. 06/11/13  Yes Thurnell Lose, MD  hydrALAZINE (APRESOLINE) 25 MG tablet Take 1 tablet (25 mg total) by mouth 3 (three) times daily. 09/24/13  Yes Angelica Chessman, MD  insulin NPH-regular Human (HUMULIN 70/30) (70-30) 100 UNIT/ML injection Inject 20 Units into the skin daily with supper. 11/07/13  Yes Reyne Dumas, MD  metFORMIN (GLUCOPHAGE) 1000 MG tablet Take 1 tablet (1,000 mg total) by mouth 2 (two) times daily. 10/30/13  Yes Angelica Chessman, MD     Objective:   Filed Vitals:   12/04/13 0913  BP: 150/91  Pulse: 98  Temp: 98.9 F (37.2 C)  TempSrc: Oral  Resp: 16  Height: 5' 5.5" (1.664 m)  Weight: 234 lb (106.142 kg)  SpO2: 98%  Exam General appearance : Awake, alert, not in any distress. Speech Clear. Not toxic looking HEENT: Atraumatic and Normocephalic, pupils equally reactive to light and accomodation Neck: supple, no JVD. No cervical lymphadenopathy.  Chest:Good air entry bilaterally, no added sounds  CVS: S1 S2 regular, no murmurs.  Abdomen: Bowel sounds present, Non tender and not distended with no gaurding, rigidity or rebound. No evidence of ascites. Extremities: B/L Lower Ext shows no edema, both legs are warm to touch Neurology: Awake alert, and oriented X 3, CN II-XII intact, Non focal Skin:No Rash Wounds:N/A  Data  Review  Assessment & Plan   1. Diabetes  - Glucose (CBG)  Patient was extensively counseled on nutrition and exercise Patient was counseled on smoking cessation  Patient is to continue her current regimen for hypertension and diabetes  Return in about 4 weeks (around 01/01/2014), or if symptoms worsen or fail to improve, for Routine Follow Up, CBG.  The patient was given clear instructions to go to ER or return to medical center if symptoms don't improve, worsen or new problems develop. The patient verbalized understanding. The patient was told to call to get lab results if they haven't heard anything in the next week.    Angelica Chessman, MD, [place], [place], [place] and [place], [place]   12/04/2013, 5:00 PM

## 2013-12-04 NOTE — Progress Notes (Signed)
Pt is here following up on her HTN and diabetes. Pt reports after taking insulin her abdomen is swollen.

## 2014-01-07 ENCOUNTER — Encounter: Payer: Self-pay | Admitting: Internal Medicine

## 2014-01-07 ENCOUNTER — Ambulatory Visit: Payer: BC Managed Care – PPO | Attending: Internal Medicine | Admitting: Internal Medicine

## 2014-01-07 VITALS — BP 159/99 | HR 109 | Temp 98.0°F | Resp 16 | Ht 66.0 in | Wt 237.0 lb

## 2014-01-07 DIAGNOSIS — I10 Essential (primary) hypertension: Secondary | ICD-10-CM | POA: Insufficient documentation

## 2014-01-07 DIAGNOSIS — E1110 Type 2 diabetes mellitus with ketoacidosis without coma: Secondary | ICD-10-CM

## 2014-01-07 DIAGNOSIS — E119 Type 2 diabetes mellitus without complications: Secondary | ICD-10-CM

## 2014-01-07 DIAGNOSIS — E1310 Other specified diabetes mellitus with ketoacidosis without coma: Secondary | ICD-10-CM | POA: Insufficient documentation

## 2014-01-07 LAB — GLUCOSE, POCT (MANUAL RESULT ENTRY): POC Glucose: 465 mg/dl — AB (ref 70–99)

## 2014-01-07 LAB — POCT GLYCOSYLATED HEMOGLOBIN (HGB A1C): HEMOGLOBIN A1C: 8.3

## 2014-01-07 MED ORDER — HYDRALAZINE HCL 25 MG PO TABS: 25.0000 mg | ORAL_TABLET | Freq: Three times a day (TID) | ORAL | Status: DC

## 2014-01-07 MED ORDER — GABAPENTIN 300 MG PO CAPS: 300.0000 mg | ORAL_CAPSULE | Freq: Three times a day (TID) | ORAL | Status: DC

## 2014-01-07 NOTE — Progress Notes (Signed)
Patient ID: Caitlin Fritz, female   DOB: [birth year], 43 y.o.   MRN: [id number]   Yolinda Duerr, is a 43 y.o. female  [id number]  [id number]  DOB - May 26, 1971  Chief Complaint  Patient presents with  . Follow-up        Subjective:   Caitlin Fritz is a 43 y.o. female here today for a follow up visit. Patient has history of hypertension, diabetes mellitus and asthma on medications as listed below. She is here today for routine followup and to refill her medications. She has no complaint today. She continue to smoke cigarette about half a pack per day but she's trying to quit. She will let me know her quit date. Patient has No headache, No chest pain, No abdominal pain - No Nausea, No new weakness tingling or numbness, No Cough - SOB.  No problems updated.  ALLERGIES: Allergies  Allergen Reactions  . Clonidine Derivatives Other (See Comments)    Seizures   . Prednisone Other (See Comments)    Eye drops only-temporary blindness     PAST MEDICAL HISTORY: Past Medical History  Diagnosis Date  . Hypertension   . Diabetes mellitus without complication   . Asthma     MEDICATIONS AT HOME: Prior to Admission medications   Medication Sig Start Date End Date Taking? Authorizing Provider  albuterol (PROVENTIL HFA;VENTOLIN HFA) 108 (90 BASE) MCG/ACT inhaler Inhale 2 puffs into the lungs every 4 (four) hours as needed for wheezing. 02/13/13  Yes Jennifer L Piepenbrink, PA-C  ALPRAZolam (XANAX) 0.5 MG tablet Take 1 tablet (0.5 mg total) by mouth at bedtime. 09/24/13  Yes Angelica Chessman, MD  amLODipine (NORVASC) 10 MG tablet Take 1 tablet (10 mg total) by mouth daily. 10/30/13  Yes Angelica Chessman, MD  aspirin EC 81 MG EC tablet Take 1 tablet (81 mg total) by mouth daily. 11/03/13  Yes Estela Leonie Green, MD  famotidine (PEPCID) 20 MG tablet Take 1 tablet (20 mg total) by mouth 2 (two) times daily. 07/24/13  Yes Angelica Chessman, MD  gabapentin (NEURONTIN) 300  MG capsule Take 1 capsule (300 mg total) by mouth 3 (three) times daily. 01/07/14  Yes Angelica Chessman, MD  glipiZIDE (GLUCOTROL) 10 MG tablet Take 1 tablet (10 mg total) by mouth 2 (two) times daily before a meal. 10/30/13  Yes Angelica Chessman, MD  glucose monitoring kit (FREESTYLE) monitoring kit 1 each by Does not apply route 4 (four) times daily - after meals and at bedtime. 1 month Diabetic Testing Supplies for QAC-QHS accuchecks. 06/11/13  Yes Thurnell Lose, MD  hydrALAZINE (APRESOLINE) 25 MG tablet Take 1 tablet (25 mg total) by mouth 3 (three) times daily. 01/07/14  Yes Angelica Chessman, MD  insulin NPH-regular Human (HUMULIN 70/30) (70-30) 100 UNIT/ML injection Inject 20 Units into the skin daily with supper. 11/07/13  Yes Reyne Dumas, MD  metFORMIN (GLUCOPHAGE) 1000 MG tablet Take 1 tablet (1,000 mg total) by mouth 2 (two) times daily. 10/30/13  Yes Angelica Chessman, MD     Objective:   Filed Vitals:   01/07/14 1711  BP: 159/99  Pulse: 109  Temp: 98 F (36.7 C)  TempSrc: Oral  Resp: 16  Height: _0  (1.676 m)  Weight: 237 lb (107.502 kg)  SpO2: 94%    Exam General appearance : Awake, alert, not in any distress. Speech Clear. Not toxic looking HEENT: Atraumatic and Normocephalic, pupils equally reactive to light and accomodation Neck: supple, no JVD. No cervical lymphadenopathy.  Chest:Good air entry  bilaterally, no added sounds  CVS: S1 S2 regular, no murmurs.  Abdomen: Bowel sounds present, Non tender and not distended with no gaurding, rigidity or rebound. Extremities: B/L Lower Ext shows no edema, both legs are warm to touch Neurology: Awake alert, and oriented X 3, CN II-XII intact, Non focal Skin:No Rash Wounds:N/A  Data Review Lab Results  Component Value Date   HGBA1C 8.3 01/07/2014   HGBA1C 10.5* 11/03/2013     Assessment & Plan   1. Diabetes  - Glucose (CBG) - HgB A1c is 8.3% down from 10.5% in January  2. HTN (hypertension)  - hydrALAZINE  (APRESOLINE) 25 MG tablet; Take 1 tablet (25 mg total) by mouth 3 (three) times daily.  Dispense: 90 tablet; Refill: 3  3. DM (diabetes mellitus) type 2, uncontrolled, with ketoacidosis  - gabapentin (NEURONTIN) 300 MG capsule; Take 1 capsule (300 mg total) by mouth 3 (three) times daily.  Dispense: 270 capsule; Refill: 3  Patient was counseled extensively about nutrition and exercise Patient was counseled on smoking cessation   Return in about 4 weeks (around 02/04/2014), or if symptoms worsen or fail to improve, for Hemoglobin A1C and Follow up, DM.  The patient was given clear instructions to go to ER or return to medical center if symptoms don't improve, worsen or new problems develop. The patient verbalized understanding. The patient was told to call to get lab results if they haven't heard anything in the next week.   This note has been created with Surveyor, quantity. Any transcriptional errors are unintentional.    Angelica Chessman, MD, [place], [place], [place] and [place], [place]   01/07/2014, 5:47 PM

## 2014-01-07 NOTE — Progress Notes (Signed)
Pt is here following up on her diabetes. 

## 2014-01-07 NOTE — Patient Instructions (Signed)
DASH Diet The DASH diet stands for "Dietary Approaches to Stop Hypertension." It is a healthy eating plan that has been shown to reduce high blood pressure (hypertension) in as little as 14 days, while also possibly providing other significant health benefits. These other health benefits include reducing the risk of breast cancer after menopause and reducing the risk of type 2 diabetes, heart disease, colon cancer, and stroke. Health benefits also include weight loss and slowing kidney failure in patients with chronic kidney disease.  DIET GUIDELINES  Limit salt (sodium). Your diet should contain less than 1500 mg of sodium daily.  Limit refined or processed carbohydrates. Your diet should include mostly whole grains. Desserts and added sugars should be used sparingly.  Include small amounts of heart-healthy fats. These types of fats include nuts, oils, and tub margarine. Limit saturated and trans fats. These fats have been shown to be harmful in the body. CHOOSING FOODS  The following food groups are based on a 2000 calorie diet. See your Registered Dietitian for individual calorie needs. Grains and Grain Products (6 to 8 servings daily)  Eat More Often: Whole-wheat bread, brown rice, whole-grain or wheat pasta, quinoa, popcorn without added fat or salt (air popped).  Eat Less Often: White bread, white pasta, white rice, cornbread. Vegetables (4 to 5 servings daily)  Eat More Often: Fresh, frozen, and canned vegetables. Vegetables may be raw, steamed, roasted, or grilled with a minimal amount of fat.  Eat Less Often/Avoid: Creamed or fried vegetables. Vegetables in a cheese sauce. Fruit (4 to 5 servings daily)  Eat More Often: All fresh, canned (in natural juice), or frozen fruits. Dried fruits without added sugar. One hundred percent fruit juice ( cup [237 mL] daily).  Eat Less Often: Dried fruits with added sugar. Canned fruit in light or heavy syrup. Lean Meats, Fish, and Poultry (2  servings or less daily. One serving is 3 to 4 oz [85-114 g]).  Eat More Often: Ninety percent or leaner ground beef, tenderloin, sirloin. Round cuts of beef, chicken breast, turkey breast. All fish. Grill, bake, or broil your meat. Nothing should be fried.  Eat Less Often/Avoid: Fatty cuts of meat, turkey, or chicken leg, thigh, or wing. Fried cuts of meat or fish. Dairy (2 to 3 servings)  Eat More Often: Low-fat or fat-free milk, low-fat plain or light yogurt, reduced-fat or part-skim cheese.  Eat Less Often/Avoid: Milk (whole, 2%).Whole milk yogurt. Full-fat cheeses. Nuts, Seeds, and Legumes (4 to 5 servings per week)  Eat More Often: All without added salt.  Eat Less Often/Avoid: Salted nuts and seeds, canned beans with added salt. Fats and Sweets (limited)  Eat More Often: Vegetable oils, tub margarines without trans fats, sugar-free gelatin. Mayonnaise and salad dressings.  Eat Less Often/Avoid: Coconut oils, palm oils, butter, stick margarine, cream, half and half, cookies, candy, pie. FOR MORE INFORMATION The Dash Diet Eating Plan: www.dashdiet.org Document Released: 09/09/2011 Document Revised: 12/13/2011 Document Reviewed: 09/09/2011 ExitCare Patient Information 2014 [employer]. Diabetes Meal Planning Guide The diabetes meal planning guide is a tool to help you plan your meals and snacks. It is important for people with diabetes to manage their blood glucose (sugar) levels. Choosing the right foods and the right amounts throughout your day will help control your blood glucose. Eating right can even help you improve your blood pressure and reach or maintain a healthy weight. CARBOHYDRATE COUNTING MADE EASY When you eat carbohydrates, they turn to sugar. This raises your blood glucose level. Counting carbohydrates   can help you control this level so you feel better. When you plan your meals by counting carbohydrates, you can have more flexibility in what you eat and balance  your medicine with your food intake. Carbohydrate counting simply means adding up the total amount of carbohydrate grams in your meals and snacks. Try to eat about the same amount at each meal. Foods with carbohydrates are listed below. Each portion below is 1 carbohydrate serving or 15 grams of carbohydrates. Ask your dietician how many grams of carbohydrates you should eat at each meal or snack. Grains and Starches  1 slice bread.   English muffin or hotdog/hamburger bun.   cup cold cereal (unsweetened).   cup cooked pasta or rice.   cup starchy vegetables (corn, potatoes, peas, beans, winter squash).  1 tortilla (6 inches).   bagel.  1 waffle or pancake (size of a CD).   cup cooked cereal.  4 to 6 small crackers. *Whole grain is recommended. Fruit  1 cup fresh unsweetened berries, melon, papaya, pineapple.  1 small fresh fruit.   banana or mango.   cup fruit juice (4 oz unsweetened).   cup canned fruit in natural juice or water.  2 tbs dried fruit.  12 to 15 grapes or cherries. Milk and Yogurt  1 cup fat-free or 1% milk.  1 cup soy milk.  6 oz light yogurt with sugar-free sweetener.  6 oz low-fat soy yogurt.  6 oz plain yogurt. Vegetables  1 cup raw or  cup cooked is counted as 0 carbohydrates or a "free" food.  If you eat 3 or more servings at 1 meal, count them as 1 carbohydrate serving. Other Carbohydrates   oz chips or pretzels.   cup ice cream or frozen yogurt.   cup sherbet or sorbet.  2 inch square cake, no frosting.  1 tbs honey, sugar, jam, jelly, or syrup.  2 small cookies.  3 squares of graham crackers.  3 cups popcorn.  6 crackers.  1 cup broth-based soup.  Count 1 cup casserole or other mixed foods as 2 carbohydrate servings.  Foods with less than 20 calories in a serving may be counted as 0 carbohydrates or a "free" food. You may want to purchase a book or computer software that lists the carbohydrate gram  counts of different foods. In addition, the nutrition facts panel on the labels of the foods you eat are a good source of this information. The label will tell you how big the serving size is and the total number of carbohydrate grams you will be eating per serving. Divide this number by 15 to obtain the number of carbohydrate servings in a portion. Remember, 1 carbohydrate serving equals 15 grams of carbohydrate. SERVING SIZES Measuring foods and serving sizes helps you make sure you are getting the right amount of food. The list below tells how big or small some common serving sizes are.  1 oz.........4 stacked dice.  3 oz.........Deck of cards.  1 tsp........Tip of little finger.  1 tbs........Thumb.  2 tbs........Golf ball.   cup.......Half of a fist.  1 cup........A fist. SAMPLE DIABETES MEAL PLAN Below is a sample meal plan that includes foods from the grain and starches, dairy, vegetable, fruit, and meat groups. A dietician can individualize a meal plan to fit your calorie needs and tell you the number of servings needed from each food group. However, controlling the total amount of carbohydrates in your meal or snack is more important than making sure you   include all of the food groups at every meal. You may interchange carbohydrate containing foods (dairy, starches, and fruits). The meal plan below is an example of a 2000 calorie diet using carbohydrate counting. This meal plan has 17 carbohydrate servings. Breakfast  1 cup oatmeal (2 carb servings).   cup light yogurt (1 carb serving).  1 cup blueberries (1 carb serving).   cup almonds. Snack  1 large apple (2 carb servings).  1 low-fat string cheese stick. Lunch  Chicken breast salad.  1 cup spinach.   cup chopped tomatoes.  2 oz chicken breast, sliced.  2 tbs low-fat [place] dressing.  12 whole-wheat crackers (2 carb servings).  12 to 15 grapes (1 carb serving).  1 cup low-fat milk (1 carb  serving). Snack  1 cup carrots.   cup hummus (1 carb serving). Dinner  3 oz broiled salmon.  1 cup brown rice (3 carb servings). Snack  1  cups steamed broccoli (1 carb serving) drizzled with 1 tsp olive oil and lemon juice.  1 cup light pudding (2 carb servings). DIABETES MEAL PLANNING WORKSHEET Your dietician can use this worksheet to help you decide how many servings of foods and what types of foods are right for you.  BREAKFAST Food Group and Servings / Carb Servings Grain/Starches __________________________________ Dairy __________________________________________ Vegetable ______________________________________ Fruit ___________________________________________ Meat __________________________________________ Fat ____________________________________________ LUNCH Food Group and Servings / Carb Servings Grain/Starches ___________________________________ Dairy ___________________________________________ Fruit ____________________________________________ Meat ___________________________________________ Fat _____________________________________________ [employer] Food Group and Servings / Carb Servings Grain/Starches ___________________________________ Dairy ___________________________________________ Fruit ____________________________________________ Meat ___________________________________________ Fat _____________________________________________ SNACKS Food Group and Servings / Carb Servings Grain/Starches ___________________________________ Dairy ___________________________________________ Vegetable _______________________________________ Fruit ____________________________________________ Meat ___________________________________________ Fat _____________________________________________ DAILY TOTALS Starches _________________________ Vegetable ________________________ Fruit ____________________________ Dairy ____________________________ Meat  ____________________________ Fat ______________________________ Document Released: 06/17/2005 Document Revised: 12/13/2011 Document Reviewed: 04/28/2009 ExitCare Patient Information 2014 [employer]. Diabetes and Exercise Exercising regularly is important. It is not just about losing weight. It has many health benefits, such as:  Improving your overall fitness, flexibility, and endurance.  Increasing your bone density.  Helping with weight control.  Decreasing your body fat.  Increasing your muscle strength.  Reducing stress and tension.  Improving your overall health. People with diabetes who exercise gain additional benefits because exercise:  Reduces appetite.  Improves the body's use of blood sugar (glucose).  Helps lower or control blood glucose.  Decreases blood pressure.  Helps control blood lipids (such as cholesterol and triglycerides).  Improves the body's use of the hormone insulin by:  Increasing the body's insulin sensitivity.  Reducing the body's insulin needs.  Decreases the risk for heart disease because exercising:  Lowers cholesterol and triglycerides levels.  Increases the levels of good cholesterol (such as high-density lipoproteins [HDL]) in the body.  Lowers blood glucose levels. YOUR ACTIVITY PLAN  Choose an activity that you enjoy and set realistic goals. Your health care provider or diabetes educator can help you make an activity plan that works for you. You can break activities into 2 or 3 sessions throughout the day. Doing so is as good as one long session. Exercise ideas include:  Taking the dog for a walk.  Taking the stairs instead of the elevator.  Dancing to your favorite song.  Doing your favorite exercise with a friend. RECOMMENDATIONS FOR EXERCISING WITH TYPE 1 OR TYPE 2 DIABETES   Check your blood glucose before exercising. If blood glucose levels are greater than 240 mg/dL, check for urine ketones. Do not exercise if  ketones are present.  Avoid injecting insulin into areas of the body that are going to be exercised. For example, avoid injecting insulin into:  The arms when playing tennis.  The legs when jogging.  Keep a record of:  Food intake before and after you exercise.  Expected peak times of insulin action.  Blood glucose levels before and after you exercise.  The type and amount of exercise you have done.  Review your records with your health care provider. Your health care provider will help you to develop guidelines for adjusting food intake and insulin amounts before and after exercising.  If you take insulin or oral hypoglycemic agents, watch for signs and symptoms of hypoglycemia. They include:  Dizziness.  Shaking.  Sweating.  Chills.  Confusion.  Drink plenty of water while you exercise to prevent dehydration or heat stroke. Body water is lost during exercise and must be replaced.  Talk to your health care provider before starting an exercise program to make sure it is safe for you. Remember, almost any type of activity is better than none. Document Released: 12/11/2003 Document Revised: 05/23/2013 Document Reviewed: 02/27/2013 [facility] Patient Information 2014 [place].

## 2014-01-08 ENCOUNTER — Ambulatory Visit: Payer: BC Managed Care – PPO | Attending: Internal Medicine

## 2014-01-08 DIAGNOSIS — E1110 Type 2 diabetes mellitus with ketoacidosis without coma: Secondary | ICD-10-CM

## 2014-01-08 DIAGNOSIS — E1310 Other specified diabetes mellitus with ketoacidosis without coma: Secondary | ICD-10-CM

## 2014-01-08 LAB — GLUCOSE, POCT (MANUAL RESULT ENTRY): POC GLUCOSE: 169 mg/dL — AB (ref 70–99)

## 2014-01-08 MED ORDER — INSULIN ASPART 100 UNIT/ML ~~LOC~~ SOLN: 40.0000 [IU] | Freq: Once | SUBCUTANEOUS | Status: DC

## 2014-01-08 NOTE — Progress Notes (Unsigned)
Pt was seen yesterday and her CBG was above 400. The doctor asked her to come in today to check her CBG to see if it was in normal range. Today her CBG was 169

## 2014-01-28 ENCOUNTER — Telehealth: Payer: Self-pay | Admitting: Internal Medicine

## 2014-01-28 NOTE — Telephone Encounter (Signed)
Please call patient, patient states that she needs an antibiotic prescription

## 2014-01-31 ENCOUNTER — Telehealth: Payer: Self-pay | Admitting: Emergency Medicine

## 2014-01-31 NOTE — Telephone Encounter (Signed)
Pt called in c/o boil under right breast with shooting radiating pain to right shoulder and abdominal side. States she noticed boil getting hard with dark color. No drainage reported or fevers. Informed pt to apply heat overnight to affected area,if no improvement to come in for Nurse triage visit- Candie Chroman RN

## 2014-02-04 ENCOUNTER — Other Ambulatory Visit: Payer: Self-pay | Admitting: Internal Medicine

## 2014-02-04 ENCOUNTER — Other Ambulatory Visit: Payer: BC Managed Care – PPO | Admitting: *Deleted

## 2014-02-04 MED ORDER — DOXYCYCLINE HYCLATE 100 MG PO TABS: 100.0000 mg | ORAL_TABLET | Freq: Two times a day (BID) | ORAL | Status: DC

## 2014-02-04 NOTE — Patient Instructions (Signed)
Take Doxycycline as prescribed. Continue to warm compresses to help with draining. Follow up with the clinic after 2 weeks if no improvement. Keep your follow up appointment for 02/06/2014 for Lab work.

## 2014-02-04 NOTE — Progress Notes (Unsigned)
Patient here today for a boil under the right breast. Patient states the boil ruptured yesterday but is having some pain. Consulted with Dr. Doreene Burke who has prescribed Doxycycline 100 mg twice for 14 days. Alverda Skeans, RN

## 2014-02-06 ENCOUNTER — Other Ambulatory Visit: Payer: BC Managed Care – PPO

## 2014-03-11 ENCOUNTER — Ambulatory Visit: Payer: BC Managed Care – PPO | Attending: Internal Medicine | Admitting: Internal Medicine

## 2014-03-11 ENCOUNTER — Encounter: Payer: Self-pay | Admitting: Internal Medicine

## 2014-03-11 VITALS — BP 132/85 | HR 101 | Temp 98.0°F | Resp 18 | Ht 66.0 in | Wt 241.6 lb

## 2014-03-11 DIAGNOSIS — J45909 Unspecified asthma, uncomplicated: Secondary | ICD-10-CM | POA: Insufficient documentation

## 2014-03-11 DIAGNOSIS — E1310 Other specified diabetes mellitus with ketoacidosis without coma: Secondary | ICD-10-CM | POA: Insufficient documentation

## 2014-03-11 DIAGNOSIS — L0293 Carbuncle, unspecified: Secondary | ICD-10-CM

## 2014-03-11 DIAGNOSIS — E1110 Type 2 diabetes mellitus with ketoacidosis without coma: Secondary | ICD-10-CM

## 2014-03-11 DIAGNOSIS — I10 Essential (primary) hypertension: Secondary | ICD-10-CM | POA: Insufficient documentation

## 2014-03-11 DIAGNOSIS — Z794 Long term (current) use of insulin: Secondary | ICD-10-CM | POA: Insufficient documentation

## 2014-03-11 DIAGNOSIS — F4323 Adjustment disorder with mixed anxiety and depressed mood: Secondary | ICD-10-CM | POA: Insufficient documentation

## 2014-03-11 DIAGNOSIS — E119 Type 2 diabetes mellitus without complications: Secondary | ICD-10-CM

## 2014-03-11 DIAGNOSIS — Z79899 Other long term (current) drug therapy: Secondary | ICD-10-CM | POA: Insufficient documentation

## 2014-03-11 DIAGNOSIS — L0292 Furuncle, unspecified: Secondary | ICD-10-CM | POA: Insufficient documentation

## 2014-03-11 LAB — POCT GLYCOSYLATED HEMOGLOBIN (HGB A1C): HEMOGLOBIN A1C: 8

## 2014-03-11 LAB — GLUCOSE, POCT (MANUAL RESULT ENTRY): POC Glucose: 184 mg/dl — AB (ref 70–99)

## 2014-03-11 MED ORDER — DOXYCYCLINE HYCLATE 100 MG PO TABS: 100.0000 mg | ORAL_TABLET | Freq: Two times a day (BID) | ORAL | Status: DC

## 2014-03-11 MED ORDER — ALPRAZOLAM 0.5 MG PO TABS: 0.5000 mg | ORAL_TABLET | Freq: Every day | ORAL | Status: DC

## 2014-03-11 MED ORDER — AMLODIPINE BESYLATE 10 MG PO TABS: 10.0000 mg | ORAL_TABLET | Freq: Every day | ORAL | Status: DC

## 2014-03-11 MED ORDER — GLIPIZIDE 10 MG PO TABS: 10.0000 mg | ORAL_TABLET | Freq: Two times a day (BID) | ORAL | Status: DC

## 2014-03-11 MED ORDER — METFORMIN HCL 1000 MG PO TABS: 1000.0000 mg | ORAL_TABLET | Freq: Two times a day (BID) | ORAL | Status: DC

## 2014-03-11 MED ORDER — INSULIN NPH ISOPHANE & REGULAR (70-30) 100 UNIT/ML ~~LOC~~ SUSP: 20.0000 [IU] | Freq: Two times a day (BID) | SUBCUTANEOUS | Status: DC

## 2014-03-11 MED ORDER — GABAPENTIN 300 MG PO CAPS: 300.0000 mg | ORAL_CAPSULE | Freq: Three times a day (TID) | ORAL | Status: DC

## 2014-03-11 MED ORDER — GENTAMICIN SULFATE 0.3 % OP SOLN: 2.0000 [drp] | Freq: Three times a day (TID) | OPHTHALMIC | Status: DC

## 2014-03-11 MED ORDER — HYDRALAZINE HCL 25 MG PO TABS: 25.0000 mg | ORAL_TABLET | Freq: Three times a day (TID) | ORAL | Status: DC

## 2014-03-11 NOTE — Progress Notes (Unsigned)
Patient ID: Caitlin Fritz, female   DOB: 04-21-1971, 43 y.o.   MRN: [id number]   Jodie Cavey, is a 43 y.o. female  [id number]  [id number]  DOB - Nov 22, 1970  Chief Complaint  Patient presents with  . Follow-up        Subjective:   Chabeli Barsamian is a 43 y.o. female here today for a follow up visit. Patient has No headache, No chest pain, No abdominal pain - No Nausea, No new weakness tingling or numbness, No Cough - SOB.  No problems updated.  ALLERGIES: Allergies  Allergen Reactions  . Clonidine Derivatives Other (See Comments)    Seizures   . Prednisone Other (See Comments)    Eye drops only-temporary blindness     PAST MEDICAL HISTORY: Past Medical History  Diagnosis Date  . Hypertension   . Diabetes mellitus without complication   . Asthma     MEDICATIONS AT HOME: Prior to Admission medications   Medication Sig Start Date End Date Taking? Authorizing Provider  albuterol (PROVENTIL HFA;VENTOLIN HFA) 108 (90 BASE) MCG/ACT inhaler Inhale 2 puffs into the lungs every 4 (four) hours as needed for wheezing. 02/13/13   Jennifer L Piepenbrink, PA-C  ALPRAZolam (XANAX) 0.5 MG tablet Take 1 tablet (0.5 mg total) by mouth at bedtime. 03/11/14   Angelica Chessman, MD  amLODipine (NORVASC) 10 MG tablet Take 1 tablet (10 mg total) by mouth daily. 03/11/14   Angelica Chessman, MD  aspirin EC 81 MG EC tablet Take 1 tablet (81 mg total) by mouth daily. 11/03/13   Erline Hau, MD  doxycycline (VIBRA-TABS) 100 MG tablet Take 1 tablet (100 mg total) by mouth 2 (two) times daily. 03/11/14   Angelica Chessman, MD  famotidine (PEPCID) 20 MG tablet Take 1 tablet (20 mg total) by mouth 2 (two) times daily. 07/24/13   Angelica Chessman, MD  gabapentin (NEURONTIN) 300 MG capsule Take 1 capsule (300 mg total) by mouth 3 (three) times daily. 03/11/14   Angelica Chessman, MD  gentamicin (GARAMYCIN) 0.3 % ophthalmic solution Place 2 drops into the right eye 3 (three)  times daily. 03/11/14   Angelica Chessman, MD  glipiZIDE (GLUCOTROL) 10 MG tablet Take 1 tablet (10 mg total) by mouth 2 (two) times daily before a meal. 03/11/14   Angelica Chessman, MD  glucose monitoring kit (FREESTYLE) monitoring kit 1 each by Does not apply route 4 (four) times daily - after meals and at bedtime. 1 month Diabetic Testing Supplies for QAC-QHS accuchecks. 06/11/13   Thurnell Lose, MD  hydrALAZINE (APRESOLINE) 25 MG tablet Take 1 tablet (25 mg total) by mouth 3 (three) times daily. 03/11/14   Angelica Chessman, MD  insulin NPH-regular Human (HUMULIN 70/30) (70-30) 100 UNIT/ML injection Inject 20 Units into the skin 2 (two) times daily with a meal. 03/11/14   Angelica Chessman, MD  metFORMIN (GLUCOPHAGE) 1000 MG tablet Take 1 tablet (1,000 mg total) by mouth 2 (two) times daily. 03/11/14   Angelica Chessman, MD     Objective:   Filed Vitals:   03/11/14 1113  BP: 132/85  Pulse: 101  Temp: 98 F (36.7 C)  TempSrc: Oral  Resp: 18  Height: 5' 6"  (1.676 m)  Weight: 241 lb 9.6 oz (109.589 kg)  SpO2: 98%    Exam General appearance : Awake, alert, not in any distress. Speech Clear. Not toxic looking HEENT: Atraumatic and Normocephalic, pupils equally reactive to light and accomodation Neck: supple, no JVD. No cervical lymphadenopathy.  Chest:Good air entry bilaterally,  no added sounds  CVS: S1 S2 regular, no murmurs.  Abdomen: Bowel sounds present, Non tender and not distended with no gaurding, rigidity or rebound. Extremities: B/L Lower Ext shows no edema, both legs are warm to touch Neurology: Awake alert, and oriented X 3, CN II-XII intact, Non focal Skin:No Rash Wounds:N/A  Data Review Lab Results  Component Value Date   HGBA1C 8.0 03/11/2014   HGBA1C 8.3 01/07/2014   HGBA1C 10.5* 11/03/2013     Assessment & Plan   1. Diabetes *** - Glucose (CBG) - POCT A1C - insulin NPH-regular Human (HUMULIN 70/30) (70-30) 100 UNIT/ML injection; Inject 20 Units into the skin 2  (two) times daily with a meal.  Dispense: 3 mL; Refill: 3  2. Adjustment disorder with mixed anxiety and depressed mood *** - ALPRAZolam (XANAX) 0.5 MG tablet; Take 1 tablet (0.5 mg total) by mouth at bedtime.  Dispense: 30 tablet; Refill: 0  3. HTN (hypertension) *** - amLODipine (NORVASC) 10 MG tablet; Take 1 tablet (10 mg total) by mouth daily.  Dispense: 90 tablet; Refill: 3 - hydrALAZINE (APRESOLINE) 25 MG tablet; Take 1 tablet (25 mg total) by mouth 3 (three) times daily.  Dispense: 180 tablet; Refill: 3  4. Boil *** - doxycycline (VIBRA-TABS) 100 MG tablet; Take 1 tablet (100 mg total) by mouth 2 (two) times daily.  Dispense: 28 tablet; Refill: 3  5. DM (diabetes mellitus) type 2, uncontrolled, with ketoacidosis *** - gabapentin (NEURONTIN) 300 MG capsule; Take 1 capsule (300 mg total) by mouth 3 (three) times daily.  Dispense: 270 capsule; Refill: 3 - glipiZIDE (GLUCOTROL) 10 MG tablet; Take 1 tablet (10 mg total) by mouth 2 (two) times daily before a meal.  Dispense: 180 tablet; Refill: 3 - metFORMIN (GLUCOPHAGE) 1000 MG tablet; Take 1 tablet (1,000 mg total) by mouth 2 (two) times daily.  Dispense: 180 tablet; Refill: 3   AssessmentPlan    Return in about 4 weeks (around 04/08/2014), or if symptoms worsen or fail to improve, for Follow up HTN, Hemoglobin A1C and Follow up, DM.  The patient was given clear instructions to go to ER or return to medical center if symptoms don't improve, worsen or new problems develop. The patient verbalized understanding. The patient was told to call to get lab results if they haven't heard anything in the next week.   This note has been created with Surveyor, quantity. Any transcriptional errors are unintentional.    Angelica Chessman, MD, [place], [place], [place] and [facility] [place], [place]   03/11/2014, 12:47 PM

## 2014-03-11 NOTE — Progress Notes (Unsigned)
Patient here to follow up on DM. Patient also stating she has a cold and is unable to cough up anything.

## 2014-03-11 NOTE — Patient Instructions (Signed)

## 2014-04-15 ENCOUNTER — Ambulatory Visit: Payer: BC Managed Care – PPO | Attending: Internal Medicine | Admitting: Internal Medicine

## 2014-04-15 ENCOUNTER — Encounter: Payer: Self-pay | Admitting: Internal Medicine

## 2014-04-15 VITALS — BP 146/90 | HR 90 | Temp 98.0°F | Resp 16 | Ht 66.0 in | Wt 238.0 lb

## 2014-04-15 DIAGNOSIS — J45909 Unspecified asthma, uncomplicated: Secondary | ICD-10-CM | POA: Insufficient documentation

## 2014-04-15 DIAGNOSIS — Z1239 Encounter for other screening for malignant neoplasm of breast: Secondary | ICD-10-CM

## 2014-04-15 DIAGNOSIS — I10 Essential (primary) hypertension: Secondary | ICD-10-CM | POA: Diagnosis present

## 2014-04-15 DIAGNOSIS — Z794 Long term (current) use of insulin: Secondary | ICD-10-CM | POA: Diagnosis not present

## 2014-04-15 DIAGNOSIS — E1142 Type 2 diabetes mellitus with diabetic polyneuropathy: Secondary | ICD-10-CM

## 2014-04-15 DIAGNOSIS — E1140 Type 2 diabetes mellitus with diabetic neuropathy, unspecified: Secondary | ICD-10-CM

## 2014-04-15 DIAGNOSIS — E785 Hyperlipidemia, unspecified: Secondary | ICD-10-CM

## 2014-04-15 DIAGNOSIS — E119 Type 2 diabetes mellitus without complications: Secondary | ICD-10-CM

## 2014-04-15 DIAGNOSIS — E1149 Type 2 diabetes mellitus with other diabetic neurological complication: Secondary | ICD-10-CM

## 2014-04-15 LAB — GLUCOSE, POCT (MANUAL RESULT ENTRY): POC GLUCOSE: 105 mg/dL — AB (ref 70–99)

## 2014-04-15 MED ORDER — AMITRIPTYLINE HCL 50 MG PO TABS: 50.0000 mg | ORAL_TABLET | Freq: Every day | ORAL | Status: DC

## 2014-04-15 NOTE — Patient Instructions (Addendum)
Diabetes Mellitus and Food It is important for you to manage your blood sugar (glucose) level. Your blood glucose level can be greatly affected by what you eat. Eating healthier foods in the appropriate amounts throughout the day at about the same time each day will help you control your blood glucose level. It can also help slow or prevent worsening of your diabetes mellitus. Healthy eating may even help you improve the level of your blood pressure and reach or maintain a healthy weight.  HOW CAN FOOD AFFECT ME? Carbohydrates Carbohydrates affect your blood glucose level more than any other type of food. Your dietitian will help you determine how many carbohydrates to eat at each meal and teach you how to count carbohydrates. Counting carbohydrates is important to keep your blood glucose at a healthy level, especially if you are using insulin or taking certain medicines for diabetes mellitus. Alcohol Alcohol can cause sudden decreases in blood glucose (hypoglycemia), especially if you use insulin or take certain medicines for diabetes mellitus. Hypoglycemia can be a life-threatening condition. Symptoms of hypoglycemia (sleepiness, dizziness, and disorientation) are similar to symptoms of having too much alcohol.  If your health care provider has given you approval to drink alcohol, do so in moderation and use the following guidelines:  Women should not have more than one drink per day, and men should not have more than two drinks per day. One drink is equal to:  12 oz of beer.  5 oz of wine.  1 oz of hard liquor.  Do not drink on an empty stomach.  Keep yourself hydrated. Have water, diet soda, or unsweetened iced tea.  Regular soda, juice, and other mixers might contain a lot of carbohydrates and should be counted. WHAT FOODS ARE NOT RECOMMENDED? As you make food choices, it is important to remember that all foods are not the same. Some foods have fewer nutrients per serving than other  foods, even though they might have the same number of calories or carbohydrates. It is difficult to get your body what it needs when you eat foods with fewer nutrients. Examples of foods that you should avoid that are high in calories and carbohydrates but low in nutrients include:  Trans fats (most processed foods list trans fats on the Nutrition Facts label).  Regular soda.  Juice.  Candy.  Sweets, such as cake, pie, doughnuts, and cookies.  Fried foods. WHAT FOODS CAN I EAT? Have nutrient-rich foods, which will nourish your body and keep you healthy. The food you should eat also will depend on several factors, including:  The calories you need.  The medicines you take.  Your weight.  Your blood glucose level.  Your blood pressure level.  Your cholesterol level. You also should eat a variety of foods, including:  Protein, such as meat, poultry, fish, tofu, nuts, and seeds (lean animal proteins are best).  Fruits.  Vegetables.  Dairy products, such as milk, cheese, and yogurt (low fat is best).  Breads, grains, pasta, cereal, rice, and beans.  Fats such as olive oil, trans fat-free margarine, canola oil, avocado, and olives. DOES EVERYONE WITH DIABETES MELLITUS HAVE THE SAME MEAL PLAN? Because every person with diabetes mellitus is different, there is not one meal plan that works for everyone. It is very important that you meet with a dietitian who will help you create a meal plan that is just right for you. Document Released: 06/17/2005 Document Revised: 09/25/2013 Document Reviewed: 08/17/2013 [employer] Patient Information 2015 [employer]. This   information is not intended to replace advice given to you by your health care provider. Make sure you discuss any questions you have with your health care provider. DASH Eating Plan DASH stands for "Dietary Approaches to Stop Hypertension." The DASH eating plan is a healthy eating plan that has been shown to reduce high  blood pressure (hypertension). Additional health benefits may include reducing the risk of type 2 diabetes mellitus, heart disease, and stroke. The DASH eating plan may also help with weight loss. WHAT DO I NEED TO KNOW ABOUT THE DASH EATING PLAN? For the DASH eating plan, you will follow these general guidelines:  Choose foods with a percent daily value for sodium of less than 5% (as listed on the food label).  Use salt-free seasonings or herbs instead of table salt or sea salt.  Check with your health care provider or pharmacist before using salt substitutes.  Eat lower-sodium products, often labeled as "lower sodium" or "no salt added."  Eat fresh foods.  Eat more vegetables, fruits, and low-fat dairy products.  Choose whole grains. Look for the word "whole" as the first word in the ingredient list.  Choose fish and skinless chicken or turkey more often than red meat. Limit fish, poultry, and meat to 6 oz (170 g) each day.  Limit sweets, desserts, sugars, and sugary drinks.  Choose heart-healthy fats.  Limit cheese to 1 oz (28 g) per day.  Eat more home-cooked food and less restaurant, buffet, and fast food.  Limit fried foods.  Cook foods using methods other than frying.  Limit canned vegetables. If you do use them, rinse them well to decrease the sodium.  When eating at a restaurant, ask that your food be prepared with less salt, or no salt if possible. WHAT FOODS CAN I EAT? Seek help from a dietitian for individual calorie needs. Grains Whole grain or whole wheat bread. Brown rice. Whole grain or whole wheat pasta. Quinoa, bulgur, and whole grain cereals. Low-sodium cereals. Corn or whole wheat flour tortillas. Whole grain cornbread. Whole grain crackers. Low-sodium crackers. Vegetables Fresh or frozen vegetables (raw, steamed, roasted, or grilled). Low-sodium or reduced-sodium tomato and vegetable juices. Low-sodium or reduced-sodium tomato sauce and paste. Low-sodium  or reduced-sodium canned vegetables.  Fruits All fresh, canned (in natural juice), or frozen fruits. Meat and Other Protein Products Ground beef (85% or leaner), grass-fed beef, or beef trimmed of fat. Skinless chicken or turkey. Ground chicken or turkey. Pork trimmed of fat. All fish and seafood. Eggs. Dried beans, peas, or lentils. Unsalted nuts and seeds. Unsalted canned beans. Dairy Low-fat dairy products, such as skim or 1% milk, 2% or reduced-fat cheeses, low-fat ricotta or cottage cheese, or plain low-fat yogurt. Low-sodium or reduced-sodium cheeses. Fats and Oils Tub margarines without trans fats. Light or reduced-fat mayonnaise and salad dressings (reduced sodium). Avocado. Safflower, olive, or canola oils. Natural peanut or almond butter. Other Unsalted popcorn and pretzels. The items listed above may not be a complete list of recommended foods or beverages. Contact your dietitian for more options. WHAT FOODS ARE NOT RECOMMENDED? Grains White bread. White pasta. White rice. Refined cornbread. Bagels and croissants. Crackers that contain trans fat. Vegetables Creamed or fried vegetables. Vegetables in a cheese sauce. Regular canned vegetables. Regular canned tomato sauce and paste. Regular tomato and vegetable juices. Fruits Dried fruits. Canned fruit in light or heavy syrup. Fruit juice. Meat and Other Protein Products Fatty cuts of meat. Ribs, chicken wings, bacon, sausage, bologna, salami, chitterlings, fatback, hot   dogs, bratwurst, and packaged luncheon meats. Salted nuts and seeds. Canned beans with salt. Dairy Whole or 2% milk, cream, half-and-half, and cream cheese. Whole-fat or sweetened yogurt. Full-fat cheeses or blue cheese. Nondairy creamers and whipped toppings. Processed cheese, cheese spreads, or cheese curds. Condiments Onion and garlic salt, seasoned salt, table salt, and sea salt. Canned and packaged gravies. Worcestershire sauce. Tartar sauce. Barbecue sauce.  Teriyaki sauce. Soy sauce, including reduced sodium. Steak sauce. Fish sauce. Oyster sauce. Cocktail sauce. Horseradish. Ketchup and mustard. Meat flavorings and tenderizers. Bouillon cubes. Hot sauce. Tabasco sauce. Marinades. Taco seasonings. Relishes. Fats and Oils Butter, stick margarine, lard, shortening, ghee, and bacon fat. Coconut, palm kernel, or palm oils. Regular salad dressings. Other Pickles and olives. Salted popcorn and pretzels. The items listed above may not be a complete list of foods and beverages to avoid. Contact your dietitian for more information. WHERE CAN I FIND MORE INFORMATION? National Heart, Lung, and Blood Institute: travelstabloid.com Document Released: 09/09/2011 Document Revised: 09/25/2013 Document Reviewed: 07/25/2013 [facility] Patient Information 2015 [place], [place]. This information is not intended to replace advice given to you by your health care provider. Make sure you discuss any questions you have with your health care provider. Amitriptyline tablets What is this medicine? AMITRIPTYLINE (a mee TRIP ti leen) is used to treat depression. This medicine may be used for other purposes; ask your health care provider or pharmacist if you have questions. COMMON BRAND NAME(S): Elavil, Vanatrip What should I tell my health care provider before I take this medicine? They need to know if you have any of these conditions: -an alcohol problem -asthma, difficulty breathing -bipolar disorder or schizophrenia -difficulty passing urine, prostate trouble -glaucoma -heart disease or previous heart attack -liver disease -over active thyroid -seizures -thoughts or plans of suicide, a previous suicide attempt, or family history of suicide attempt -an unusual or allergic reaction to amitriptyline, other medicines, foods, dyes, or preservatives -pregnant or trying to get pregnant -breast-feeding How should I use this medicine? Take this  medicine by mouth with a drink of water. Follow the directions on the prescription label. You can take the tablets with or without food. Take your medicine at regular intervals. Do not take it more often than directed. Do not stop taking this medicine suddenly except upon the advice of your doctor. Stopping this medicine too quickly may cause serious side effects or your condition may worsen. A special MedGuide will be given to you by the pharmacist with each prescription and refill. Be sure to read this information carefully each time. Talk to your pediatrician regarding the use of this medicine in children. Special care may be needed. Overdosage: If you think you have taken too much of this medicine contact a poison control center or emergency room at once. NOTE: This medicine is only for you. Do not share this medicine with others. What if I miss a dose? If you miss a dose, take it as soon as you can. If it is almost time for your next dose, take only that dose. Do not take double or extra doses. What may interact with this medicine? Do not take this medicine with any of the following medications: -arsenic trioxide -certain medicines used to regulate abnormal heartbeat or to treat other heart conditions -cisapride -droperidol -halofantrine -linezolid -MAOIs like Carbex, Eldepryl, Marplan, Nardil, and Parnate -methylene blue -other medicines for mental depression -phenothiazines like perphenazine, thioridazine and chlorpromazine -pimozide -probucol -procarbazine -sparfloxacin -St. John's Wort -ziprasidone This medicine may also interact with  the following medications: -atropine and related drugs like hyoscyamine, scopolamine, tolterodine and others -barbiturate medicines for inducing sleep or treating seizures, like phenobarbital -cimetidine -disulfiram -ethchlorvynol -thyroid hormones such as levothyroxine This list may not describe all possible interactions. Give your health care  provider a list of all the medicines, herbs, non-prescription drugs, or dietary supplements you use. Also tell them if you smoke, drink alcohol, or use illegal drugs. Some items may interact with your medicine. What should I watch for while using this medicine? Tell your doctor if your symptoms do not get better or if they get worse. Visit your doctor or health care professional for regular checks on your progress. Because it may take several weeks to see the full effects of this medicine, it is important to continue your treatment as prescribed by your doctor. Patients and their families should watch out for new or worsening thoughts of suicide or depression. Also watch out for sudden changes in feelings such as feeling anxious, agitated, panicky, irritable, hostile, aggressive, impulsive, severely restless, overly excited and hyperactive, or not being able to sleep. If this happens, especially at the beginning of treatment or after a change in dose, call your health care professional. Dennis Bast may get drowsy or dizzy. Do not drive, use machinery, or do anything that needs mental alertness until you know how this medicine affects you. Do not stand or sit up quickly, especially if you are an older patient. This reduces the risk of dizzy or fainting spells. Alcohol may interfere with the effect of this medicine. Avoid alcoholic drinks. Do not treat yourself for coughs, colds, or allergies without asking your doctor or health care professional for advice. Some ingredients can increase possible side effects. Your mouth may get dry. Chewing sugarless gum or sucking hard candy, and drinking plenty of water will help. Contact your doctor if the problem does not go away or is severe. This medicine may cause dry eyes and blurred vision. If you wear contact lenses you may feel some discomfort. Lubricating drops may help. See your eye doctor if the problem does not go away or is severe. This medicine can cause  constipation. Try to have a bowel movement at least every 2 to 3 days. If you do not have a bowel movement for 3 days, call your doctor or health care professional. This medicine can make you more sensitive to the sun. Keep out of the sun. If you cannot avoid being in the sun, wear protective clothing and use sunscreen. Do not use sun lamps or tanning beds/booths. What side effects may I notice from receiving this medicine? Side effects that you should report to your doctor or health care professional as soon as possible: -allergic reactions like skin rash, itching or hives, swelling of the face, lips, or tongue -abnormal production of milk in females -breast enlargement in both males and females -breathing problems -confusion, hallucinations -fast, irregular heartbeat -fever with increased sweating -muscle stiffness, or spasms -pain or difficulty passing urine, loss of bladder control -seizures -suicidal thoughts or other mood changes -swelling of the testicles -tingling, pain, or numbness in the feet or hands -yellowing of the eyes or skin Side effects that usually do not require medical attention (report to your doctor or health care professional if they continue or are bothersome): -change in sex drive or performance -constipation or diarrhea -nausea, vomiting -weight gain or loss This list may not describe all possible side effects. Call your doctor for medical advice about side effects. You may  report side effects to FDA at 1-800-FDA-1088. Where should I keep my medicine? Keep out of the reach of children. Store at room temperature between 20 and 25 degrees C (68 and 77 degrees F). Throw away any unused medicine after the expiration date. NOTE: This sheet is a summary. It may not cover all possible information. If you have questions about this medicine, talk to your doctor, pharmacist, or health care provider.  2015, [employer]. (2012-02-07 13:50:32)

## 2014-04-15 NOTE — Progress Notes (Signed)
Pt is here following up on her HTN and diabetes. Pt reports having numbness tingling and burning sensations in her legs, feet and hands. Pt has a boil on her left breast.

## 2014-04-15 NOTE — Progress Notes (Signed)
Patient ID: Caitlin Fritz, female   DOB: 07/12/1971, 43 y.o.   MRN: [id number]   Caitlin Fritz, is a 43 y.o. female  TDV:[id number]  [id number]:[id number]  DOB - 05/10/1971  Chief Complaint  Patient presents with  . Follow-up        Subjective:   Caitlin Fritz is a 43 y.o. female here today for a follow up visit. Patient has history of hypertension, dyslipidemia diabetes mellitus, and diabetic neuropathy, asthma, here today for her routine followup. She reports having numbness and tingling and burning sensation in her legs, feet and hands. 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 Patient has No headache, No chest pain, No abdominal pain - No Nausea, No new weakness tingling or numbness, No Cough - SOB.  Problem  Essential Hypertension    ALLERGIES: Allergies  Allergen Reactions  . Clonidine Derivatives Other (See Comments)    Seizures   . Prednisone Other (See Comments)    Eye drops only-temporary blindness     PAST MEDICAL HISTORY: Past Medical History  Diagnosis Date  . Hypertension   . Diabetes mellitus without complication   . Asthma     MEDICATIONS AT HOME: Prior to Admission medications   Medication Sig Start Date End Date Taking? Authorizing Provider  albuterol (PROVENTIL HFA;VENTOLIN HFA) 108 (90 BASE) MCG/ACT inhaler Inhale 2 puffs into the lungs every 4 (four) hours as needed for wheezing. 02/13/13  Yes Jennifer L Piepenbrink, PA-C  ALPRAZolam (XANAX) 0.5 MG tablet Take 1 tablet (0.5 mg total) by mouth at bedtime. 03/11/14  Yes Angelica Chessman, MD  amLODipine (NORVASC) 10 MG tablet Take 1 tablet (10 mg total) by mouth daily. 03/11/14  Yes Angelica Chessman, MD  gabapentin (NEURONTIN) 300 MG capsule Take 1 capsule (300 mg total) by mouth 3 (three) times daily. 03/11/14  Yes Angelica Chessman, MD  gentamicin (GARAMYCIN) 0.3 % ophthalmic solution Place 2  drops into the right eye 3 (three) times daily. 03/11/14  Yes Angelica Chessman, MD  glipiZIDE (GLUCOTROL) 10 MG tablet Take 1 tablet (10 mg total) by mouth 2 (two) times daily before a meal. 03/11/14  Yes Angelica Chessman, MD  glucose monitoring kit (FREESTYLE) monitoring kit 1 each by Does not apply route 4 (four) times daily - after meals and at bedtime. 1 month Diabetic Testing Supplies for QAC-QHS accuchecks. 06/11/13  Yes Thurnell Lose, MD  hydrALAZINE (APRESOLINE) 25 MG tablet Take 1 tablet (25 mg total) by mouth 3 (three) times daily. 03/11/14  Yes Angelica Chessman, MD  insulin NPH-regular Human (HUMULIN 70/30) (70-30) 100 UNIT/ML injection Inject 20 Units into the skin 2 (two) times daily with a meal. 03/11/14  Yes Angelica Chessman, MD  metFORMIN (GLUCOPHAGE) 1000 MG tablet Take 1 tablet (1,000 mg total) by mouth 2 (two) times daily. 03/11/14  Yes Angelica Chessman, MD  amitriptyline (ELAVIL) 50 MG tablet Take 1 tablet (50 mg total) by mouth at bedtime. 04/15/14   Angelica Chessman, MD  aspirin EC 81 MG EC tablet Take 1 tablet (81 mg total) by mouth daily. 11/03/13   Erline Hau, MD  doxycycline (VIBRA-TABS) 100 MG tablet Take 1 tablet (100 mg total) by mouth 2 (two) times daily. 03/11/14   Angelica Chessman, MD  famotidine (PEPCID) 20 MG tablet Take 1 tablet (20 mg total) by mouth 2 (two) times daily. 07/24/13   Angelica Chessman, MD     Objective:  Filed Vitals:   04/15/14 1451  BP: 146/90  Pulse: 90  Temp: 98 F (36.7 C)  TempSrc: Oral  Resp: 16  Height: 5' 6"  (1.676 m)  Weight: 238 lb (107.956 kg)  SpO2: 96%    Exam General appearance : Awake, alert, not in any distress. Speech Clear. Not toxic looking, obese HEENT: Atraumatic and Normocephalic, pupils equally reactive to light and accomodation Neck: supple, no JVD. No cervical lymphadenopathy.  Chest:Good air entry bilaterally, no added sounds  CVS: S1 S2 regular, no murmurs.  Abdomen: Bowel sounds present, Non  tender and not distended with no gaurding, rigidity or rebound. Extremities: B/L Lower Ext shows no edema, both legs are warm to touch Neurology: Awake alert, and oriented X 3, CN II-XII intact, Non focal Skin:No Rash Wounds:N/A  Data Review Lab Results  Component Value Date   HGBA1C 8.0 03/11/2014   HGBA1C 8.3 01/07/2014   HGBA1C 10.5* 11/03/2013     Assessment & Plan   1. Type 2 diabetes mellitus without complication Last hemoglobin A1c in June was 8.0% Patient claims compliant with medication Patient to continue current regimen of insulin 70/30 and metformin - Glucose (CBG)  2. Essential hypertension Continue amlodipine and hydralazine at the current dose DASH diet  3. Dyslipidemia Patient was counseled extensively on nutrition and exercise  4. Diabetic neuropathy, painful We'll try - amitriptyline (ELAVIL) 50 MG tablet; Take 1 tablet (50 mg total) by mouth at bedtime.  Dispense: 30 tablet; Refill: 3  5. Breast cancer screening  - MM Digital Screening; Future  Patient was counseled extensively on nutrition and exercise Return in about 2 months (around 06/16/2014), or if symptoms worsen or fail to improve, for Pap Smear, Hemoglobin A1C and Follow up, DM, Follow up HTN.  The patient was given clear instructions to go to ER or return to medical center if symptoms don't improve, worsen or new problems develop. The patient verbalized understanding. The patient was told to call to get lab results if they haven't heard anything in the next week.   This note has been created with Surveyor, quantity. Any transcriptional errors are unintentional.    Angelica Chessman, MD, [place], [place], [place] and [place], [place]   04/15/2014, 3:39 PM

## 2014-04-29 ENCOUNTER — Ambulatory Visit: Payer: BC Managed Care – PPO | Admitting: Internal Medicine

## 2014-05-06 ENCOUNTER — Ambulatory Visit: Payer: BC Managed Care – PPO | Attending: Internal Medicine | Admitting: Internal Medicine

## 2014-05-06 ENCOUNTER — Other Ambulatory Visit (HOSPITAL_COMMUNITY)
Admission: RE | Admit: 2014-05-06 | Discharge: 2014-05-06 | Disposition: A | Discharge status: Home / Self Care | Payer: BC Managed Care – PPO | Source: Ambulatory Visit | Attending: Internal Medicine | Admitting: Internal Medicine

## 2014-05-06 ENCOUNTER — Encounter: Payer: Self-pay | Admitting: Internal Medicine

## 2014-05-06 VITALS — BP 131/82 | HR 114 | Temp 99.3°F | Resp 18 | Ht 65.0 in | Wt 239.0 lb

## 2014-05-06 DIAGNOSIS — E1310 Other specified diabetes mellitus with ketoacidosis without coma: Secondary | ICD-10-CM

## 2014-05-06 DIAGNOSIS — I10 Essential (primary) hypertension: Secondary | ICD-10-CM | POA: Insufficient documentation

## 2014-05-06 DIAGNOSIS — E785 Hyperlipidemia, unspecified: Secondary | ICD-10-CM

## 2014-05-06 DIAGNOSIS — E119 Type 2 diabetes mellitus without complications: Secondary | ICD-10-CM | POA: Insufficient documentation

## 2014-05-06 DIAGNOSIS — J45909 Unspecified asthma, uncomplicated: Secondary | ICD-10-CM | POA: Diagnosis not present

## 2014-05-06 DIAGNOSIS — E1110 Type 2 diabetes mellitus with ketoacidosis without coma: Secondary | ICD-10-CM

## 2014-05-06 DIAGNOSIS — Z01419 Encounter for gynecological examination (general) (routine) without abnormal findings: Secondary | ICD-10-CM | POA: Insufficient documentation

## 2014-05-06 DIAGNOSIS — Z124 Encounter for screening for malignant neoplasm of cervix: Secondary | ICD-10-CM | POA: Diagnosis not present

## 2014-05-06 DIAGNOSIS — Z7982 Long term (current) use of aspirin: Secondary | ICD-10-CM | POA: Insufficient documentation

## 2014-05-06 DIAGNOSIS — Z888 Allergy status to other drugs, medicaments and biological substances status: Secondary | ICD-10-CM | POA: Diagnosis not present

## 2014-05-06 DIAGNOSIS — Z1151 Encounter for screening for human papillomavirus (HPV): Secondary | ICD-10-CM | POA: Insufficient documentation

## 2014-05-06 MED ORDER — FAMOTIDINE 20 MG PO TABS: 20.0000 mg | ORAL_TABLET | Freq: Two times a day (BID) | ORAL | Status: DC

## 2014-05-06 MED ORDER — GENTAMICIN SULFATE 0.3 % OP SOLN: 2.0000 [drp] | Freq: Three times a day (TID) | OPHTHALMIC | Status: DC

## 2014-05-06 NOTE — Progress Notes (Signed)
Pt here for pap smear screening and physical States she is feeling better with diabetes meds adjustments Cbg at  Home 94,105 Refused STD check today

## 2014-05-06 NOTE — Patient Instructions (Signed)
Dyslipidemia  Dyslipidemia is an imbalance of the lipids in your blood. Lipids are waxy, fat-like proteins that your body needs in small amounts. Dyslipidemia often involves the lipids cholesterol or triglycerides. Common forms of dyslipidemia are:  · High levels of bad cholesterol (LDL cholesterol). LDL cholesterol is the type of cholesterol that causes heart disease.  · Low levels of good cholesterol (HDL cholesterol). HDL cholesterol is the type of cholesterol that helps protect against heart disease.  · High levels of triglycerides. Triglycerides are a fatty substance in the blood linked to a buildup of plaque on your arteries.  RISK FACTORS  · Increased age.  · Having a family history of high cholesterol.  · Certain medicines, including birth control pills, diuretics, beta-blockers, and some medicines for depression.  · Smoking.  · Eating a high-fat diet.  · Being overweight.  · Medical conditions such as diabetes, polycystic ovary syndrome, pregnancy, kidney disease, and hypothyroidism.  · Lack of regular exercise.  SIGNS AND SYMPTOMS  There are no signs or symptoms with dyslipidemia.   DIAGNOSIS   A simple blood test called a fasting blood test can be done to determine your level of:  · Total cholesterol. This is the combined number of LDL cholesterol and HDL cholesterol. A healthy number is lower than 200.  · LDL cholesterol. The goal number for LDL cholesterol is different for each person depending on risk factors. Ask your health care provider what your LDL cholesterol number should be.  · HDL cholesterol. A healthy level of HDL cholesterol is 60 or higher. A number lower than 40 for men or 50 for women is a danger sign.  · Triglycerides. A healthy triglyceride number is less than 150.  TREATMENT   Dyslipidemia is a treatable condition. Your health care provider will advise you on what type of treatment is best based on your age, your test results, and current guidelines. Treatment may include:   · Dietary  changes. A dietitian can help you create a meal plan. You may need to:  ¨ Eat more foods that contain omega-3s, such as salmon and other fish.  ¨ Replace saturated fats and trans fats in your diet with healthy fats such as nuts, seeds, avocados, olive oil, and canola oil.  · Regular exercise. This can help lower your LDL cholesterol, raise your HDL cholesterol, and help with weight management. Check with your health care provider before beginning an exercise program. Most people should participate in 30 minutes of brisk exercise 5 days a week.  · Quitting smoking.  · Medicines to lower LDL cholesterol and triglycerides.  Your health care provider will monitor your lipid levels with regular blood tests.  HOME CARE INSTRUCTIONS  · Eat a healthy diet. Follow any diet instructions if they were given to you by your health care provider.  · Maintain a healthy weight.  · Exercise regularly based on the recommendations of your health care provider.  · Do not use any tobacco products, including cigarettes, chewing tobacco, or electronic cigarettes.  · Take medicines only as directed by your health care provider.  · Keep all follow-up visits as directed by your health care provider.  SEEK MEDICAL CARE IF:  You are having possible side effects from your medicines.  Document Released: 09/25/2013 Document Revised: 02/04/2014 Document Reviewed: 09/25/2013  Exit[employer]® Patient Information ©2015 [employer]. This information is not intended to replace advice given to you by your health care provider. Make sure you discuss any questions you have   with your health care provider.

## 2014-05-06 NOTE — Progress Notes (Signed)
Patient ID: Caitlin Fritz, female   DOB: [birth year], 43 y.o.   MRN: [id number]   Caitlin Fritz, is a 43 y.o. female  [id number]  [id number]  DOB - June 07, 1971  Chief Complaint  Patient presents with  . Follow-up  . Annual Exam  . Gynecologic Exam        Subjective:   Caitlin Fritz is a 43 y.o. female here today for a follow up visit.Patient has history of hypertension, diabetes mellitus and asthma on medications as listed below. Pt here for pap smear screening and physical. States she is feeling better with diabetes meds adjustments. Cbg at Home 94,105. Refused STD check today. Patient has No headache, No chest pain, No abdominal pain - No Nausea, No new weakness tingling or numbness, No Cough - SOB.  No problems updated.  ALLERGIES: Allergies  Allergen Reactions  . Clonidine Derivatives Other (See Comments)    Seizures   . Prednisone Other (See Comments)    Eye drops only-temporary blindness     PAST MEDICAL HISTORY: Past Medical History  Diagnosis Date  . Hypertension   . Diabetes mellitus without complication   . Asthma     MEDICATIONS AT HOME: Prior to Admission medications   Medication Sig Start Date End Date Taking? Authorizing Provider  albuterol (PROVENTIL HFA;VENTOLIN HFA) 108 (90 BASE) MCG/ACT inhaler Inhale 2 puffs into the lungs every 4 (four) hours as needed for wheezing. 02/13/13  Yes Jennifer L Piepenbrink, PA-C  ALPRAZolam (XANAX) 0.5 MG tablet Take 1 tablet (0.5 mg total) by mouth at bedtime. 03/11/14  Yes Angelica Chessman, MD  amitriptyline (ELAVIL) 50 MG tablet Take 1 tablet (50 mg total) by mouth at bedtime. 04/15/14  Yes Angelica Chessman, MD  amLODipine (NORVASC) 10 MG tablet Take 1 tablet (10 mg total) by mouth daily. 03/11/14  Yes Angelica Chessman, MD  gabapentin (NEURONTIN) 300 MG capsule Take 1 capsule (300 mg total) by mouth 3 (three) times daily. 03/11/14  Yes Angelica Chessman, MD  glipiZIDE (GLUCOTROL) 10 MG tablet Take 1  tablet (10 mg total) by mouth 2 (two) times daily before a meal. 03/11/14  Yes Angelica Chessman, MD  hydrALAZINE (APRESOLINE) 25 MG tablet Take 1 tablet (25 mg total) by mouth 3 (three) times daily. 03/11/14  Yes Angelica Chessman, MD  insulin NPH-regular Human (HUMULIN 70/30) (70-30) 100 UNIT/ML injection Inject 20 Units into the skin 2 (two) times daily with a meal. 03/11/14  Yes Angelica Chessman, MD  metFORMIN (GLUCOPHAGE) 1000 MG tablet Take 1 tablet (1,000 mg total) by mouth 2 (two) times daily. 03/11/14  Yes Angelica Chessman, MD  aspirin EC 81 MG EC tablet Take 1 tablet (81 mg total) by mouth daily. 11/03/13   Erline Hau, MD  doxycycline (VIBRA-TABS) 100 MG tablet Take 1 tablet (100 mg total) by mouth 2 (two) times daily. 03/11/14   Angelica Chessman, MD  famotidine (PEPCID) 20 MG tablet Take 1 tablet (20 mg total) by mouth 2 (two) times daily. 05/06/14   Angelica Chessman, MD  gentamicin (GARAMYCIN) 0.3 % ophthalmic solution Place 2 drops into the right eye 3 (three) times daily. 05/06/14   Angelica Chessman, MD  glucose monitoring kit (FREESTYLE) monitoring kit 1 each by Does not apply route 4 (four) times daily - after meals and at bedtime. 1 month Diabetic Testing Supplies for QAC-QHS accuchecks. 06/11/13   Thurnell Lose, MD     Objective:   Filed Vitals:   05/06/14 1454  BP: 131/82  Pulse: 114  Temp:  99.3 F (37.4 C)  TempSrc: Oral  Resp: 18  Height: 5' 5"  (1.651 m)  Weight: 239 lb (108.41 kg)  SpO2: 98%    Exam General appearance : Awake, alert, not in any distress. Speech Clear. Not toxic looking HEENT: Atraumatic and Normocephalic, pupils equally reactive to light and accomodation Neck: supple, no JVD. No cervical lymphadenopathy.  Chest:Good air entry bilaterally, no added sounds  CVS: S1 S2 regular, no murmurs.  Abdomen: Bowel sounds present, Non tender and not distended with no gaurding, rigidity or rebound. Extremities: B/L Lower Ext shows no edema, both legs  are warm to touch Neurology: Awake alert, and oriented X 3, CN II-XII intact, Non focal Pelvic examination: Normal female external genitalia, minimal discharge in introitus, central cervix, negative cervical motion tenderness  Data Review Lab Results  Component Value Date   HGBA1C 8.0 03/11/2014   HGBA1C 8.3 01/07/2014   HGBA1C 10.5* 11/03/2013     Assessment & Plan   1. DM (diabetes mellitus) type 2, uncontrolled, with ketoacidosis Continue current regimen of insulin and oral hypoglycemics - Glucose (CBG)  - gentamicin (GARAMYCIN) 0.3 % ophthalmic solution; Place 2 drops into the right eye 3 (three) times daily.  Dispense: 5 mL; Refill: 0  2. Dyslipidemia  - famotidine (PEPCID) 20 MG tablet; Take 1 tablet (20 mg total) by mouth 2 (two) times daily.  Dispense: 60 tablet; Refill: 3  3. Cervical cancer screening  - Cytology - PAP  Patient was counseled extensively about nutrition and exercise Patient was counseled extensively about smoking cessation  Return in about 2 months (around 07/06/2014), or if symptoms worsen or fail to improve, for Hemoglobin A1C and Follow up, DM, Follow up HTN.  The patient was given clear instructions to go to ER or return to medical center if symptoms don't improve, worsen or new problems develop. The patient verbalized understanding. The patient was told to call to get lab results if they haven't heard anything in the next week.   This note has been created with Surveyor, quantity. Any transcriptional errors are unintentional.    Angelica Chessman, MD, [place], [place], [place] and [place], [place]   05/06/2014, 4:02 PM

## 2014-05-08 LAB — CYTOLOGY - PAP

## 2014-05-09 ENCOUNTER — Ambulatory Visit: Payer: BC Managed Care – PPO

## 2014-05-09 VITALS — BP 142/89 | HR 117 | Temp 98.4°F | Resp 16

## 2014-05-09 MED ORDER — FLUCONAZOLE 200 MG PO TABS: 200.0000 mg | ORAL_TABLET | Freq: Every day | ORAL | Status: DC

## 2014-05-09 MED ORDER — METRONIDAZOLE 500 MG PO TABS: 2000.0000 mg | ORAL_TABLET | Freq: Once | ORAL | Status: DC

## 2014-05-09 MED ORDER — AMOXICILLIN-POT CLAVULANATE 875-125 MG PO TABS: 1.0000 | ORAL_TABLET | Freq: Two times a day (BID) | ORAL | Status: DC

## 2014-05-09 NOTE — Patient Instructions (Signed)
Metronidazole tablets or capsules What is this medicine? METRONIDAZOLE (me troe NI da zole) is an antiinfective. It is used to treat certain kinds of bacterial and protozoal infections. It will not work for colds, flu, or other viral infections. This medicine may be used for other purposes; ask your health care provider or pharmacist if you have questions. COMMON BRAND NAME(S): Flagyl What should I tell my health care provider before I take this medicine? They need to know if you have any of these conditions: -anemia or other blood disorders -disease of the nervous system -fungal or yeast infection -if you drink alcohol containing drinks -liver disease -seizures -an unusual or allergic reaction to metronidazole, or other medicines, foods, dyes, or preservatives -pregnant or trying to get pregnant -breast-feeding How should I use this medicine? Take this medicine by mouth with a full glass of water. Follow the directions on the prescription label. Take your medicine at regular intervals. Do not take your medicine more often than directed. Take all of your medicine as directed even if you think you are better. Do not skip doses or stop your medicine early. Talk to your pediatrician regarding the use of this medicine in children. Special care may be needed. Overdosage: If you think you have taken too much of this medicine contact a poison control center or emergency room at once. NOTE: This medicine is only for you. Do not share this medicine with others. What if I miss a dose? If you miss a dose, take it as soon as you can. If it is almost time for your next dose, take only that dose. Do not take double or extra doses. What may interact with this medicine? Do not take this medicine with any of the following medications: -alcohol or any product that contains alcohol -amprenavir oral solution -cisapride -disulfiram -dofetilide -dronedarone -paclitaxel injection -pimozide -ritonavir oral  solution -sertraline oral solution -sulfamethoxazole-trimethoprim injection -thioridazine -ziprasidone This medicine may also interact with the following medications: -birth control pills -cimetidine -lithium -other medicines that prolong the QT interval (cause an abnormal heart rhythm) -phenobarbital -phenytoin -warfarin This list may not describe all possible interactions. Give your health care provider a list of all the medicines, herbs, non-prescription drugs, or dietary supplements you use. Also tell them if you smoke, drink alcohol, or use illegal drugs. Some items may interact with your medicine. What should I watch for while using this medicine? Tell your doctor or health care professional if your symptoms do not improve or if they get worse. You may get drowsy or dizzy. Do not drive, use machinery, or do anything that needs mental alertness until you know how this medicine affects you. Do not stand or sit up quickly, especially if you are an older patient. This reduces the risk of dizzy or fainting spells. Avoid alcoholic drinks while you are taking this medicine and for three days afterward. Alcohol may make you feel dizzy, sick, or flushed. If you are being treated for a sexually transmitted disease, avoid sexual contact until you have finished your treatment. Your sexual partner may also need treatment. What side effects may I notice from receiving this medicine? Side effects that you should report to your doctor or health care professional as soon as possible: -allergic reactions like skin rash or hives, swelling of the face, lips, or tongue -confusion, clumsiness -difficulty speaking -discolored or sore mouth -dizziness -fever, infection -numbness, tingling, pain or weakness in the hands or feet -trouble passing urine or change in the amount of   urine -redness, blistering, peeling or loosening of the skin, including inside the mouth -seizures -unusually weak or  tired -vaginal irritation, dryness, or discharge Side effects that usually do not require medical attention (report to your doctor or health care professional if they continue or are bothersome): -diarrhea -headache -irritability -metallic taste -nausea -stomach pain or cramps -trouble sleeping This list may not describe all possible side effects. Call your doctor for medical advice about side effects. You may report side effects to FDA at 1-800-FDA-1088. Where should I keep my medicine? Keep out of the reach of children. Store at room temperature below 25 degrees C (77 degrees F). Protect from light. Keep container tightly closed. Throw away any unused medicine after the expiration date. NOTE: This sheet is a summary. It may not cover all possible information. If you have questions about this medicine, talk to your doctor, pharmacist, or health care provider.  2015, [employer]vier/Gold Standard. (2013-04-27 14:08:39) Trichomoniasis Trichomoniasis is an infection caused by an organism called Trichomonas. The infection can affect both women and men. In women, the outer female genitalia and the vagina are affected. In men, the penis is mainly affected, but the prostate and other reproductive organs can also be involved. Trichomoniasis is a sexually transmitted infection (STI) and is most often passed to another person through sexual contact.  RISK FACTORS  Having unprotected sexual intercourse.  Having sexual intercourse with an infected partner. SIGNS AND SYMPTOMS  Symptoms of trichomoniasis in women include:  Abnormal gray-green frothy vaginal discharge.  Itching and irritation of the vagina.  Itching and irritation of the area outside the vagina. Symptoms of trichomoniasis in men include:   Penile discharge with or without pain.  Pain during urination. This results from inflammation of the urethra. DIAGNOSIS  Trichomoniasis may be found during a Pap test or physical exam. Your health  care provider may use one of the following methods to help diagnose this infection:  Examining vaginal discharge under a microscope. For men, urethral discharge would be examined.  Testing the pH of the vagina with a test tape.  Using a vaginal swab test that checks for the Trichomonas organism. A test is available that provides results within a few minutes.  Doing a culture test for the organism. This is not usually needed. TREATMENT   You may be given medicine to fight the infection. Women should inform their health care provider if they could be or are pregnant. Some medicines used to treat the infection should not be taken during pregnancy.  Your health care provider may recommend over-the-counter medicines or creams to decrease itching or irritation.  Your sexual partner will need to be treated if infected. HOME CARE INSTRUCTIONS   Take medicines only as directed by your health care provider.  Take over-the-counter medicine for itching or irritation as directed by your health care provider.  Do not have sexual intercourse while you have the infection.  Women should not douche or wear tampons while they have the infection.  Discuss your infection with your partner. Your partner may have gotten the infection from you, or you may have gotten it from your partner.  Have your sex partner get examined and treated if necessary.  Practice safe, informed, and protected sex.  See your health care provider for other STI testing. SEEK MEDICAL CARE IF:   You still have symptoms after you finish your medicine.  You develop abdominal pain.  You have pain when you urinate.  You have bleeding after sexual intercourse.  You develop a rash.  Your medicine makes you sick or makes you throw up (vomit). MAKE SURE YOU:  Understand these instructions.  Will watch your condition.  Will get help right away if you are not doing well or get worse. Document Released: 03/16/2001 Document  Revised: 02/04/2014 Document Reviewed: 07/02/2013 [facility] Patient Information 2015 [place], [place]. This information is not intended to replace advice given to you by your health care provider. Make sure you discuss any questions you have with your health care provider. Abscess An abscess is an infected area that contains a collection of pus and debris.It can occur in almost any part of the body. An abscess is also known as a furuncle or boil. CAUSES  An abscess occurs when tissue gets infected. This can occur from blockage of oil or sweat glands, infection of hair follicles, or a minor injury to the skin. As the body tries to fight the infection, pus collects in the area and creates pressure under the skin. This pressure causes pain. People with weakened immune systems have difficulty fighting infections and get certain abscesses more often.  SYMPTOMS Usually an abscess develops on the skin and becomes a painful mass that is red, warm, and tender. If the abscess forms under the skin, you may feel a moveable soft area under the skin. Some abscesses break open (rupture) on their own, but most will continue to get worse without care. The infection can spread deeper into the body and eventually into the bloodstream, causing you to feel ill.  DIAGNOSIS  Your caregiver will take your medical history and perform a physical exam. A sample of fluid may also be taken from the abscess to determine what is causing your infection. TREATMENT  Your caregiver may prescribe antibiotic medicines to fight the infection. However, taking antibiotics alone usually does not cure an abscess. Your caregiver may need to make a small cut (incision) in the abscess to drain the pus. In some cases, gauze is packed into the abscess to reduce pain and to continue draining the area. HOME CARE INSTRUCTIONS   Only take over-the-counter or prescription medicines for pain, discomfort, or fever as directed by your caregiver.  If you  were prescribed antibiotics, take them as directed. Finish them even if you start to feel better.  If gauze is used, follow your caregiver's directions for changing the gauze.  To avoid spreading the infection:  Keep your draining abscess covered with a bandage.  Wash your hands well.  Do not share personal care items, towels, or whirlpools with others.  Avoid skin contact with others.  Keep your skin and clothes clean around the abscess.  Keep all follow-up appointments as directed by your caregiver. SEEK MEDICAL CARE IF:   You have increased pain, swelling, redness, fluid drainage, or bleeding.  You have muscle aches, chills, or a general ill feeling.  You have a fever. MAKE SURE YOU:   Understand these instructions.  Will watch your condition.  Will get help right away if you are not doing well or get worse. Document Released: 06/30/2005 Document Revised: 03/21/2012 Document Reviewed: 12/03/2011 [facility] Patient Information 2015 [place], [place]. This information is not intended to replace advice given to you by your health care provider. Make sure you discuss any questions you have with your health care provider.

## 2014-05-09 NOTE — Progress Notes (Unsigned)
Patient presents to walk in clinic with a boil on the back of the neck. Patient states she this started on Tuesday. Patient states she is having pain on the left side of her neck and left arm. Consulted with Dr. Doreene Burke. Verbal order given for Augmentin 875 mg twice a day for 10 days and for Flagyl 2 G for trich from Pap on 05/06/2014. Informed patient of the plan of care and her diagnosis of Trich. Informed patient about ETOH precautions, no sex for 7 days, and her partner needs to be treated. Patient verbalized agreement. Vivia Birmingham, RN

## 2014-05-15 ENCOUNTER — Emergency Department (HOSPITAL_COMMUNITY)
Admission: EM | Admit: 2014-05-15 | Discharge: 2014-05-16 | Disposition: A | Discharge status: Home / Self Care | Payer: BC Managed Care – PPO | Attending: Emergency Medicine | Admitting: Emergency Medicine

## 2014-05-15 ENCOUNTER — Encounter (HOSPITAL_COMMUNITY): Payer: Self-pay | Admitting: Emergency Medicine

## 2014-05-15 DIAGNOSIS — Z79899 Other long term (current) drug therapy: Secondary | ICD-10-CM | POA: Insufficient documentation

## 2014-05-15 DIAGNOSIS — I10 Essential (primary) hypertension: Secondary | ICD-10-CM | POA: Insufficient documentation

## 2014-05-15 DIAGNOSIS — Z792 Long term (current) use of antibiotics: Secondary | ICD-10-CM | POA: Diagnosis not present

## 2014-05-15 DIAGNOSIS — L03221 Cellulitis of neck: Secondary | ICD-10-CM | POA: Diagnosis present

## 2014-05-15 DIAGNOSIS — Z794 Long term (current) use of insulin: Secondary | ICD-10-CM | POA: Insufficient documentation

## 2014-05-15 DIAGNOSIS — J45909 Unspecified asthma, uncomplicated: Secondary | ICD-10-CM | POA: Diagnosis not present

## 2014-05-15 DIAGNOSIS — F17200 Nicotine dependence, unspecified, uncomplicated: Secondary | ICD-10-CM | POA: Diagnosis not present

## 2014-05-15 DIAGNOSIS — L0211 Cutaneous abscess of neck: Secondary | ICD-10-CM | POA: Diagnosis not present

## 2014-05-15 DIAGNOSIS — E119 Type 2 diabetes mellitus without complications: Secondary | ICD-10-CM | POA: Insufficient documentation

## 2014-05-15 MED ORDER — LIDOCAINE-EPINEPHRINE (PF) 2 %-1:200000 IJ SOLN
10.0000 mL | Freq: Once | INTRAMUSCULAR | Status: AC
  Administered 2014-05-16: 10 mL

## 2014-05-15 MED ORDER — ONDANSETRON 8 MG PO TBDP
8.0000 mg | ORAL_TABLET | Freq: Once | ORAL | Status: AC
  Administered 2014-05-16: 8 mg via ORAL
  Filled 2014-05-15: qty 1

## 2014-05-15 NOTE — ED Provider Notes (Signed)
CSN: [id number]     Arrival date & time 05/15/14  2139 History  This chart was scribed for a non-physician practitioner, Antonietta Breach, PA-C, working with Ernestina Patches, MD by Cathie Hoops, ED Scribe. The patient was seen in WTR9/WTR9. The patient's care was started at 11:05 PM.   Chief Complaint  Patient presents with  . Abscess   The history is provided by the patient. No language interpreter was used.   HPI Comments: Caitlin Fritz is a 43 y.o. female who presents to the Emergency Department complaining of new, moderate, gradually worsening abscess on her left neck. Pt reports going to see her PCP on 8/3 and was told to use a warm compress and ibuprofen. Pt reports her abscess was worsened after using the warm compress. Pt denies trauma or injury. Pt reports associated neck pain and states is difficult to rotate her neck. Pt has past medical history of hydradenitis. Pt reports using 875 MG of Amoxil. Pt reports previously using doxycycline without relief. Pt denies fever, chills, drainage from the wound, numbness, weakness.    Past Medical History  Diagnosis Date  . Hypertension   . Diabetes mellitus without complication   . Asthma    History reviewed. No pertinent past surgical history. Family History  Problem Relation Age of Onset  . Diabetes Neg Hx    History  Substance Use Topics  . Smoking status: Current Every Day Smoker  . Smokeless tobacco: Not on file  . Alcohol Use: Yes     Comment: occ   OB History   Grav Para Term Preterm Abortions TAB SAB Ect Mult Living                 Review of Systems  Constitutional: Negative for fever and chills.  Musculoskeletal: Positive for arthralgias and neck pain.  Skin: Positive for wound (abscess on left neck).  Neurological: Negative for weakness and numbness.  All other systems reviewed and are negative.  Allergies  Clonidine derivatives and Prednisone  Home Medications   Prior to Admission medications   Medication Sig  Start Date End Date Taking? Authorizing Provider  albuterol (PROVENTIL HFA;VENTOLIN HFA) 108 (90 BASE) MCG/ACT inhaler Inhale 2 puffs into the lungs every 4 (four) hours as needed for wheezing. 02/13/13  Yes Jennifer L Piepenbrink, PA-C  ALPRAZolam (XANAX) 0.5 MG tablet Take 0.5 mg by mouth daily as needed for anxiety.   Yes Historical Provider, MD  amitriptyline (ELAVIL) 50 MG tablet Take 1 tablet (50 mg total) by mouth at bedtime. 04/15/14  Yes Angelica Chessman, MD  amLODipine (NORVASC) 10 MG tablet Take 1 tablet (10 mg total) by mouth daily. 03/11/14  Yes Angelica Chessman, MD  amoxicillin-clavulanate (AUGMENTIN) 875-125 MG per tablet Take 1 tablet by mouth 2 (two) times daily. 10 day therapy course patient is on day 6 patient to complete on 05/19/2014 05/09/14 05/19/14 Yes Historical Provider, MD  famotidine (PEPCID) 20 MG tablet Take 1 tablet (20 mg total) by mouth 2 (two) times daily. 05/06/14  Yes Angelica Chessman, MD  gabapentin (NEURONTIN) 300 MG capsule Take 1 capsule (300 mg total) by mouth 3 (three) times daily. 03/11/14  Yes Angelica Chessman, MD  gentamicin (GARAMYCIN) 0.3 % ophthalmic solution Place 2 drops into the right eye 3 (three) times daily. 05/06/14  Yes Angelica Chessman, MD  glipiZIDE (GLUCOTROL) 10 MG tablet Take 1 tablet (10 mg total) by mouth 2 (two) times daily before a meal. 03/11/14  Yes Angelica Chessman, MD  hydrALAZINE (APRESOLINE) 25 MG  tablet Take 1 tablet (25 mg total) by mouth 3 (three) times daily. 03/11/14  Yes Angelica Chessman, MD  insulin NPH-regular Human (HUMULIN 70/30) (70-30) 100 UNIT/ML injection Inject 20 Units into the skin 2 (two) times daily with a meal. 03/11/14  Yes Angelica Chessman, MD  metFORMIN (GLUCOPHAGE) 1000 MG tablet Take 1 tablet (1,000 mg total) by mouth 2 (two) times daily. 03/11/14  Yes Angelica Chessman, MD  glucose monitoring kit (FREESTYLE) monitoring kit 1 each by Does not apply route 4 (four) times daily - after meals and at bedtime. 1 month Diabetic  Testing Supplies for QAC-QHS accuchecks. 06/11/13   Thurnell Lose, MD   Triage Vitals: BP 186/96  Pulse 119  Temp(Src) 100 F (37.8 C) (Oral)  Resp 18  SpO2 100%  LMP 04/26/2014 Physical Exam  Nursing note and vitals reviewed. Constitutional: She is oriented to person, place, and time. She appears well-developed and well-nourished. No distress.  Nontoxic/nonseptic appearing  HENT:  Head: Normocephalic and atraumatic.  Eyes: Conjunctivae and EOM are normal. No scleral icterus.  Neck:  Patient with area of fluctuance to posterior neck, just left of midline. Area of fluctuance 2cm in diameter. Findings c/w abscess. There is an area of induration with mild fluctuance which extends superiorly, measuring approximately 2.5cm x 1.5cm which is likely representative of abscess extension through soft tissue. Area is tender upon palpation. Mild associated erythema and heat to touch. No red linear streaking. Limited ROM of cervical spine secondary to pain.  Pulmonary/Chest: Effort normal. No respiratory distress.  Musculoskeletal: Normal range of motion.  Neurological: She is alert and oriented to person, place, and time. She exhibits normal muscle tone. Coordination normal.  GCS 15. Speech is goal oriented. Normal shoulder shrug against resistance. Equal grip strength bilaterally with 5/5 strength against resistance in bilateral upper extremities. No gross sensory deficits appreciated.  Skin: Skin is warm and dry. No rash noted. She is not diaphoretic. No erythema. No pallor.  Psychiatric: She has a normal mood and affect. Her behavior is normal.    ED Course  Procedures (including critical care time) DIAGNOSTIC STUDIES: Oxygen Saturation is 100% on RA, normal by my interpretation.    COORDINATION OF CARE: 11:11 PM- Patient informed of current plan for treatment and evaluation and agrees with plan at this time.  INCISION AND DRAINAGE PROCEDURE NOTE: Patient identification was confirmed and  verbal consent was obtained. This procedure was performed by Antonietta Breach, PA-C at 11:38 PM. Site: Posterior neck Needle size: 21 gauge Anesthetic used (type and amt): Lidocaine 2% with EPI Blade size: 15 Drainage: Copious Complexity: Complex Packing used, 1/2 inch  Site anesthetized, incision made over site, wound drained and explored loculations, rinsed with copious amounts of normal saline, wound packed with sterile gauze, covered with dry, sterile dressing.  Pt tolerated procedure well without complications. Instructions for care discussed verbally and pt provided with additional written instructions for homecare and f/u.  MDM   Final diagnoses:  Abscess of skin of neck    Patient with skin abscess amenable to incision and drainage.  Abscess packed with iodoform gauze to ensure the area stayed open for persistent drainage; wound recheck and packing removal advised in 2 days. Encouraged home warm compresses and soaks. Mild signs of cellulitis is surrounding skin. Given location and hx of hidradenitis, will supplement with Bactrim and Keflex. Norco prescribed for pain control. Return precautions discussed and provided. Patient agreeable to plan and stable for d/c with no unaddressed concerns.  I personally  performed the services described in this documentation, which was scribed in my presence. The recorded information has been reviewed and is accurate.     Antonietta Breach, PA-C 05/20/14 1942

## 2014-05-15 NOTE — ED Notes (Signed)
Pt reports having an abscess to the back of her neck. Pt reports recently being seen at her PCP last Thursday for the same and was told to put warm compresses on the area and was prescribed amoxicillin. Pt states the area has gotten bigger since using the heating pad. Pt is A/O x4 and in NAD.

## 2014-05-16 MED ORDER — HYDROCODONE-ACETAMINOPHEN 5-325 MG PO TABS: 1.0000 | ORAL_TABLET | ORAL | Status: DC | PRN

## 2014-05-16 MED ORDER — CEPHALEXIN 500 MG PO CAPS: 500.0000 mg | ORAL_CAPSULE | Freq: Four times a day (QID) | ORAL | Status: DC

## 2014-05-16 MED ORDER — SULFAMETHOXAZOLE-TRIMETHOPRIM 800-160 MG PO TABS: 1.0000 | ORAL_TABLET | Freq: Two times a day (BID) | ORAL | Status: AC

## 2014-05-16 NOTE — Discharge Instructions (Signed)
Followup for packing removal and wound recheck in 48 hours. Followup with your primary care provider or return to Emergency Department if you are not able to see your primary doctor. Recommend you discontinue amoxicillin and take Bactrim and Keflex as prescribed. Take Norco as needed for pain control. Return sooner if symptoms worsen.  Abscess Care After An abscess (also called a boil or furuncle) is an infected area that contains a collection of pus. Signs and symptoms of an abscess include pain, tenderness, redness, or hardness, or you may feel a moveable soft area under your skin. An abscess can occur anywhere in the body. The infection may spread to surrounding tissues causing cellulitis. A cut (incision) by the surgeon was made over your abscess and the pus was drained out. Gauze may have been packed into the space to provide a drain that will allow the cavity to heal from the inside outwards. The boil may be painful for 5 to 7 days. Most people with a boil do not have high fevers. Your abscess, if seen early, may not have localized, and may not have been lanced. If not, another appointment may be required for this if it does not get better on its own or with medications. HOME CARE INSTRUCTIONS   Only take over-the-counter or prescription medicines for pain, discomfort, or fever as directed by your caregiver.  When you bathe, soak and then remove gauze or iodoform packs at least daily or as directed by your caregiver. You may then wash the wound gently with mild soapy water. Repack with gauze or do as your caregiver directs. SEEK IMMEDIATE MEDICAL CARE IF:   You develop increased pain, swelling, redness, drainage, or bleeding in the wound site.  You develop signs of generalized infection including muscle aches, chills, fever, or a general ill feeling.  An oral temperature above 102 F (38.9 C) develops, not controlled by medication. See your caregiver for a recheck if you develop any of the  symptoms described above. If medications (antibiotics) were prescribed, take them as directed. Document Released: 04/08/2005 Document Revised: 12/13/2011 Document Reviewed: 12/04/2007 [facility] Patient Information 2015 [place], [place]. This information is not intended to replace advice given to you by your health care provider. Make sure you discuss any questions you have with your health care provider.

## 2014-05-17 ENCOUNTER — Ambulatory Visit: Payer: BC Managed Care – PPO | Attending: Internal Medicine

## 2014-05-17 DIAGNOSIS — L0212 Furuncle of neck: Secondary | ICD-10-CM

## 2014-05-17 NOTE — Progress Notes (Unsigned)
Patient here for wound packing removal. Patient was seen in ED on 05/15/2013 for boil on the upper back and prescribed additional antibiotics. Patients states she is still having vaginal itching and has just picked up some Diflucan from the pharmacy. Informed patient she can also by some OTC Monistat cream to put on the outer vaginal area. Patient verbalized understanding. Top dressing removed and packing removed. Placed sterile Gauze over the wound. Informed patient to follow the home care abscess information she was given in the ED. Patient verbalized understanding. Vivia Birmingham, RN

## 2014-05-21 NOTE — ED Provider Notes (Signed)
Medical screening examination/treatment/procedure(s) were performed by non-physician practitioner and as supervising physician I was immediately available for consultation/collaboration.    Ernestina Patches, MD 05/21/14 1500

## 2014-06-04 ENCOUNTER — Telehealth: Payer: Self-pay | Admitting: Emergency Medicine

## 2014-06-04 ENCOUNTER — Telehealth: Payer: Self-pay | Admitting: Internal Medicine

## 2014-06-04 NOTE — Telephone Encounter (Signed)
Left message for pt to schedule ov regarding boil.

## 2014-06-04 NOTE — Telephone Encounter (Signed)
Pt. Came in to schedule an appt., informed pt. We don't have an appt. With her PCP available at the moment....pt. Is expressing that boil on neck has gotten any better...Marland KitchenMarland KitchenPlease call patient and advice as to what can she do in regards to this issue.

## 2014-06-13 ENCOUNTER — Ambulatory Visit: Payer: BC Managed Care – PPO | Attending: Internal Medicine | Admitting: Internal Medicine

## 2014-06-13 ENCOUNTER — Encounter: Payer: Self-pay | Admitting: Internal Medicine

## 2014-06-13 VITALS — BP 133/80 | HR 107 | Temp 98.9°F | Resp 28 | Ht 66.0 in | Wt 228.2 lb

## 2014-06-13 DIAGNOSIS — E1310 Other specified diabetes mellitus with ketoacidosis without coma: Secondary | ICD-10-CM

## 2014-06-13 DIAGNOSIS — Z1239 Encounter for other screening for malignant neoplasm of breast: Secondary | ICD-10-CM | POA: Insufficient documentation

## 2014-06-13 DIAGNOSIS — E1165 Type 2 diabetes mellitus with hyperglycemia: Secondary | ICD-10-CM

## 2014-06-13 DIAGNOSIS — E119 Type 2 diabetes mellitus without complications: Secondary | ICD-10-CM

## 2014-06-13 DIAGNOSIS — E1110 Type 2 diabetes mellitus with ketoacidosis without coma: Secondary | ICD-10-CM

## 2014-06-13 LAB — GLUCOSE, POCT (MANUAL RESULT ENTRY): POC Glucose: 175 mg/dl — AB (ref 70–99)

## 2014-06-13 MED ORDER — "INSULIN SYRINGE-NEEDLE U-100 25G X 5/8"" 1 ML MISC": Status: DC

## 2014-06-13 MED ORDER — GLIPIZIDE 10 MG PO TABS: 10.0000 mg | ORAL_TABLET | Freq: Two times a day (BID) | ORAL | Status: DC

## 2014-06-13 MED ORDER — METFORMIN HCL 1000 MG PO TABS: 1000.0000 mg | ORAL_TABLET | Freq: Two times a day (BID) | ORAL | Status: DC

## 2014-06-13 NOTE — Telephone Encounter (Signed)
Seen by Dr. Doreene Burke on 9/10

## 2014-06-13 NOTE — Progress Notes (Signed)
Patient presents for f/u on recurrent boil on nape. Causes neck stiffness. Rates 9/10 at present Needs refills on DM supplies Wants to discuss insulin changes Wants to quit smoking Declined flu vaccine

## 2014-06-13 NOTE — Progress Notes (Signed)
Patient ID: Caitlin Fritz, female   DOB: December 31, 1970, 43 y.o.   MRN: [id number]   Caitlin Fritz, is a 42 y.o. female  [id number]  [id number]  DOB - 07-13-1971  Chief Complaint  Patient presents with  . Recurrent Skin Infections    nape        Subjective:   Caitlin Fritz is a 43 y.o. female here today for a follow up visit. Patient has history of hypertension, diabetes mellitus and asthma, complaining of recurrent boil around the nape causing neck stiffness and pain. She has been to urgent care and has been treated with antibiotics, boil is healing well. She needs refills of her diabetic supplies. She also wants to quit smoking requesting nicotine patches. She declines flu vaccine today. She is requesting mammogram. Patient has No headache, No chest pain, No abdominal pain - No Nausea, No new weakness tingling or numbness, No Cough - SOB.  Problem  Breast Cancer Screening    ALLERGIES: Allergies  Allergen Reactions  . Clonidine Derivatives Other (See Comments)    Seizures   . Prednisone Other (See Comments)    Eye drops only-temporary blindness     PAST MEDICAL HISTORY: Past Medical History  Diagnosis Date  . Hypertension   . Diabetes mellitus without complication   . Asthma     MEDICATIONS AT HOME: Prior to Admission medications   Medication Sig Start Date End Date Taking? Authorizing Provider  albuterol (PROVENTIL HFA;VENTOLIN HFA) 108 (90 Fritz) MCG/ACT inhaler Inhale 2 puffs into the lungs every 4 (four) hours as needed for wheezing. 02/13/13  Yes Jennifer L Piepenbrink, PA-C  ALPRAZolam (XANAX) 0.5 MG tablet Take 0.5 mg by mouth daily as needed for anxiety.   Yes Historical Provider, MD  amitriptyline (ELAVIL) 50 MG tablet Take 1 tablet (50 mg total) by mouth at bedtime. 04/15/14  Yes Tresa Garter, MD  amLODipine (NORVASC) 10 MG tablet Take 1 tablet (10 mg total) by mouth daily. 03/11/14  Yes Tresa Garter, MD  famotidine (PEPCID) 20 MG  tablet Take 1 tablet (20 mg total) by mouth 2 (two) times daily. 05/06/14  Yes Tresa Garter, MD  gabapentin (NEURONTIN) 300 MG capsule Take 1 capsule (300 mg total) by mouth 3 (three) times daily. 03/11/14  Yes Tresa Garter, MD  gentamicin (GARAMYCIN) 0.3 % ophthalmic solution Place 2 drops into the right eye 3 (three) times daily. 05/06/14  Yes Dominiqua Cooner Essie Christine, MD  glipiZIDE (GLUCOTROL) 10 MG tablet Take 1 tablet (10 mg total) by mouth 2 (two) times daily before a meal. 06/13/14  Yes Deneisha Dade E Doreene Burke, MD  glucose monitoring kit (FREESTYLE) monitoring kit 1 each by Does not apply route 4 (four) times daily - after meals and at bedtime. 1 month Diabetic Testing Supplies for QAC-QHS accuchecks. 06/11/13  Yes Thurnell Lose, MD  hydrALAZINE (APRESOLINE) 25 MG tablet Take 1 tablet (25 mg total) by mouth 3 (three) times daily. 03/11/14  Yes Jarious Lyon Essie Christine, MD  cephALEXin (KEFLEX) 500 MG capsule Take 1 capsule (500 mg total) by mouth 4 (four) times daily. 05/16/14   Antonietta Breach, PA-C  HYDROcodone-acetaminophen (NORCO/VICODIN) 5-325 MG per tablet Take 1 tablet by mouth every 4 (four) hours as needed for moderate pain or severe pain. 05/16/14   Antonietta Breach, PA-C  insulin NPH-regular Human (HUMULIN 70/30) (70-30) 100 UNIT/ML injection Inject 20 Units into the skin 2 (two) times daily with a meal. 03/11/14   Tresa Garter, MD  Insulin Syringe-Needle U-100 25G  X 5/8" 1 ML MISC Test BG 3 times daily 06/13/14   Tresa Garter, MD  metFORMIN (GLUCOPHAGE) 1000 MG tablet Take 1 tablet (1,000 mg total) by mouth 2 (two) times daily. 06/13/14   Tresa Garter, MD     Objective:   Filed Vitals:   06/13/14 1504  BP: 133/80  Pulse: 107  Temp: 98.9 F (37.2 C)  TempSrc: Oral  Resp: 28  Height: 5' 6"  (1.676 m)  Weight: 228 lb 3.2 oz (103.511 kg)  SpO2: 99%    Exam General appearance : Awake, alert, not in any distress. Speech Clear. Not toxic looking, obese HEENT: Atraumatic and  Normocephalic, pupils equally reactive to light and accomodation Neck: Healing furuncle at the nape of neck supple, no JVD. No cervical lymphadenopathy.  Chest:Good air entry bilaterally, no added sounds  CVS: S1 S2 regular, no murmurs.  Abdomen: Bowel sounds present, Non tender and not distended with no gaurding, rigidity or rebound. Extremities: B/L Lower Ext shows no edema, both legs are warm to touch Neurology: Awake alert, and oriented X 3, CN II-XII intact, Non focal  Data Review Lab Results  Component Value Date   HGBA1C 8.0 03/11/2014   HGBA1C 8.3 01/07/2014   HGBA1C 10.5* 11/03/2013     Assessment & Plan   1. Type 2 diabetes mellitus with hyperglycemia  - Glucose (CBG)  2. DM (diabetes mellitus) type 2  - glipiZIDE (GLUCOTROL) 10 MG tablet; Take 1 tablet (10 mg total) by mouth 2 (two) times daily before a meal.  Dispense: 180 tablet; Refill: 3 - metFORMIN (GLUCOPHAGE) 1000 MG tablet; Take 1 tablet (1,000 mg total) by mouth 2 (two) times daily.  Dispense: 180 tablet; Refill: 3 - Insulin Syringe-Needle U-100 25G X 5/8" 1 ML MISC; Test BG 3 times daily  Dispense: 500 each; Refill: 3   Aim for 2-3 Carb Choices per meal (30-45 grams) +/- 1 either way  Aim for 0-15 Carbs per snack if hungry  Include protein in moderation with your meals and snacks  Consider reading food labels for Total Carbohydrate and Fat Grams of foods  Consider checking BG at alternate times per day  Continue taking medication as directed Fruit Punch - find one with no sugar  Measure and decrease portions of carbohydrate foods  Make your plate and don't go back for seconds   3. Breast cancer screening  - MM Digital Screening; Future  The patient was counseled on the dangers of tobacco use, and was advised to quit. Reviewed strategies to maximize success, including removing cigarettes and smoking materials from environment, stress management and support of family/friends.   Return in about 2 months  (around 08/13/2014), or if symptoms worsen or fail to improve, for Hemoglobin A1C and Follow up, DM, Follow up HTN.  The patient was given clear instructions to go to ER or return to medical center if symptoms don't improve, worsen or new problems develop. The patient verbalized understanding. The patient was told to call to get lab results if they haven't heard anything in the next week.   This note has been created with Surveyor, quantity. Any transcriptional errors are unintentional.    Angelica Chessman, MD, [place], [place], [place] and [place], [place]   06/13/2014, 3:54 PM

## 2014-06-13 NOTE — Patient Instructions (Signed)
Diabetes and Exercise Exercising regularly is important. It is not just about losing weight. It has many health benefits, such as:  Improving your overall fitness, flexibility, and endurance.  Increasing your bone density.  Helping with weight control.  Decreasing your body fat.  Increasing your muscle strength.  Reducing stress and tension.  Improving your overall health. People with diabetes who exercise gain additional benefits because exercise:  Reduces appetite.  Improves the body's use of blood sugar (glucose).  Helps lower or control blood glucose.  Decreases blood pressure.  Helps control blood lipids (such as cholesterol and triglycerides).  Improves the body's use of the hormone insulin by:  Increasing the body's insulin sensitivity.  Reducing the body's insulin needs.  Decreases the risk for heart disease because exercising:  Lowers cholesterol and triglycerides levels.  Increases the levels of good cholesterol (such as high-density lipoproteins [HDL]) in the body.  Lowers blood glucose levels. YOUR ACTIVITY PLAN  Choose an activity that you enjoy and set realistic goals. Your health care provider or diabetes educator can help you make an activity plan that works for you. Exercise regularly as directed by your health care provider. This includes:  Performing resistance training twice a week such as push-ups, sit-ups, lifting weights, or using resistance bands.  Performing 150 minutes of cardio exercises each week such as walking, running, or playing sports.  Staying active and spending no more than 90 minutes at one time being inactive. Even short bursts of exercise are good for you. Three 10-minute sessions spread throughout the day are just as beneficial as a single 30-minute session. Some exercise ideas include:  Taking the dog for a walk.  Taking the stairs instead of the elevator.  Dancing to your favorite song.  Doing an exercise  video.  Doing your favorite exercise with a friend. RECOMMENDATIONS FOR EXERCISING WITH TYPE 1 OR TYPE 2 DIABETES   Check your blood glucose before exercising. If blood glucose levels are greater than 240 mg/dL, check for urine ketones. Do not exercise if ketones are present.  Avoid injecting insulin into areas of the body that are going to be exercised. For example, avoid injecting insulin into:  The arms when playing tennis.  The legs when jogging.  Keep a record of:  Food intake before and after you exercise.  Expected peak times of insulin action.  Blood glucose levels before and after you exercise.  The type and amount of exercise you have done.  Review your records with your health care provider. Your health care provider will help you to develop guidelines for adjusting food intake and insulin amounts before and after exercising.  If you take insulin or oral hypoglycemic agents, watch for signs and symptoms of hypoglycemia. They include:  Dizziness.  Shaking.  Sweating.  Chills.  Confusion.  Drink plenty of water while you exercise to prevent dehydration or heat stroke. Body water is lost during exercise and must be replaced.  Talk to your health care provider before starting an exercise program to make sure it is safe for you. Remember, almost any type of activity is better than none. Document Released: 12/11/2003 Document Revised: 02/04/2014 Document Reviewed: 02/27/2013 [facility] Patient Information 2015 [place], [place]. This information is not intended to replace advice given to you by your health care provider. Make sure you discuss any questions you have with your health care provider. Diabetes Mellitus and Food It is important for you to manage your blood sugar (glucose) level. Your blood glucose level can  be greatly affected by what you eat. Eating healthier foods in the appropriate amounts throughout the day at about the same time each day will help you  control your blood glucose level. It can also help slow or prevent worsening of your diabetes mellitus. Healthy eating may even help you improve the level of your blood pressure and reach or maintain a healthy weight.  HOW CAN FOOD AFFECT ME? Carbohydrates Carbohydrates affect your blood glucose level more than any other type of food. Your dietitian will help you determine how many carbohydrates to eat at each meal and teach you how to count carbohydrates. Counting carbohydrates is important to keep your blood glucose at a healthy level, especially if you are using insulin or taking certain medicines for diabetes mellitus. Alcohol Alcohol can cause sudden decreases in blood glucose (hypoglycemia), especially if you use insulin or take certain medicines for diabetes mellitus. Hypoglycemia can be a life-threatening condition. Symptoms of hypoglycemia (sleepiness, dizziness, and disorientation) are similar to symptoms of having too much alcohol.  If your health care provider has given you approval to drink alcohol, do so in moderation and use the following guidelines:  Women should not have more than one drink per day, and men should not have more than two drinks per day. One drink is equal to:  12 oz of beer.  5 oz of wine.  1 oz of hard liquor.  Do not drink on an empty stomach.  Keep yourself hydrated. Have water, diet soda, or unsweetened iced tea.  Regular soda, juice, and other mixers might contain a lot of carbohydrates and should be counted. WHAT FOODS ARE NOT RECOMMENDED? As you make food choices, it is important to remember that all foods are not the same. Some foods have fewer nutrients per serving than other foods, even though they might have the same number of calories or carbohydrates. It is difficult to get your body what it needs when you eat foods with fewer nutrients. Examples of foods that you should avoid that are high in calories and carbohydrates but low in nutrients  include:  Trans fats (most processed foods list trans fats on the Nutrition Facts label).  Regular soda.  Juice.  Candy.  Sweets, such as cake, pie, doughnuts, and cookies.  Fried foods. WHAT FOODS CAN I EAT? Have nutrient-rich foods, which will nourish your body and keep you healthy. The food you should eat also will depend on several factors, including:  The calories you need.  The medicines you take.  Your weight.  Your blood glucose level.  Your blood pressure level.  Your cholesterol level. You also should eat a variety of foods, including:  Protein, such as meat, poultry, fish, tofu, nuts, and seeds (lean animal proteins are best).  Fruits.  Vegetables.  Dairy products, such as milk, cheese, and yogurt (low fat is best).  Breads, grains, pasta, cereal, rice, and beans.  Fats such as olive oil, trans fat-free margarine, canola oil, avocado, and olives. DOES EVERYONE WITH DIABETES MELLITUS HAVE THE SAME MEAL PLAN? Because every person with diabetes mellitus is different, there is not one meal plan that works for everyone. It is very important that you meet with a dietitian who will help you create a meal plan that is just right for you. Document Released: 06/17/2005 Document Revised: 09/25/2013 Document Reviewed: 08/17/2013 [facility] Patient Information 2015 [place], [place]. This information is not intended to replace advice given to you by your health care provider. Make sure you discuss any  questions you have with your health care provider.  

## 2014-07-03 ENCOUNTER — Other Ambulatory Visit: Payer: Self-pay | Admitting: Internal Medicine

## 2014-07-03 ENCOUNTER — Ambulatory Visit (HOSPITAL_COMMUNITY)
Admission: RE | Admit: 2014-07-03 | Discharge: 2014-07-03 | Disposition: A | Discharge status: Home / Self Care | Payer: BC Managed Care – PPO | Source: Ambulatory Visit | Attending: Internal Medicine | Admitting: Internal Medicine

## 2014-07-03 DIAGNOSIS — Z1239 Encounter for other screening for malignant neoplasm of breast: Secondary | ICD-10-CM

## 2014-07-03 DIAGNOSIS — Z1231 Encounter for screening mammogram for malignant neoplasm of breast: Secondary | ICD-10-CM

## 2014-07-08 ENCOUNTER — Ambulatory Visit: Payer: Self-pay | Attending: Internal Medicine | Admitting: Internal Medicine

## 2014-07-08 ENCOUNTER — Encounter: Payer: Self-pay | Admitting: Internal Medicine

## 2014-07-08 VITALS — BP 130/85 | HR 91 | Temp 98.3°F | Resp 16 | Ht 65.0 in | Wt 244.0 lb

## 2014-07-08 DIAGNOSIS — L732 Hidradenitis suppurativa: Secondary | ICD-10-CM | POA: Insufficient documentation

## 2014-07-08 DIAGNOSIS — F4323 Adjustment disorder with mixed anxiety and depressed mood: Secondary | ICD-10-CM | POA: Insufficient documentation

## 2014-07-08 DIAGNOSIS — E1140 Type 2 diabetes mellitus with diabetic neuropathy, unspecified: Secondary | ICD-10-CM

## 2014-07-08 DIAGNOSIS — E1142 Type 2 diabetes mellitus with diabetic polyneuropathy: Secondary | ICD-10-CM | POA: Insufficient documentation

## 2014-07-08 DIAGNOSIS — E1110 Type 2 diabetes mellitus with ketoacidosis without coma: Secondary | ICD-10-CM

## 2014-07-08 DIAGNOSIS — I10 Essential (primary) hypertension: Secondary | ICD-10-CM | POA: Insufficient documentation

## 2014-07-08 DIAGNOSIS — Z888 Allergy status to other drugs, medicaments and biological substances status: Secondary | ICD-10-CM | POA: Insufficient documentation

## 2014-07-08 DIAGNOSIS — Z79899 Other long term (current) drug therapy: Secondary | ICD-10-CM | POA: Insufficient documentation

## 2014-07-08 DIAGNOSIS — Z794 Long term (current) use of insulin: Secondary | ICD-10-CM | POA: Insufficient documentation

## 2014-07-08 DIAGNOSIS — F17200 Nicotine dependence, unspecified, uncomplicated: Secondary | ICD-10-CM

## 2014-07-08 DIAGNOSIS — E1310 Other specified diabetes mellitus with ketoacidosis without coma: Secondary | ICD-10-CM

## 2014-07-08 DIAGNOSIS — F17210 Nicotine dependence, cigarettes, uncomplicated: Secondary | ICD-10-CM | POA: Insufficient documentation

## 2014-07-08 DIAGNOSIS — E119 Type 2 diabetes mellitus without complications: Secondary | ICD-10-CM

## 2014-07-08 DIAGNOSIS — J45909 Unspecified asthma, uncomplicated: Secondary | ICD-10-CM | POA: Insufficient documentation

## 2014-07-08 LAB — GLUCOSE, POCT (MANUAL RESULT ENTRY)
POC GLUCOSE: 375 mg/dL — AB (ref 70–99)
POC Glucose: 71 mg/dl (ref 70–99)

## 2014-07-08 LAB — POCT GLYCOSYLATED HEMOGLOBIN (HGB A1C): HEMOGLOBIN A1C: 8

## 2014-07-08 MED ORDER — AMITRIPTYLINE HCL 100 MG PO TABS: 100.0000 mg | ORAL_TABLET | Freq: Every day | ORAL | Status: DC

## 2014-07-08 MED ORDER — NICOTINE 14 MG/24HR TD PT24: 14.0000 mg | MEDICATED_PATCH | Freq: Every day | TRANSDERMAL | Status: DC

## 2014-07-08 MED ORDER — GABAPENTIN 400 MG PO CAPS: 400.0000 mg | ORAL_CAPSULE | Freq: Three times a day (TID) | ORAL | Status: DC

## 2014-07-08 NOTE — Progress Notes (Signed)
Pt is here following up on her HTN and diabetes. Pt states that the numbness and tingling in her extremities has gotten worse. She claims that she is still having pain at the sites of her cysts, neck/and right side.

## 2014-07-08 NOTE — Progress Notes (Signed)
Patient ID: Caitlin Fritz, female   DOB: 12/23/1970, 43 y.o.   MRN: [id number]   Wednesday Ericsson, is a 43 y.o. female  [id number]  [id number]  DOB - [birth year]  Chief Complaint  Patient presents with  . Follow-up        Subjective:   Caitlin Fritz is a 43 y.o. female here today for a follow up visit. Patient has history of hypertension, diabetes mellitus, and asthma. She is here today for her routine followup, and hemoglobin A1c checked. She claims compliant with medications, she reports her blood sugar be within control, and she is trying with her dietary control as well as some exercise as tolerated. She does not have any side effects from medications. Her blood sugar range at home is between 90 and 150 per patient. She has recurrent hidradenitis and has been on several antibiotics in the past. She had a mammogram recently that was normal. Her Pap smear was negative for malignancy. She continues to smoke cigarette but now down to half a pack per day, and she is trying to quit come [birth year] at [birth year] birthday. Patient has No headache, No chest pain, No abdominal pain - No Nausea, No new weakness tingling or numbness, No Cough - SOB.  No problems updated.  ALLERGIES: Allergies  Allergen Reactions  . Clonidine Derivatives Other (See Comments)    Seizures   . Prednisone Other (See Comments)    Eye drops only-temporary blindness     PAST MEDICAL HISTORY: Past Medical History  Diagnosis Date  . Hypertension   . Diabetes mellitus without complication   . Asthma     MEDICATIONS AT HOME: Prior to Admission medications   Medication Sig Start Date End Date Taking? Authorizing Provider  albuterol (PROVENTIL HFA;VENTOLIN HFA) 108 (90 BASE) MCG/ACT inhaler Inhale 2 puffs into the lungs every 4 (four) hours as needed for wheezing. 02/13/13  Yes Jennifer L Piepenbrink, PA-C  ALPRAZolam (XANAX) 0.5 MG tablet Take 0.5 mg by mouth daily as needed for anxiety.   Yes Historical  Provider, MD  amitriptyline (ELAVIL) 100 MG tablet Take 1 tablet (100 mg total) by mouth at bedtime. 07/08/14  Yes Tresa Garter, MD  amLODipine (NORVASC) 10 MG tablet Take 1 tablet (10 mg total) by mouth daily. 03/11/14  Yes Tresa Garter, MD  gabapentin (NEURONTIN) 400 MG capsule Take 1 capsule (400 mg total) by mouth 3 (three) times daily. 07/08/14  Yes Tresa Garter, MD  gentamicin (GARAMYCIN) 0.3 % ophthalmic solution Place 2 drops into the right eye 3 (three) times daily. 05/06/14  Yes Dejanique Ruehl Essie Christine, MD  glipiZIDE (GLUCOTROL) 10 MG tablet Take 1 tablet (10 mg total) by mouth 2 (two) times daily before a meal. 06/13/14  Yes Hamad Whyte E Doreene Burke, MD  glucose monitoring kit (FREESTYLE) monitoring kit 1 each by Does not apply route 4 (four) times daily - after meals and at bedtime. 1 month Diabetic Testing Supplies for QAC-QHS accuchecks. 06/11/13  Yes Thurnell Lose, MD  hydrALAZINE (APRESOLINE) 25 MG tablet Take 1 tablet (25 mg total) by mouth 3 (three) times daily. 03/11/14  Yes Tresa Garter, MD  insulin NPH-regular Human (HUMULIN 70/30) (70-30) 100 UNIT/ML injection Inject 20 Units into the skin 2 (two) times daily with a meal. 03/11/14  Yes Tresa Garter, MD  Insulin Syringe-Needle U-100 25G X 5/8" 1 ML MISC Test BG 3 times daily 06/13/14  Yes Deklyn Gibbon E Doreene Burke, MD  metFORMIN (GLUCOPHAGE) 1000 MG tablet Take 1 tablet (  1,000 mg total) by mouth 2 (two) times daily. 06/13/14  Yes Kalik Hoare Essie Christine, MD  cephALEXin (KEFLEX) 500 MG capsule Take 1 capsule (500 mg total) by mouth 4 (four) times daily. 05/16/14   Antonietta Breach, PA-C  famotidine (PEPCID) 20 MG tablet Take 1 tablet (20 mg total) by mouth 2 (two) times daily. 05/06/14   Tresa Garter, MD  HYDROcodone-acetaminophen (NORCO/VICODIN) 5-325 MG per tablet Take 1 tablet by mouth every 4 (four) hours as needed for moderate pain or severe pain. 05/16/14   Antonietta Breach, PA-C  nicotine (NICODERM CQ - DOSED IN MG/24 HOURS) 14  mg/24hr patch Place 1 patch (14 mg total) onto the skin daily. 07/08/14   Tresa Garter, MD     Objective:   Filed Vitals:   07/08/14 1135  BP: 130/85  Pulse: 91  Temp: 98.3 F (36.8 C)  TempSrc: Oral  Resp: 16  Height: 5' 5"  (1.651 m)  Weight: 244 lb (110.678 kg)  SpO2: 96%    Exam General appearance : Awake, alert, not in any distress. Speech Clear. Not toxic looking, obese HEENT: Atraumatic and Normocephalic, pupils equally reactive to light and accomodation Neck: supple, no JVD. No cervical lymphadenopathy.  Chest:Good air entry bilaterally, no added sounds  CVS: S1 S2 regular, no murmurs.  Abdomen: Bowel sounds present, Non tender and not distended with no gaurding, rigidity or rebound. Extremities: B/L Lower Ext shows no edema, both legs are warm to touch Neurology: Awake alert, and oriented X 3, CN II-XII intact, Non focal Skin:No Rash Wounds:N/A  Data Review Lab Results  Component Value Date   HGBA1C 8.0 07/08/2014   HGBA1C 8.0 03/11/2014   HGBA1C 8.3 01/07/2014     Assessment & Plan   1. Type 2 diabetes mellitus  - Glucose (CBG) - HgB A1c is 8.0% today, no change from previous result Diabetic diet was emphasized - Lipid panel - Microalbumin/Creatinine Ratio, Urine - Microalbumin, urine - COMPLETE METABOLIC PANEL WITH GFR  2. Essential hypertension Blood pressure is at goal Continue current regimen DASH diet  3. Adjustment disorder with mixed anxiety and depressed mood The patient was extensively counseled She was given the option of seeing a mental counselor in the clinic, but she will see him another day  4. Diabetic neuropathy, painful  Increase amitriptyline 100 mg tablet by mouth each bedtime  - amitriptyline (ELAVIL) 100 MG tablet; Take 1 tablet (100 mg total) by mouth at bedtime.  Dispense: 30 tablet; Refill: 3  5. 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  Dispense: 270 capsule; Refill: 3  6. Smoking addiction Patient is ready to quit, she has a quit date of 08/03/2014 which coincides with her birthday. Patient has been given support, I will give her the potential replacement patch to increase her success chances. - nicotine (NICODERM CQ - DOSED IN MG/24 HOURS) 14 mg/24hr patch; Place 1 patch (14 mg total) onto the skin daily.  Dispense: 28 patch; Refill: 0   Patient was extensively counseled on nutrition and exercise    Return in about 3 months (around 10/08/2014), or if symptoms worsen or fail to improve, for Hemoglobin A1C and Follow up, DM, Follow up HTN.  The patient was given clear instructions to go to ER or return to medical center if symptoms  don't improve, worsen or new problems develop. The patient verbalized understanding. The patient was told to call to get lab results if they haven't heard anything in the next week.   This note has been created with Surveyor, quantity. Any transcriptional errors are unintentional.    Angelica Chessman, MD, [place], [place], [place] and [place], [place]   07/08/2014, 12:09 PM

## 2014-07-08 NOTE — Patient Instructions (Signed)
DASH Eating Plan DASH stands for "Dietary Approaches to Stop Hypertension." The DASH eating plan is a healthy eating plan that has been shown to reduce high blood pressure (hypertension). Additional health benefits may include reducing the risk of type 2 diabetes mellitus, heart disease, and stroke. The DASH eating plan may also help with weight loss. WHAT DO I NEED TO KNOW ABOUT THE DASH EATING PLAN? For the DASH eating plan, you will follow these general guidelines:  Choose foods with a percent daily value for sodium of less than 5% (as listed on the food label).  Use salt-free seasonings or herbs instead of table salt or sea salt.  Check with your health care provider or pharmacist before using salt substitutes.  Eat lower-sodium products, often labeled as "lower sodium" or "no salt added."  Eat fresh foods.  Eat more vegetables, fruits, and low-fat dairy products.  Choose whole grains. Look for the word "whole" as the first word in the ingredient list.  Choose fish and skinless chicken or turkey more often than red meat. Limit fish, poultry, and meat to 6 oz (170 g) each day.  Limit sweets, desserts, sugars, and sugary drinks.  Choose heart-healthy fats.  Limit cheese to 1 oz (28 g) per day.  Eat more home-cooked food and less restaurant, buffet, and fast food.  Limit fried foods.  Cook foods using methods other than frying.  Limit canned vegetables. If you do use them, rinse them well to decrease the sodium.  When eating at a restaurant, ask that your food be prepared with less salt, or no salt if possible. WHAT FOODS CAN I EAT? Seek help from a dietitian for individual calorie needs. Grains Whole grain or whole wheat bread. Brown rice. Whole grain or whole wheat pasta. Quinoa, bulgur, and whole grain cereals. Low-sodium cereals. Corn or whole wheat flour tortillas. Whole grain cornbread. Whole grain crackers. Low-sodium crackers. Vegetables Fresh or frozen vegetables  (raw, steamed, roasted, or grilled). Low-sodium or reduced-sodium tomato and vegetable juices. Low-sodium or reduced-sodium tomato sauce and paste. Low-sodium or reduced-sodium canned vegetables.  Fruits All fresh, canned (in natural juice), or frozen fruits. Meat and Other Protein Products Ground beef (85% or leaner), grass-fed beef, or beef trimmed of fat. Skinless chicken or turkey. Ground chicken or turkey. Pork trimmed of fat. All fish and seafood. Eggs. Dried beans, peas, or lentils. Unsalted nuts and seeds. Unsalted canned beans. Dairy Low-fat dairy products, such as skim or 1% milk, 2% or reduced-fat cheeses, low-fat ricotta or cottage cheese, or plain low-fat yogurt. Low-sodium or reduced-sodium cheeses. Fats and Oils Tub margarines without trans fats. Light or reduced-fat mayonnaise and salad dressings (reduced sodium). Avocado. Safflower, olive, or canola oils. Natural peanut or almond butter. Other Unsalted popcorn and pretzels. The items listed above may not be a complete list of recommended foods or beverages. Contact your dietitian for more options. WHAT FOODS ARE NOT RECOMMENDED? Grains White bread. White pasta. White rice. Refined cornbread. Bagels and croissants. Crackers that contain trans fat. Vegetables Creamed or fried vegetables. Vegetables in a cheese sauce. Regular canned vegetables. Regular canned tomato sauce and paste. Regular tomato and vegetable juices. Fruits Dried fruits. Canned fruit in light or heavy syrup. Fruit juice. Meat and Other Protein Products Fatty cuts of meat. Ribs, chicken wings, bacon, sausage, bologna, salami, chitterlings, fatback, hot dogs, bratwurst, and packaged luncheon meats. Salted nuts and seeds. Canned beans with salt. Dairy Whole or 2% milk, cream, half-and-half, and cream cheese. Whole-fat or sweetened yogurt. Full-fat   cheeses or blue cheese. Nondairy creamers and whipped toppings. Processed cheese, cheese spreads, or cheese  curds. Condiments Onion and garlic salt, seasoned salt, table salt, and sea salt. Canned and packaged gravies. Worcestershire sauce. Tartar sauce. Barbecue sauce. Teriyaki sauce. Soy sauce, including reduced sodium. Steak sauce. Fish sauce. Oyster sauce. Cocktail sauce. Horseradish. Ketchup and mustard. Meat flavorings and tenderizers. Bouillon cubes. Hot sauce. Tabasco sauce. Marinades. Taco seasonings. Relishes. Fats and Oils Butter, stick margarine, lard, shortening, ghee, and bacon fat. Coconut, palm kernel, or palm oils. Regular salad dressings. Other Pickles and olives. Salted popcorn and pretzels. The items listed above may not be a complete list of foods and beverages to avoid. Contact your dietitian for more information. WHERE CAN I FIND MORE INFORMATION? National Heart, Lung, and [employer] Institute: www.nhlbi.nih.gov/health/health-topics/topics/dash/ Document Released: 09/09/2011 Document Revised: 02/04/2014 Document Reviewed: 07/25/2013 ExitCare Patient Information 2015 [employer]. This information is not intended to replace advice given to you by your health care provider. Make sure you discuss any questions you have with your health care provider. Diabetes Mellitus and Food It is important for you to manage your blood sugar (glucose) level. Your blood glucose level can be greatly affected by what you eat. Eating healthier foods in the appropriate amounts throughout the day at about the same time each day will help you control your blood glucose level. It can also help slow or prevent worsening of your diabetes mellitus. Healthy eating may even help you improve the level of your blood pressure and reach or maintain a healthy weight.  HOW CAN FOOD AFFECT ME? Carbohydrates Carbohydrates affect your blood glucose level more than any other type of food. Your dietitian will help you determine how many carbohydrates to eat at each meal and teach you how to count carbohydrates. Counting  carbohydrates is important to keep your blood glucose at a healthy level, especially if you are using insulin or taking certain medicines for diabetes mellitus. Alcohol Alcohol can cause sudden decreases in blood glucose (hypoglycemia), especially if you use insulin or take certain medicines for diabetes mellitus. Hypoglycemia can be a life-threatening condition. Symptoms of hypoglycemia (sleepiness, dizziness, and disorientation) are similar to symptoms of having too much alcohol.  If your health care provider has given you approval to drink alcohol, do so in moderation and use the following guidelines:  Women should not have more than one drink per day, and men should not have more than two drinks per day. One drink is equal to:  12 oz of beer.  5 oz of wine.  1 oz of hard liquor.  Do not drink on an empty stomach.  Keep yourself hydrated. Have water, diet soda, or unsweetened iced tea.  Regular soda, juice, and other mixers might contain a lot of carbohydrates and should be counted. WHAT FOODS ARE NOT RECOMMENDED? As you make food choices, it is important to remember that all foods are not the same. Some foods have fewer nutrients per serving than other foods, even though they might have the same number of calories or carbohydrates. It is difficult to get your body what it needs when you eat foods with fewer nutrients. Examples of foods that you should avoid that are high in calories and carbohydrates but low in nutrients include:  Trans fats (most processed foods list trans fats on the Nutrition Facts label).  Regular soda.  Juice.  Candy.  Sweets, such as cake, pie, doughnuts, and cookies.  Fried foods. WHAT FOODS CAN I EAT? Have nutrient-rich foods,   which will nourish your body and keep you healthy. The food you should eat also will depend on several factors, including:  The calories you need.  The medicines you take.  Your weight.  Your blood glucose level.  Your  blood pressure level.  Your cholesterol level. You also should eat a variety of foods, including:  Protein, such as meat, poultry, fish, tofu, nuts, and seeds (lean animal proteins are best).  Fruits.  Vegetables.  Dairy products, such as milk, cheese, and yogurt (low fat is best).  Breads, grains, pasta, cereal, rice, and beans.  Fats such as olive oil, trans fat-free margarine, canola oil, avocado, and olives. DOES EVERYONE WITH DIABETES MELLITUS HAVE THE SAME MEAL PLAN? Because every person with diabetes mellitus is different, there is not one meal plan that works for everyone. It is very important that you meet with a dietitian who will help you create a meal plan that is just right for you. Document Released: 06/17/2005 Document Revised: 09/25/2013 Document Reviewed: 08/17/2013 ExitCare Patient Information 2015 [employer]. This information is not intended to replace advice given to you by your health care provider. Make sure you discuss any questions you have with your health care provider. Diabetes and Exercise Exercising regularly is important. It is not just about losing weight. It has many health benefits, such as:  Improving your overall fitness, flexibility, and endurance.  Increasing your bone density.  Helping with weight control.  Decreasing your body fat.  Increasing your muscle strength.  Reducing stress and tension.  Improving your overall health. People with diabetes who exercise gain additional benefits because exercise:  Reduces appetite.  Improves the body's use of blood sugar (glucose).  Helps lower or control blood glucose.  Decreases blood pressure.  Helps control blood lipids (such as cholesterol and triglycerides).  Improves the body's use of the hormone insulin by:  Increasing the body's insulin sensitivity.  Reducing the body's insulin needs.  Decreases the risk for heart disease because exercising:  Lowers cholesterol and  triglycerides levels.  Increases the levels of good cholesterol (such as high-density lipoproteins [HDL]) in the body.  Lowers blood glucose levels. YOUR ACTIVITY PLAN  Choose an activity that you enjoy and set realistic goals. Your health care provider or diabetes educator can help you make an activity plan that works for you. Exercise regularly as directed by your health care provider. This includes:  Performing resistance training twice a week such as push-ups, sit-ups, lifting weights, or using resistance bands.  Performing 150 minutes of cardio exercises each week such as walking, running, or playing sports.  Staying active and spending no more than 90 minutes at one time being inactive. Even short bursts of exercise are good for you. Three 10-minute sessions spread throughout the day are just as beneficial as a single 30-minute session. Some exercise ideas include:  Taking the dog for a walk.  Taking the stairs instead of the elevator.  Dancing to your favorite song.  Doing an exercise video.  Doing your favorite exercise with a friend. RECOMMENDATIONS FOR EXERCISING WITH TYPE 1 OR TYPE 2 DIABETES   Check your blood glucose before exercising. If blood glucose levels are greater than 240 mg/dL, check for urine ketones. Do not exercise if ketones are present.  Avoid injecting insulin into areas of the body that are going to be exercised. For example, avoid injecting insulin into:  The arms when playing tennis.  The legs when jogging.  Keep a record of:  Food   intake before and after you exercise.  Expected peak times of insulin action.  Blood glucose levels before and after you exercise.  The type and amount of exercise you have done.  Review your records with your health care provider. Your health care provider will help you to develop guidelines for adjusting food intake and insulin amounts before and after exercising.  If you take insulin or oral hypoglycemic  agents, watch for signs and symptoms of hypoglycemia. They include:  Dizziness.  Shaking.  Sweating.  Chills.  Confusion.  Drink plenty of water while you exercise to prevent dehydration or heat stroke. Body water is lost during exercise and must be replaced.  Talk to your health care provider before starting an exercise program to make sure it is safe for you. Remember, almost any type of activity is better than none. Document Released: 12/11/2003 Document Revised: 02/04/2014 Document Reviewed: 02/27/2013 ExitCare Patient Information 2015 [employer]. This information is not intended to replace advice given to you by your health care provider. Make sure you discuss any questions you have with your health care provider.  

## 2014-07-09 ENCOUNTER — Telehealth: Payer: Self-pay | Admitting: Emergency Medicine

## 2014-07-09 LAB — COMPLETE METABOLIC PANEL WITH GFR
ALBUMIN: 3.8 g/dL (ref 3.5–5.2)
ALT: 76 U/L — AB (ref 0–35)
AST: 63 U/L — AB (ref 0–37)
Alkaline Phosphatase: 88 U/L (ref 39–117)
BUN: 7 mg/dL (ref 6–23)
CALCIUM: 9.6 mg/dL (ref 8.4–10.5)
CHLORIDE: 105 meq/L (ref 96–112)
CO2: 28 mEq/L (ref 19–32)
CREATININE: 0.59 mg/dL (ref 0.50–1.10)
GFR, Est African American: >89 mL/min
GFR, Est Non African American: >89 mL/min
Glucose, Bld: 38 mg/dL — CL (ref 70–99)
POTASSIUM: 5.1 meq/L (ref 3.5–5.3)
Sodium: 140 mEq/L (ref 135–145)
Total Bilirubin: 0.3 mg/dL (ref 0.2–1.2)
Total Protein: 7.5 g/dL (ref 6.0–8.3)

## 2014-07-09 LAB — LIPID PANEL
CHOLESTEROL: 197 mg/dL (ref 0–200)
HDL: 45 mg/dL (ref >39)
LDL Cholesterol: 138 mg/dL — ABNORMAL HIGH (ref 0–99)
Total CHOL/HDL Ratio: 4.4 Ratio
Triglycerides: 72 mg/dL (ref <150)
VLDL: 14 mg/dL (ref 0–40)

## 2014-07-09 LAB — MICROALBUMIN / CREATININE URINE RATIO
CREATININE, URINE: 69.6 mg/dL
MICROALB UR: 4.4 mg/dL — AB (ref <2.0)
Microalb Creat Ratio: 63.2 mg/g — ABNORMAL HIGH (ref 0.0–30.0)

## 2014-07-09 NOTE — Telephone Encounter (Signed)
Pt called today to assess blood sugar after critical lab from [facility] called in for 38. Pt states when she left clinic yesterday, she felt a little shaky but went to eat. States she checked blood sugar once she returned home for 128. States she is doing fine. Pt instructed per Dr. Doreene Burke, to call clinic if changes in condition develop.

## 2014-07-10 ENCOUNTER — Other Ambulatory Visit: Payer: Self-pay | Admitting: Internal Medicine

## 2014-07-10 MED ORDER — INSULIN NPH ISOPHANE & REGULAR (70-30) 100 UNIT/ML ~~LOC~~ SUSP: 20.0000 [IU] | Freq: Two times a day (BID) | SUBCUTANEOUS | Status: DC

## 2014-07-11 ENCOUNTER — Telehealth: Payer: Self-pay | Admitting: Internal Medicine

## 2014-07-11 NOTE — Telephone Encounter (Signed)
Patient has come in today to f/u about her lab results; patient stated that she received a call about her results being abnormal; please f/u with patient in the lobby

## 2014-07-17 ENCOUNTER — Other Ambulatory Visit: Payer: BC Managed Care – PPO

## 2014-08-08 ENCOUNTER — Other Ambulatory Visit (HOSPITAL_COMMUNITY): Admission: RE | Admit: 2014-08-08 | Payer: BC Managed Care – PPO | Source: Ambulatory Visit | Admitting: Family Medicine

## 2014-08-08 ENCOUNTER — Encounter: Payer: Self-pay | Admitting: Family Medicine

## 2014-08-08 ENCOUNTER — Telehealth: Payer: Self-pay | Admitting: Internal Medicine

## 2014-08-08 ENCOUNTER — Ambulatory Visit: Payer: BC Managed Care – PPO | Attending: Family Medicine | Admitting: Family Medicine

## 2014-08-08 VITALS — BP 135/82 | HR 103 | Temp 99.5°F | Resp 18 | Ht 66.0 in | Wt 246.0 lb

## 2014-08-08 DIAGNOSIS — Z202 Contact with and (suspected) exposure to infections with a predominantly sexual mode of transmission: Secondary | ICD-10-CM

## 2014-08-08 MED ORDER — METRONIDAZOLE 500 MG PO TABS: 2000.0000 mg | ORAL_TABLET | Freq: Once | ORAL | Status: DC

## 2014-08-08 NOTE — Assessment & Plan Note (Signed)
A: exposure to trichomonas partner currently being treated. P: Wet prep ad Gc/chlam done today. Patient declines HIV and RPR, reports recently being treated.  Flagyl 2000 mg PO x one today Protected sex for next 7 days

## 2014-08-08 NOTE — Telephone Encounter (Signed)
Patient has come in today to ask for an antibiotic; please f/u with patient about this request;

## 2014-08-08 NOTE — Progress Notes (Signed)
   Subjective:    Patient ID: Cenia Zaragosa, female    DOB: 1970/10/07, 43 y.o.   MRN: [id number] CC: STD exposure  HPI 43 y.o. F presents for the following:  1. STD exposure: to trichomonas. Had normal pap with trichomonas in 05/2014. Took flagyl after diagnosis. Has unprotected sex with one long term partner. Her long term partner recently diagnosed with trichomonas.   Soc hx: current every day smoker  Review of Systems As per HPI     Objective:   Physical Exam BP 135/82 mmHg  Pulse 103  Temp(Src) 99.5 F (37.5 C) (Oral)  Resp 18  Ht 5\' 6"  (1.676 m)  Wt 246 lb (111.585 kg)  BMI 39.72 kg/m2  SpO2 98% General appearance: alert, cooperative and no distress Pelvic: cervix normal in appearance, external genitalia normal, no adnexal masses or tenderness, no cervical motion tenderness and positive findings: vaginal discharge:  moderate amount of bubbly white discharge with mild odor        Assessment & Plan:

## 2014-08-08 NOTE — Patient Instructions (Signed)
Ms. Laurence Ferrari,  Take flagyl 2000 mg once to treat trichomonas. You will be called with test results. Use condoms with sex for next 7 days.   Dr. Adrian Blackwater

## 2014-08-08 NOTE — Progress Notes (Signed)
Pt stated exposed to Trichomonas Pt stated no vaginal discharge, no itching

## 2014-08-12 LAB — CERVICOVAGINAL ANCILLARY ONLY
CHLAMYDIA, DNA PROBE: NEGATIVE
Neisseria Gonorrhea: NEGATIVE
Wet Prep (BD Affirm): NEGATIVE
Wet Prep (BD Affirm): POSITIVE — AB
Wet Prep (BD Affirm): POSITIVE — AB

## 2014-08-15 ENCOUNTER — Telehealth: Payer: Self-pay | Admitting: *Deleted

## 2014-08-15 NOTE — Telephone Encounter (Signed)
-----   Message from Minerva Ends, MD sent at 08/12/2014 10:36 AM EST ----- BV and trich on wet prep Patient treated with flagyl

## 2014-08-15 NOTE — Telephone Encounter (Signed)
Left voice message to return call 

## 2014-12-02 NOTE — Progress Notes (Signed)
Erroneous Encounter

## 2015-02-06 ENCOUNTER — Telehealth: Payer: Self-pay | Admitting: Internal Medicine

## 2015-02-06 NOTE — Telephone Encounter (Signed)
Patient had a visit to the ER in [place] on Friday and was told to f/u with PCP asap for a GYN referral; PCP does not have any available appointments for two weeks; please f/u with patient about referral request

## 2015-02-11 NOTE — Telephone Encounter (Signed)
Attempted to call patient back to discuss her needs but her phone number listed does not work.

## 2015-02-25 ENCOUNTER — Telehealth: Payer: Self-pay | Admitting: Internal Medicine

## 2015-02-25 NOTE — Telephone Encounter (Signed)
Patient has come in today to express her concerns and frustrations with the PCP; Patient states   "do not edit any of my words, I feel as though my PCP does not care about me as a patient. There have been times I have called up here multiple times and have not received a call back. Instead I have been going to the ED and because my PCP doesn't come to the hospital, when I am in the ED the nurses and doctors will not treat my condition with out his approval. I really like this place and don't want to go anywhere else but I honestly don't think my doctor cares"   Please f/u with patient

## 2015-03-13 ENCOUNTER — Ambulatory Visit: Payer: Self-pay | Admitting: Internal Medicine

## 2015-05-05 ENCOUNTER — Other Ambulatory Visit: Payer: Self-pay | Admitting: Internal Medicine

## 2015-05-07 ENCOUNTER — Other Ambulatory Visit: Payer: Self-pay

## 2015-05-07 ENCOUNTER — Ambulatory Visit: Payer: Self-pay | Admitting: Internal Medicine

## 2015-05-07 DIAGNOSIS — I10 Essential (primary) hypertension: Secondary | ICD-10-CM

## 2015-05-07 MED ORDER — AMLODIPINE BESYLATE 10 MG PO TABS: 10.0000 mg | ORAL_TABLET | Freq: Every day | ORAL | Status: DC

## 2015-05-07 MED ORDER — HYDRALAZINE HCL 25 MG PO TABS: 25.0000 mg | ORAL_TABLET | Freq: Three times a day (TID) | ORAL | Status: DC

## 2015-05-29 ENCOUNTER — Inpatient Hospital Stay: Payer: Self-pay | Admitting: Internal Medicine

## 2016-11-30 LAB — HM DIABETES EYE EXAM

## 2016-12-01 ENCOUNTER — Telehealth: Payer: Self-pay | Admitting: Family Medicine

## 2016-12-01 ENCOUNTER — Encounter: Payer: Self-pay | Admitting: Family Medicine

## 2016-12-01 ENCOUNTER — Encounter (INDEPENDENT_AMBULATORY_CARE_PROVIDER_SITE_OTHER): Payer: Self-pay

## 2016-12-01 ENCOUNTER — Ambulatory Visit (INDEPENDENT_AMBULATORY_CARE_PROVIDER_SITE_OTHER): Payer: No Typology Code available for payment source | Admitting: Family Medicine

## 2016-12-01 VITALS — BP 153/86 | HR 91 | Temp 98.5°F | Ht 65.0 in | Wt 210.4 lb

## 2016-12-01 DIAGNOSIS — L732 Hidradenitis suppurativa: Secondary | ICD-10-CM | POA: Diagnosis not present

## 2016-12-01 DIAGNOSIS — Z794 Long term (current) use of insulin: Secondary | ICD-10-CM

## 2016-12-01 DIAGNOSIS — F17200 Nicotine dependence, unspecified, uncomplicated: Secondary | ICD-10-CM | POA: Diagnosis not present

## 2016-12-01 DIAGNOSIS — I10 Essential (primary) hypertension: Secondary | ICD-10-CM

## 2016-12-01 DIAGNOSIS — E1159 Type 2 diabetes mellitus with other circulatory complications: Secondary | ICD-10-CM | POA: Diagnosis not present

## 2016-12-01 DIAGNOSIS — E1140 Type 2 diabetes mellitus with diabetic neuropathy, unspecified: Secondary | ICD-10-CM | POA: Insufficient documentation

## 2016-12-01 DIAGNOSIS — I152 Hypertension secondary to endocrine disorders: Secondary | ICD-10-CM

## 2016-12-01 LAB — BAYER DCA HB A1C WAIVED: HB A1C: 7.9 % — AB (ref <7.0)

## 2016-12-01 MED ORDER — PREGABALIN 75 MG PO CAPS: 75.0000 mg | ORAL_CAPSULE | Freq: Two times a day (BID) | ORAL | 3 refills | Status: DC

## 2016-12-01 MED ORDER — DULAGLUTIDE 1.5 MG/0.5ML ~~LOC~~ SOAJ: 1.5000 mg | SUBCUTANEOUS | 2 refills | Status: DC

## 2016-12-01 MED ORDER — CEFTRIAXONE SODIUM 1 G IJ SOLR
1.0000 g | Freq: Once | INTRAMUSCULAR | Status: AC
  Administered 2016-12-01: 1 g via INTRAMUSCULAR

## 2016-12-01 MED ORDER — METFORMIN HCL 1000 MG PO TABS: 1000.0000 mg | ORAL_TABLET | Freq: Two times a day (BID) | ORAL | 2 refills | Status: DC

## 2016-12-01 MED ORDER — NICOTINE 14 MG/24HR TD PT24: 14.0000 mg | MEDICATED_PATCH | Freq: Every day | TRANSDERMAL | 0 refills | Status: DC

## 2016-12-01 MED ORDER — DULAGLUTIDE 0.75 MG/0.5ML ~~LOC~~ SOAJ: 0.7500 mg | SUBCUTANEOUS | 0 refills | Status: DC

## 2016-12-01 NOTE — Progress Notes (Signed)
BP (!) 153/86   Pulse 91   Temp 98.5 F (36.9 C) (Oral)   Ht _0  (1.651 m)   Wt 210 lb 6 oz (95.4 kg)   LMP 11/07/2016 (Exact Date)   BMI 35.01 kg/m    Subjective:    Patient ID: Caitlin Fritz, female    DOB: 03/05/1971, 46 y.o.   MRN: [id number]  HPI: Caitlin Fritz is a 46 y.o. female presenting on 12/01/2016 for Establish Care (new patient)   HPI Hypertension checkup Patient is coming in to establish care with our office as a new patient. Her blood pressure today is 153/86. She says that she is currently on amlodipine and has been taking it but says her blood pressure usually runs better when she is on it. The last time they added a new medication she said she felt very lightheaded and dizzy. Patient denies headaches, blurred vision, chest pains, shortness of breath, or weakness. Denies any side effects from medication and is content with current medication. She is currently still smoking but would like to quit.  Type 2 diabetes mellitus Patient comes in today for recheck of his diabetes. He has been currently taking metformin. He is not currently on an ACE inhibitor. He has not seen an ophthalmologist this year. He admits to issues with his feet. She has had neuropathy and muscle aches that have been going on for a few years. She feels like they're worsening and she used to be on gabapentin and does not think it was working for her.  Recurrent hidradenitis Patient has had issues with recurrent hidradenitis and some of had to be surgically removed. She feels like she is getting this dart of one under her left axilla. She denies any drainage or fevers or chills but does have a little swelling and tenderness under her left axilla.  Relevant past medical, surgical, family and social history reviewed and updated as indicated. Interim medical history since our last visit reviewed. Allergies and medications reviewed and updated.  Review of Systems  Constitutional: Negative for  chills and fever.  HENT: Negative for congestion, ear discharge and ear pain.   Eyes: Negative for redness and visual disturbance.  Respiratory: Negative for chest tightness and shortness of breath.   Cardiovascular: Negative for chest pain and leg swelling.  Genitourinary: Negative for difficulty urinating and dysuria.  Musculoskeletal: Negative for back pain and gait problem.  Skin: Positive for color change. Negative for rash.  Neurological: Negative for dizziness, light-headedness and headaches.  Psychiatric/Behavioral: Negative for agitation and behavioral problems.  All other systems reviewed and are negative.  Per HPI unless specifically indicated above  Social History   Social History  . Marital status: Married    Spouse name: N/A  . Number of children: N/A  . Years of education: N/A   Occupational History  . Not on file.   Social History Main Topics  . Smoking status: Current Every Day Smoker    Packs/day: 0.50    Types: Cigarettes  . Smokeless tobacco: Never Used  . Alcohol use No  . Drug use: No  . Sexual activity: Yes   Other Topics Concern  . Not on file   Social History Narrative  . No narrative on file    Past Surgical History:  Procedure Laterality Date  . INCISION AND DRAINAGE     bilateral, hydradenitis    Family History  Problem Relation Age of Onset  . Hyperlipidemia Mother   . Hypertension Mother   .  Diabetes Father   . Heart disease Father   . Hypertension Father   . Cancer Maternal Grandmother   . Diabetes Maternal Grandfather   . Diabetes Paternal Grandfather     Allergies as of 12/01/2016      Reactions   Clonidine Derivatives Other (See Comments)   seizures   Adhesive [tape] Rash   Eye Drops [tetrahydrozoline] Rash      Medication List       Accurate as of 12/01/16 10:15 AM. Always use your most recent med list.          amLODipine 10 MG tablet Commonly known as:  NORVASC Take 10 mg by mouth daily.   Dulaglutide  0.75 MG/0.5ML Sopn Commonly known as:  TRULICITY Inject 7.65 mg into the skin once a week.   Dulaglutide 1.5 MG/0.5ML Sopn Commonly known as:  TRULICITY Inject 1.5 mg into the skin once a week.   metFORMIN 1000 MG tablet Commonly known as:  GLUCOPHAGE Take 1 tablet (1,000 mg total) by mouth 2 (two) times daily with a meal.   nicotine 14 mg/24hr patch Commonly known as:  NICODERM CQ - dosed in mg/24 hours Place 1 patch (14 mg total) onto the skin daily.   pregabalin 75 MG capsule Commonly known as:  LYRICA Take 1 capsule (75 mg total) by mouth 2 (two) times daily.          Objective:    BP (!) 153/86   Pulse 91   Temp 98.5 F (36.9 C) (Oral)   Ht _0  (1.651 m)   Wt 210 lb 6 oz (95.4 kg)   LMP 11/07/2016 (Exact Date)   BMI 35.01 kg/m   Wt Readings from Last 3 Encounters:  12/01/16 210 lb 6 oz (95.4 kg)    Physical Exam  Constitutional: She is oriented to person, place, and time. She appears well-developed and well-nourished. No distress.  Eyes: Conjunctivae are normal. No scleral icterus.  Neck: Neck supple. No thyromegaly present.  Cardiovascular: Normal rate, regular rhythm, normal heart sounds and intact distal pulses.   No murmur heard. Pulmonary/Chest: Effort normal and breath sounds normal. No respiratory distress. She has no wheezes.  Musculoskeletal: Normal range of motion. She exhibits no edema.  Lymphadenopathy:    She has no cervical adenopathy.  Neurological: She is alert and oriented to person, place, and time. Coordination normal.  Skin: Skin is warm and dry. Lesion (Small tender swollen and erythematous lesion under the left axilla.) noted. No rash noted. She is not diaphoretic.  Psychiatric: She has a normal mood and affect. Her behavior is normal.  Nursing note and vitals reviewed.  No results found for this or any previous visit.    Assessment & Plan:   Problem List Items Addressed This Visit      Cardiovascular and Mediastinum    Hypertension associated with diabetes ([place])   Relevant Medications   amLODipine (NORVASC) 10 MG tablet   metFORMIN (GLUCOPHAGE) 1000 MG tablet   Dulaglutide (TRULICITY) 4.65 KP/5.4SF SOPN   Dulaglutide (TRULICITY) 1.5 KC/1.2XN SOPN   Other Relevant Orders   CMP14+EGFR   Lipid panel   CBC with Differential/Platelet     Endocrine   Type 2 diabetes mellitus with diabetic neuropathy, unspecified (HCC) - Primary   Relevant Medications   metFORMIN (GLUCOPHAGE) 1000 MG tablet   Dulaglutide (TRULICITY) 1.70 YF/7.4BS SOPN   Dulaglutide (TRULICITY) 1.5 WH/6.7RF SOPN   Other Relevant Orders   Bayer DCA Hb A1c Waived   CMP14+EGFR  Lipid panel   CBC with Differential/Platelet    Other Visit Diagnoses    Hydradenitis       Relevant Medications   cefTRIAXone (ROCEPHIN) injection 1 g   Smoking       Relevant Medications   nicotine (NICODERM CQ - DOSED IN MG/24 HOURS) 14 mg/24hr patch       Follow up plan: Return in about 3 months (around 02/28/2017), or if symptoms worsen or fail to improve, for f/u w/ me in 3 months, also needs appt with tammy.  Caryl Pina, MD [facility] 12/01/2016, 10:15 AM

## 2016-12-02 LAB — CMP14+EGFR
ALK PHOS: 102 IU/L (ref 39–117)
ALT: 15 IU/L (ref 0–32)
AST: 12 IU/L (ref 0–40)
Albumin/Globulin Ratio: 1.2 (ref 1.2–2.2)
Albumin: 3.9 g/dL (ref 3.5–5.5)
BUN/Creatinine Ratio: 16 (ref 9–23)
BUN: 9 mg/dL (ref 6–24)
CHLORIDE: 101 mmol/L (ref 96–106)
CO2: 21 mmol/L (ref 18–29)
Calcium: 9.3 mg/dL (ref 8.7–10.2)
Creatinine, Ser: 0.56 mg/dL — ABNORMAL LOW (ref 0.57–1.00)
GFR calc Af Amer: 130 mL/min/{1.73_m2} (ref >59)
GFR calc non Af Amer: 113 mL/min/{1.73_m2} (ref >59)
GLUCOSE: 141 mg/dL — AB (ref 65–99)
Globulin, Total: 3.2 g/dL (ref 1.5–4.5)
Potassium: 4.5 mmol/L (ref 3.5–5.2)
Sodium: 141 mmol/L (ref 134–144)
Total Protein: 7.1 g/dL (ref 6.0–8.5)

## 2016-12-02 LAB — LIPID PANEL
CHOLESTEROL TOTAL: 178 mg/dL (ref 100–199)
Chol/HDL Ratio: 3.9 ratio units (ref 0.0–4.4)
HDL: 46 mg/dL (ref >39)
LDL Calculated: 117 mg/dL — ABNORMAL HIGH (ref 0–99)
TRIGLYCERIDES: 76 mg/dL (ref 0–149)
VLDL CHOLESTEROL CAL: 15 mg/dL (ref 5–40)

## 2016-12-02 LAB — CBC WITH DIFFERENTIAL/PLATELET
Basophils Absolute: 0 10*3/uL (ref 0.0–0.2)
Basos: 0 %
EOS (ABSOLUTE): 0.2 10*3/uL (ref 0.0–0.4)
EOS: 2 %
HEMATOCRIT: 38.1 % (ref 34.0–46.6)
HEMOGLOBIN: 11.8 g/dL (ref 11.1–15.9)
IMMATURE GRANULOCYTES: 0 %
Immature Grans (Abs): 0 10*3/uL (ref 0.0–0.1)
LYMPHS ABS: 3.1 10*3/uL (ref 0.7–3.1)
Lymphs: 35 %
MCH: 23.3 pg — ABNORMAL LOW (ref 26.6–33.0)
MCHC: 31 g/dL — AB (ref 31.5–35.7)
MCV: 75 fL — ABNORMAL LOW (ref 79–97)
MONOCYTES: 5 %
Monocytes Absolute: 0.4 10*3/uL (ref 0.1–0.9)
Neutrophils Absolute: 5.1 10*3/uL (ref 1.4–7.0)
Neutrophils: 58 %
Platelets: 274 10*3/uL (ref 150–379)
RBC: 5.07 x10E6/uL (ref 3.77–5.28)
RDW: 15.6 % — ABNORMAL HIGH (ref 12.3–15.4)
WBC: 8.8 10*3/uL (ref 3.4–10.8)

## 2016-12-02 MED ORDER — DULOXETINE HCL 30 MG PO CPEP: 30.0000 mg | ORAL_CAPSULE | Freq: Every day | ORAL | 3 refills | Status: DC

## 2016-12-02 MED ORDER — EXENATIDE ER 2 MG ~~LOC~~ PEN: 2.0000 mg | PEN_INJECTOR | SUBCUTANEOUS | 3 refills | Status: DC

## 2016-12-02 NOTE — Telephone Encounter (Signed)
Spoke to pt and she said with her insurance and the coupon card it was going to be $391 a month. She is aware we sent in Bydureon and Cymbalta and she will call back if she can't afford these medications.

## 2016-12-03 ENCOUNTER — Telehealth: Payer: Self-pay | Admitting: Family Medicine

## 2016-12-03 ENCOUNTER — Encounter: Payer: Self-pay | Admitting: Family Medicine

## 2016-12-03 MED ORDER — LIRAGLUTIDE 18 MG/3ML ~~LOC~~ SOPN: 1.2000 mg | PEN_INJECTOR | Freq: Every day | SUBCUTANEOUS | 2 refills | Status: DC

## 2016-12-03 NOTE — Telephone Encounter (Signed)
Left detailed message for pt Coupon to front for pt pick up

## 2016-12-03 NOTE — Telephone Encounter (Signed)
Please review and advise.

## 2016-12-06 ENCOUNTER — Telehealth: Payer: Self-pay | Admitting: Family Medicine

## 2016-12-06 MED ORDER — LIRAGLUTIDE 18 MG/3ML ~~LOC~~ SOPN: 1.2000 mg | PEN_INJECTOR | Freq: Every day | SUBCUTANEOUS | 2 refills | Status: DC

## 2016-12-06 NOTE — Telephone Encounter (Signed)
RX changed per pt request Pt notified

## 2016-12-09 ENCOUNTER — Encounter: Payer: Self-pay | Admitting: Family Medicine

## 2016-12-09 MED ORDER — SULFAMETHOXAZOLE-TRIMETHOPRIM 800-160 MG PO TABS: 1.0000 | ORAL_TABLET | Freq: Two times a day (BID) | ORAL | 0 refills | Status: DC

## 2016-12-10 ENCOUNTER — Encounter: Payer: Self-pay | Admitting: Physician Assistant

## 2016-12-10 ENCOUNTER — Ambulatory Visit (INDEPENDENT_AMBULATORY_CARE_PROVIDER_SITE_OTHER): Payer: No Typology Code available for payment source | Admitting: Physician Assistant

## 2016-12-10 VITALS — BP 137/86 | HR 95 | Temp 98.5°F | Ht 65.0 in | Wt 207.6 lb

## 2016-12-10 DIAGNOSIS — L732 Hidradenitis suppurativa: Secondary | ICD-10-CM | POA: Diagnosis not present

## 2016-12-10 MED ORDER — CEFTRIAXONE SODIUM 1 G IJ SOLR
1.0000 g | Freq: Once | INTRAMUSCULAR | Status: AC
  Administered 2016-12-10: 1 g via INTRAMUSCULAR

## 2016-12-10 NOTE — Progress Notes (Signed)
BP 137/86   Pulse 95   Temp 98.5 F (36.9 C) (Oral)   Ht _0  (1.651 m)   Wt 207 lb 9.6 oz (94.2 kg)   LMP 11/07/2016 (Exact Date)   Breastfeeding? Unknown   BMI 34.55 kg/m    Subjective:    Patient ID: Caitlin Fritz, female    DOB: 21-Jul-1971, 46 y.o.   MRN: [id number]  HPI: Jesicca Dipierro is a 46 y.o. female presenting on 12/10/2016 for Recurrent Skin Infections (On right side lower back )  Has had exacerbations of hidradenitis suppurativa for many years, even had 6 surgeries in one month at [facility] for severe episode in the left axilla and breast. She had this flare up just one day ago and has taken one dose of Bactrim. Felt feverish and the area got hotter today so she wanted to be seen. Remembers that Rocephin did help a lot in the past.   Relevant past medical, surgical, family and social history reviewed and updated as indicated. Allergies and medications reviewed and updated.  Past Medical History:  Diagnosis Date  . Asthma   . Diabetes mellitus without complication ([place])   . GERD (gastroesophageal reflux disease)   . Hydradenitis   . Hypertension   . Seizures ([place])    once after taking clonidine  . Stroke [facility])    mini stroke June 2015    Past Surgical History:  Procedure Laterality Date  . INCISION AND DRAINAGE     bilateral, hydradenitis    Review of Systems  Constitutional: Negative.  Negative for activity change, fatigue and fever.  HENT: Negative.   Eyes: Negative.   Respiratory: Negative.  Negative for cough.   Cardiovascular: Negative.  Negative for chest pain.  Gastrointestinal: Negative.  Negative for abdominal pain.  Endocrine: Negative.   Genitourinary: Negative.  Negative for dysuria.  Musculoskeletal: Negative.   Skin: Positive for color change and wound.  Neurological: Negative.     Allergies as of 12/10/2016      Reactions   Clonidine Derivatives Other (See Comments)   Seizures    Clonidine Derivatives Other (See Comments)   seizures    Prednisone Other (See Comments)   Eye drops only-temporary blindness    Adhesive [tape] Rash   Eye Drops [tetrahydrozoline] Rash      Medication List       Accurate as of 12/10/16  5:28 PM. Always use your most recent med list.          amLODipine 10 MG tablet Commonly known as:  NORVASC Take 1 tablet (10 mg total) by mouth daily.   DULoxetine 30 MG capsule Commonly known as:  CYMBALTA Take 1 capsule (30 mg total) by mouth daily.   glucose monitoring kit monitoring kit 1 each by Does not apply route 4 (four) times daily - after meals and at bedtime. 1 month Diabetic Testing Supplies for QAC-QHS accuchecks.   Insulin Syringe-Needle U-100 25G X 5/8" 1 ML Misc Test BG 3 times daily   liraglutide 18 MG/3ML Sopn Inject 0.2 mLs (1.2 mg total) into the skin daily. Take 0.6 mg for first week and then increase to 1.2 mg   metFORMIN 1000 MG tablet Commonly known as:  GLUCOPHAGE Take 1 tablet (1,000 mg total) by mouth 2 (two) times daily with a meal.   nicotine 14 mg/24hr patch Commonly known as:  NICODERM CQ - dosed in mg/24 hours Place 1 patch (14 mg total) onto the skin daily.   sulfamethoxazole-trimethoprim 800-160 MG  tablet Commonly known as:  BACTRIM DS,SEPTRA DS Take 1 tablet by mouth 2 (two) times daily.          Objective:    BP 137/86   Pulse 95   Temp 98.5 F (36.9 C) (Oral)   Ht _0  (1.651 m)   Wt 207 lb 9.6 oz (94.2 kg)   LMP 11/07/2016 (Exact Date)   Breastfeeding? Unknown   BMI 34.55 kg/m   Allergies  Allergen Reactions  . Clonidine Derivatives Other (See Comments)    Seizures   . Clonidine Derivatives Other (See Comments)    seizures  . Prednisone Other (See Comments)    Eye drops only-temporary blindness   . Adhesive [Tape] Rash  . Eye Drops [Tetrahydrozoline] Rash    Physical Exam  Constitutional: She is oriented to person, place, and time. She appears well-developed and well-nourished.  HENT:  Head: Normocephalic and atraumatic.    Eyes: Conjunctivae and EOM are normal. Pupils are equal, round, and reactive to light.  Cardiovascular: Normal rate, regular rhythm, normal heart sounds and intact distal pulses.   Pulmonary/Chest: Effort normal and breath sounds normal.  Abdominal: Soft. Bowel sounds are normal.  Neurological: She is alert and oriented to person, place, and time. She has normal reflexes.  Skin: Skin is warm and dry. Lesion and rash noted. Rash is pustular. There is erythema.     2 small pustules on firm long erythematous base.    Psychiatric: She has a normal mood and affect. Her behavior is normal. Judgment and thought content normal.  Nursing note and vitals reviewed.       Assessment & Plan:   1. Hidradenitis suppurativa - cefTRIAXone (ROCEPHIN) injection 1 g; Inject 1 g into the muscle once. Complete bactrim DS course. Tolerated injection of rocephin  Continue all other maintenance medications as listed above.  Follow up plan: Return if symptoms worsen or fail to improve.  Educational handout given for hidradenitis suppurativa  Terald Sleeper PA-C [facility] [address]  [place], [place] [address] [phone]   12/10/2016, 5:28 PM

## 2016-12-10 NOTE — Patient Instructions (Signed)
Hidradenitis Suppurativa Hidradenitis suppurativa is a long-term (chronic) skin disease that starts with blocked sweat glands or hair follicles. Bacteria may grow in these blocked openings of your skin. Hidradenitis suppurativa is like a severe form of acne that develops in areas of your body where acne would be unusual. It is most likely to affect the areas of your body where skin rubs against skin and becomes moist. This includes your:  Underarms.  Groin.  Genital areas.  Buttocks.  Upper thighs.  Breasts. Hidradenitis suppurativa may start out with small pimples. The pimples can develop into deep sores that break open (rupture) and drain pus. Over time your skin may thicken and become scarred. Hidradenitis suppurativa cannot be passed from person to person. What are the causes? The exact cause of hidradenitis suppurativa is not known. This condition may be due to:  Female and female hormones. The condition is rare before and after puberty.  An overactive body defense system (immune system). Your immune system may overreact to the blocked hair follicles or sweat glands and cause swelling and pus-filled sores. What increases the risk? You may have a higher risk of hidradenitis suppurativa if you:  Are a woman.  Are between ages 11 and 55.  Have a family history of hidradenitis suppurativa.  Have a personal history of acne.  Are overweight.  Smoke.  Take the drug lithium. What are the signs or symptoms? The first signs of an outbreak are usually painful skin bumps that look like pimples. As the condition progresses:  Skin bumps may get bigger and grow deeper into the skin.  Bumps under the skin may rupture and drain smelly pus.  Skin may become itchy and infected.  Skin may thicken and scar.  Drainage may continue through tunnels under the skin (fistulas).  Walking and moving your arms can become painful. How is this diagnosed? Your health care provider may diagnose  hidradenitis suppurativa based on your medical history and your signs and symptoms. A physical exam will also be done. You may need to see a health care provider who specializes in skin diseases (dermatologist). You may also have tests done to confirm the diagnosis. These can include:  Swabbing a sample of pus or drainage from your skin so it can be sent to the lab and tested for infection.  Blood tests to check for infection. How is this treated? The same treatment will not work for everybody with hidradenitis suppurativa. Your treatment will depend on how severe your symptoms are. You may need to try several treatments to find what works best for you. Part of your treatment may include cleaning and bandaging (dressing) your wounds. You may also have to take medicines, such as the following:  Antibiotics.  Acne medicines.  Medicines to block or suppress the immune system.  A diabetes medicine (metformin) is sometimes used to treat this condition.  For women, birth control pills can sometimes help relieve symptoms. You may need surgery if you have a severe case of hidradenitis suppurativa that does not respond to medicine. Surgery may involve:  Using a laser to clear the skin and remove hair follicles.  Opening and draining deep sores.  Removing the areas of skin that are diseased and scarred. Follow these instructions at home:  Learn as much as you can about your disease, and work closely with your health care providers.  Take medicines only as directed by your health care provider.  If you were prescribed an antibiotic medicine, finish it all even   if you start to feel better.  If you are overweight, losing weight may be very helpful. Try to reach and maintain a healthy weight.  Do not use any tobacco products, including cigarettes, chewing tobacco, or electronic cigarettes. If you need help quitting, ask your health care provider.  Do not shave the areas where you get  hidradenitis suppurativa.  Do not wear deodorant.  Wear loose-fitting clothes.  Try not to overheat and get sweaty.  Take a daily bleach bath as directed by your health care provider.  Fill your bathtub halfway with water.  Pour in  cup of unscented household bleach.  Soak for 5-10 minutes.  Cover sore areas with a warm, clean washcloth (compress) for 5-10 minutes. Contact a health care provider if:  You have a flare-up of hidradenitis suppurativa.  You have chills or a fever.  You are having trouble controlling your symptoms at home. This information is not intended to replace advice given to you by your health care provider. Make sure you discuss any questions you have with your health care provider. Document Released: 05/04/2004 Document Revised: 02/26/2016 Document Reviewed: 12/21/2013 Elsevier Interactive Patient Education  2017 Elsevier Inc.  

## 2016-12-15 ENCOUNTER — Encounter: Payer: Self-pay | Admitting: Family Medicine

## 2016-12-23 ENCOUNTER — Ambulatory Visit (INDEPENDENT_AMBULATORY_CARE_PROVIDER_SITE_OTHER): Payer: No Typology Code available for payment source | Admitting: Pharmacist

## 2016-12-23 ENCOUNTER — Encounter: Payer: Self-pay | Admitting: Pharmacist

## 2016-12-23 VITALS — BP 142/84 | HR 80 | Ht 65.0 in | Wt 206.0 lb

## 2016-12-23 DIAGNOSIS — E669 Obesity, unspecified: Secondary | ICD-10-CM

## 2016-12-23 DIAGNOSIS — Z794 Long term (current) use of insulin: Secondary | ICD-10-CM

## 2016-12-23 DIAGNOSIS — E1140 Type 2 diabetes mellitus with diabetic neuropathy, unspecified: Secondary | ICD-10-CM

## 2016-12-23 MED ORDER — ATORVASTATIN CALCIUM 10 MG PO TABS: 10.0000 mg | ORAL_TABLET | Freq: Every day | ORAL | 3 refills | Status: DC

## 2016-12-23 MED ORDER — INSULIN PEN NEEDLE 32G X 4 MM MISC: 0 refills | Status: DC

## 2016-12-23 MED ORDER — INSULIN GLARGINE 300 UNIT/ML ~~LOC~~ SOPN: 25.0000 [IU] | PEN_INJECTOR | Freq: Every day | SUBCUTANEOUS | 0 refills | Status: DC

## 2016-12-23 NOTE — Patient Instructions (Addendum)
Diabetes and Standards of Medical Care   Diabetes is complicated. You may find that your diabetes team includes a dietitian, nurse, diabetes educator, eye doctor, and more. To help everyone know what is going on and to help you get the care you deserve, the following schedule of care was developed to help keep you on track. Below are the tests, exams, vaccines, medicines, education, and plans you will need.  Blood Glucose Goals Prior to meals = 80 - 130 Within 2 hours of the start of a meal = less than 180  HbA1c test (goal is less than 7.0% - your last value was 7.9%) This test shows how well you have controlled your glucose over the past 2 to 3 months. It is used to see if your diabetes management plan needs to be adjusted.   It is performed at least 2 times a year if you are meeting treatment goals.  It is performed 4 times a year if therapy has changed or if you are not meeting treatment goals.  Blood pressure test  This test is performed at every routine medical visit. The goal is less than 140/90 mmHg for most people, but 130/80 mmHg in some cases. Ask your health care provider about your goal.  Dental exam  Follow up with the dentist regularly.  Eye exam  If you are diagnosed with type 1 diabetes as a child, get an exam upon reaching the age of 2 years or older and have had diabetes for 3 to 5 years. Yearly eye exams are recommended after that initial eye exam.  If you are diagnosed with type 1 diabetes as an adult, get an exam within 5 years of diagnosis and then yearly.  If you are diagnosed with type 2 diabetes, get an exam as soon as possible after the diagnosis and then yearly.  Foot care exam  Visual foot exams are performed at every routine medical visit. The exams check for cuts, injuries, or other problems with the feet.  A comprehensive foot exam should be done yearly. This includes visual inspection as well as assessing foot pulses and testing for loss of  sensation.  Check your feet nightly for cuts, injuries, or other problems with your feet. Tell your health care provider if anything is not healing.  Kidney function test (urine microalbumin)  This test is performed once a year.  Type 1 diabetes: The first test is performed 5 years after diagnosis.  Type 2 diabetes: The first test is performed at the time of diagnosis.  A serum creatinine and estimated glomerular filtration rate (eGFR) test is done once a year to assess the level of chronic kidney disease (CKD), if present.  Lipid profile (cholesterol, HDL, LDL, triglycerides)  Performed every 5 years for most people.  The goal for LDL is less than 100 mg/dL. If you are at high risk, the goal is less than 70 mg/dL. Your LDL was 117.  The goal for HDL is 40 mg/dL to 50 mg/dL for men and 50 mg/dL to 60 mg/dL for women. An HDL cholesterol of 60 mg/dL or higher gives some protection against heart disease. Your HDL was 46. Exercise will help with HDL (good cholesterol)  The goal for triglycerides is less than 150 mg/dL. Your triglycerides were 76.  Influenza vaccine, pneumococcal vaccine, and hepatitis B vaccine  The influenza vaccine is recommended yearly.  The pneumococcal vaccine is generally given once in a lifetime. However, there are some instances when another vaccination is recommended.  Check with your health care provider.  The hepatitis B vaccine is also recommended for adults with diabetes.  Diabetes self-management education  Education is recommended at diagnosis and ongoing as needed.  Treatment plan  Your treatment plan is reviewed at every medical visit.  Document Released: 07/18/2009 Document Revised: 05/23/2013 Document Reviewed: 02/20/2013 [facility] Patient Information 2014 [place].

## 2016-12-23 NOTE — Progress Notes (Addendum)
Subjective:    Caitlin Fritz is a 46 y.o. female who presents for an initial evaluation of Type 2 diabetes mellitus.   Caitlin Fritz is fairly new to our practice. Last year she did not have insurance and was going to the health department for services.  She is currently taking Lantus 25 units daily and metformin 1000mg  bid.  She had been prescribed Trulicity by Dr Warrick Parisian but it is not on her drug formulary.  He then switched to Victoza but it was not covered either.  Patient has emailed me her formulary and it appears that it is very limited and only contains mostly generic medications.  Current symptoms/problems include paresthesia of the feet and have been unchanged.   Self reported HBG range: 82 to 224 (highest occurred after she drank soda) She is checking BG qd to qod. Last A1c was 7.9% (12/01/2016)  Known diabetic complications: peripheral neuropathy and cerebrovascular disease Cardiovascular risk factors: advanced age (older than 50 for men, 13 for women), diabetes mellitus, dyslipidemia, hypertension and obesity (BMI >= 30 kg/m2)   Eye exam current (within one year): yes Weight trend: decreasing steadily Prior visit with CDE: no Current diet: in general, an "unhealthy" diet Current exercise: walking - 20 minutes every other day Medication Compliance?  Yes  Is She on ACE inhibitor or angiotensin II receptor blocker?  No   The following portions of the patient's history were reviewed and updated as appropriate: allergies, current medications, past family history, past medical history, past social history, past surgical history and problem list.    Objective:    There were no vitals taken for this visit.  Lab Review Glucose (mg/dL)  Date Value  12/01/2016 141 (H)   Glucose, Bld (mg/dL)  Date Value  07/08/2014 38 (LL)  11/07/2013 355 (H)  11/03/2013 267 (H)   CO2  Date Value  12/01/2016 21 mmol/L  07/08/2014 28 mEq/L  11/07/2013 24 mEq/L   BUN (mg/dL)  Date  Value  12/01/2016 9  07/08/2014 7  11/07/2013 11  11/03/2013 9   Creat (mg/dL)  Date Value  07/08/2014 0.59  11/07/2013 0.68  06/11/2013 0.60   Creatinine, Ser (mg/dL)  Date Value  12/01/2016 0.56 (L)  11/03/2013 0.58  11/02/2013 0.51   A1c = 7.9% (12/01/2016)    Assessment:    Diabetes Mellitus type II, under good control.   Obesity - weight is decreasing slowly Hyperlipidemia - goal LDL less than 70    Plan:    1.  Rx changes: I gave patient #2 samples for [place] to use until I can review her 2018 formulary to determine possible alternatives to Lantus.  start atorvastatin 10mg  daily 2.  Education: Reviewed 'ABCs' of diabetes management (respective goals in parentheses):  A1C (<7), blood pressure (<130/80), and cholesterol (LDL <100). 3. Discussed pathophysiology of DM; difference between type 1 and type 2 DM. 4. CHO counting diet discussed.  Reviewed CHO amount in various foods and how to read nutrition labels.  Discussed recommended serving sizes. Handout discussed and provided 5.  Recommend check BG 1  times a day 6.  Recommended increase physical activity - goal is 150 minutes per week 7. Follow up: 2 months

## 2016-12-24 LAB — MICROALBUMIN / CREATININE URINE RATIO
Creatinine, Urine: 86.5 mg/dL
MICROALBUM., U, RANDOM: 17.5 ug/mL
Microalb/Creat Ratio: 20.2 mg/g creat (ref 0.0–30.0)

## 2016-12-27 ENCOUNTER — Telehealth: Payer: Self-pay | Admitting: Family Medicine

## 2016-12-27 MED ORDER — SULFAMETHOXAZOLE-TRIMETHOPRIM 800-160 MG PO TABS: 1.0000 | ORAL_TABLET | Freq: Two times a day (BID) | ORAL | 0 refills | Status: DC

## 2016-12-27 NOTE — Telephone Encounter (Signed)
Patient aware.

## 2016-12-27 NOTE — Telephone Encounter (Signed)
-----   Message from Karle Plumber, [place] sent at 12/27/2016  8:14 AM EDT ----- Patient aware. Patient states that she has a new abscess that showed up under her right breast that showed up yesterday.  Patient states that she was told by Dr. Warrick Parisian for her to call if another one appeared so she could get an antibiotic. Patient would like antibiotic sent to [facility] in [place]. Please advise and send back to the pools.

## 2016-12-31 ENCOUNTER — Encounter: Payer: Self-pay | Admitting: Family Medicine

## 2017-01-03 NOTE — Telephone Encounter (Signed)
Left message on patient's VM to call the office for an appointment

## 2017-01-08 ENCOUNTER — Encounter: Payer: Self-pay | Admitting: Family Medicine

## 2017-01-08 MED ORDER — FLUCONAZOLE 150 MG PO TABS: 150.0000 mg | ORAL_TABLET | Freq: Once | ORAL | 0 refills | Status: AC

## 2017-01-13 ENCOUNTER — Encounter: Payer: Self-pay | Admitting: Family Medicine

## 2017-01-14 ENCOUNTER — Telehealth: Payer: Self-pay | Admitting: Pharmacist

## 2017-01-14 ENCOUNTER — Ambulatory Visit (INDEPENDENT_AMBULATORY_CARE_PROVIDER_SITE_OTHER): Payer: No Typology Code available for payment source | Admitting: Family Medicine

## 2017-01-14 ENCOUNTER — Encounter: Payer: Self-pay | Admitting: Family Medicine

## 2017-01-14 VITALS — BP 137/87 | HR 91 | Temp 99.8°F | Ht 65.0 in | Wt 204.0 lb

## 2017-01-14 DIAGNOSIS — E1140 Type 2 diabetes mellitus with diabetic neuropathy, unspecified: Secondary | ICD-10-CM | POA: Diagnosis not present

## 2017-01-14 DIAGNOSIS — G44209 Tension-type headache, unspecified, not intractable: Secondary | ICD-10-CM

## 2017-01-14 MED ORDER — INSULIN NPH (HUMAN) (ISOPHANE) 100 UNIT/ML ~~LOC~~ SUSP: 25.0000 [IU] | Freq: Every day | SUBCUTANEOUS | 2 refills | Status: DC

## 2017-01-14 MED ORDER — KETOROLAC TROMETHAMINE 60 MG/2ML IM SOLN
60.0000 mg | Freq: Once | INTRAMUSCULAR | Status: AC
  Administered 2017-01-14: 60 mg via INTRAMUSCULAR

## 2017-01-14 MED ORDER — CYCLOBENZAPRINE HCL 10 MG PO TABS: 10.0000 mg | ORAL_TABLET | Freq: Three times a day (TID) | ORAL | 0 refills | Status: DC | PRN

## 2017-01-14 MED ORDER — DULOXETINE HCL 60 MG PO CPEP: 60.0000 mg | ORAL_CAPSULE | Freq: Every day | ORAL | 2 refills | Status: DC

## 2017-01-14 NOTE — Progress Notes (Signed)
BP 137/87   Pulse 91   Temp 99.8 F (37.7 C) (Oral)   Ht 5\' 5"  (1.651 m)   Wt 204 lb (92.5 kg)   BMI 33.95 kg/m    Subjective:    Patient ID: Caitlin Fritz, female    DOB: October 16, 1970, 46 y.o.   MRN: [id number]  HPI: Caitlin Fritz is a 46 y.o. female presenting on 01/14/2017 for Headache (x 2 weeks, worse in last 3 days causing nausea and vomiting, sensitivity to light, waking her up during night hurting)   Headache   This is a new problem. Episode onset: one week ago. The problem occurs constantly. The problem has been gradually worsening. Pain location: top center of head. The pain radiates to the right shoulder. The pain quality is similar to prior headaches. The quality of the pain is described as aching and sharp. The pain is severe. Associated symptoms include dizziness, nausea, phonophobia, photophobia and vomiting. Pertinent negatives include no abdominal pain, coughing, eye watering, hearing loss, numbness, sinus pressure or weakness. Associated symptoms comments: Pain wakes the patient up at night. Patient states that yesterday she began having some cloudiness in the center of her vision after becoming upset with a customer; cloudiness lasted around thirty minutes; no other visual problems.. She has tried NSAIDs and cold packs for the symptoms. The treatment provided no relief. There is no history of recent head traumas. (Admits history of migraine over twenty years ago.)     Relevant past medical, surgical, family and social history reviewed and updated as indicated. Interim medical history since our last visit reviewed. Allergies and medications reviewed and updated.  Review of Systems  Constitutional:       Night sweats on Tuesday night  HENT: Negative for hearing loss and sinus pressure.   Eyes: Positive for photophobia.  Respiratory: Negative for cough and shortness of breath.   Cardiovascular: Negative for chest pain.  Gastrointestinal: Positive for nausea and  vomiting. Negative for abdominal pain.  Neurological: Positive for dizziness and headaches. Negative for weakness and numbness.   Per HPI unless specifically indicated above     Objective:    BP 137/87   Pulse 91   Temp 99.8 F (37.7 C) (Oral)   Ht 5\' 5"  (1.651 m)   Wt 204 lb (92.5 kg)   BMI 33.95 kg/m   Wt Readings from Last 3 Encounters:  01/14/17 204 lb (92.5 kg)  12/23/16 206 lb (93.4 kg)  12/10/16 207 lb 9.6 oz (94.2 kg)    Physical Exam  Constitutional: She is oriented to person, place, and time. She appears well-developed and well-nourished. No distress.  HENT:  Head: Normocephalic and atraumatic.  Eyes: Conjunctivae and EOM are normal. Pupils are equal, round, and reactive to light.  Neck: Normal range of motion. Neck supple.  Cardiovascular: Normal rate, regular rhythm, normal heart sounds and intact distal pulses.  Exam reveals no gallop and no friction rub.   No murmur heard. Pulmonary/Chest: Effort normal and breath sounds normal. No respiratory distress. She has no wheezes.  Abdominal: Soft. There is no tenderness.  Musculoskeletal: Normal range of motion.  Tenderness of paraspinal musculature from neck over to top of head.  Neurological: She is alert and oriented to person, place, and time. No cranial nerve deficit. Coordination normal.  Skin: Skin is warm and dry.  Psychiatric: She has a normal mood and affect. Her behavior is normal.        Assessment & Plan:   Problem List Items  Addressed This Visit      Endocrine   Diabetic neuropathy, painful ([place])    Other Visit Diagnoses    Tension headache    -  Primary   Relevant Medications   ketorolac (TORADOL) injection 60 mg (Completed)   cyclobenzaprine (FLEXERIL) 10 MG tablet   DULoxetine (CYMBALTA) 60 MG capsule       Follow up plan: Return if symptoms worsen or fail to improve.  Counseling provided for all of the vaccine components No orders of the defined types were placed in this  encounter.   Patient seen and examined with Nuala Alpha, PA-C. Agree with above assessment and plan, no changes oriented at this time.  Caryl Pina, MD [facility] 01/14/2017, 1:51 PM

## 2017-01-14 NOTE — Telephone Encounter (Signed)
Appointment scheduled.

## 2017-01-14 NOTE — Telephone Encounter (Signed)
Called patient after reviewing her formulary.   The only insulin covered is Humulin 50/50 mix, 70/30 mix, NPH or Regular.  I discussed switching Lantus 25 units qd to Humulin NPH.   Patient is going to try Humulin NPH 25 units qam.  She is instructed to monitor BG more closely and reviewed treatment of hypoglycmeia. As this could be more likely with NPH.  She also might need to split dose of NPH but for now will try 25 units qd.

## 2017-01-19 ENCOUNTER — Encounter: Payer: Self-pay | Admitting: Family Medicine

## 2017-01-19 ENCOUNTER — Ambulatory Visit (INDEPENDENT_AMBULATORY_CARE_PROVIDER_SITE_OTHER): Payer: No Typology Code available for payment source | Admitting: Family Medicine

## 2017-01-19 ENCOUNTER — Telehealth: Payer: Self-pay

## 2017-01-19 VITALS — BP 134/82 | HR 90 | Temp 98.5°F | Ht 65.0 in | Wt 204.0 lb

## 2017-01-19 DIAGNOSIS — G4452 New daily persistent headache (NDPH): Secondary | ICD-10-CM | POA: Diagnosis not present

## 2017-01-19 LAB — CBC WITH DIFFERENTIAL/PLATELET
Basophils Absolute: 0 10*3/uL (ref 0.0–0.2)
Basos: 0 %
EOS (ABSOLUTE): 0.1 10*3/uL (ref 0.0–0.4)
Eos: 2 %
HEMOGLOBIN: 12.5 g/dL (ref 11.1–15.9)
Hematocrit: 37.3 % (ref 34.0–46.6)
Lymphocytes Absolute: 2.8 10*3/uL (ref 0.7–3.1)
Lymphs: 35 %
MCH: 24.7 pg — ABNORMAL LOW (ref 26.6–33.0)
MCHC: 33.5 g/dL (ref 31.5–35.7)
MCV: 74 fL — ABNORMAL LOW (ref 79–97)
MONOS ABS: 0.3 10*3/uL (ref 0.1–0.9)
Monocytes: 4 %
NEUTROS PCT: 59 %
Neutrophils Absolute: 4.8 10*3/uL (ref 1.4–7.0)
PLATELETS: 281 10*3/uL (ref 150–379)
RBC: 5.06 x10E6/uL (ref 3.77–5.28)
RDW: 19.4 % — ABNORMAL HIGH (ref 12.3–15.4)
WBC: 8.1 10*3/uL (ref 3.4–10.8)

## 2017-01-19 MED ORDER — ONDANSETRON 4 MG PO TBDP: 4.0000 mg | ORAL_TABLET | Freq: Three times a day (TID) | ORAL | 0 refills | Status: DC | PRN

## 2017-01-19 MED ORDER — MELOXICAM 15 MG PO TABS: 15.0000 mg | ORAL_TABLET | Freq: Every day | ORAL | 0 refills | Status: DC

## 2017-01-19 MED ORDER — ALPRAZOLAM 1 MG PO TABS: 2.0000 mg | ORAL_TABLET | Freq: Once | ORAL | 0 refills | Status: AC

## 2017-01-19 NOTE — Telephone Encounter (Signed)
Alprazolam prescription called in to [facility] in [place].  Patient notified.  Advised not to drive herself if she takes the medication.

## 2017-01-19 NOTE — Progress Notes (Signed)
BP 134/82   Pulse 90   Temp 98.5 F (36.9 C) (Oral)   Ht 5\' 5"  (1.651 m)   Wt 204 lb (92.5 kg)   BMI 33.95 kg/m    Subjective:    Patient ID: Caitlin Fritz, female    DOB: 10-16-1970, 46 y.o.   MRN: [id number]  HPI: Caitlin Fritz is a 46 y.o. female presenting on 01/19/2017 for Headache, dizziness, vomiting (headache and dizziness has worsened, having pain in neck now radiating into right shoulder, low grade fever (highest 101), hurting in eyes, sharp stabbing pains in head, ringing in right ear; has been taking medication she was prescribed at previous visit)   HPI Headache and dizziness and vomiting Patient has been having headache and dizziness and vomiting that has been going on for over a week now. She has been trying Flexeril and heat compresses and does not feel like they're helping much. She has had a couple episodes of vomiting per day and she said she had a fever of 101 yesterday. She also has ringing in her right ear and pain in her right ear and feels like it needs to pop. She also has hurting in her eyes and a sharp stabbing pains in her head. She says the pain is 9 out of 10 and keeps her from functioning on a day-to-day basis. She says this is the worst headache of her life and has been worsening over the past week and a half. She is also been using the Flexeril at night which does help her sleep for a bit but then she wakes up again with headaches after the Flexeril wears off which is about 2 hours later.the headaches are throughout her head but are now m and frontal.  Relevant past medical, surgical, family and social history reviewed and updated as indicated. Interim medical history since our last visit reviewed. Allergies and medications reviewed and updated.  Review of Systems  Constitutional: Negative for chills and fever.  HENT: Negative for congestion, ear discharge and ear pain.   Eyes: Negative for redness and visual disturbance.  Respiratory: Negative for  chest tightness and shortness of breath.   Cardiovascular: Negative for chest pain and leg swelling.  Genitourinary: Negative for difficulty urinating and dysuria.  Musculoskeletal: Negative for back pain and gait problem.  Skin: Negative for rash.  Neurological: Positive for dizziness and headaches. Negative for tremors, speech difficulty, weakness and numbness.  Psychiatric/Behavioral: Negative for agitation and behavioral problems.  All other systems reviewed and are negative.   Per HPI unless specifically indicated above        Objective:    BP 134/82   Pulse 90   Temp 98.5 F (36.9 C) (Oral)   Ht 5\' 5"  (1.651 m)   Wt 204 lb (92.5 kg)   BMI 33.95 kg/m   Wt Readings from Last 3 Encounters:  01/19/17 204 lb (92.5 kg)  01/14/17 204 lb (92.5 kg)  12/23/16 206 lb (93.4 kg)    Physical Exam      Assessment & Plan:   Problem List Items Addressed This Visit    None    Visit Diagnoses    New daily persistent headache    -  Primary   Relevant Medications   meloxicam (MOBIC) 15 MG tablet   ondansetron (ZOFRAN ODT) 4 MG disintegrating tablet   Other Relevant Orders   MR Brain Wo Contrast   Aerobic Culture   Culture, blood (single) w Reflex to ID Panel   CBC  with Differential/Platelet       Follow up plan: Return if symptoms worsen or fail to improve.  Counseling provided for all of the vaccine components Orders Placed This Encounter  Procedures  . Aerobic Culture  . Culture, blood (single) w Reflex to ID Panel  . MR Brain Wo Contrast  . CBC with Differential/Platelet    Caryl Pina, MD [facility] 01/19/2017, 10:55 AM

## 2017-01-20 ENCOUNTER — Ambulatory Visit (HOSPITAL_COMMUNITY)
Admission: RE | Admit: 2017-01-20 | Discharge: 2017-01-20 | Disposition: A | Discharge status: Home / Self Care | Payer: No Typology Code available for payment source | Source: Ambulatory Visit | Attending: Family Medicine | Admitting: Family Medicine

## 2017-01-20 DIAGNOSIS — E119 Type 2 diabetes mellitus without complications: Secondary | ICD-10-CM | POA: Insufficient documentation

## 2017-01-20 DIAGNOSIS — G4452 New daily persistent headache (NDPH): Secondary | ICD-10-CM | POA: Insufficient documentation

## 2017-01-20 DIAGNOSIS — R9089 Other abnormal findings on diagnostic imaging of central nervous system: Secondary | ICD-10-CM | POA: Insufficient documentation

## 2017-01-20 DIAGNOSIS — I10 Essential (primary) hypertension: Secondary | ICD-10-CM | POA: Diagnosis not present

## 2017-01-21 ENCOUNTER — Other Ambulatory Visit: Payer: Self-pay | Admitting: *Deleted

## 2017-01-21 DIAGNOSIS — G44209 Tension-type headache, unspecified, not intractable: Secondary | ICD-10-CM

## 2017-01-21 DIAGNOSIS — M542 Cervicalgia: Secondary | ICD-10-CM

## 2017-01-25 LAB — CULTURE, BLOOD (SINGLE)

## 2017-03-01 ENCOUNTER — Encounter: Payer: Self-pay | Admitting: Family Medicine

## 2017-03-01 ENCOUNTER — Ambulatory Visit (INDEPENDENT_AMBULATORY_CARE_PROVIDER_SITE_OTHER): Payer: No Typology Code available for payment source | Admitting: Family Medicine

## 2017-03-01 VITALS — BP 138/87 | HR 92 | Temp 98.4°F | Ht 65.0 in | Wt 207.0 lb

## 2017-03-01 DIAGNOSIS — E785 Hyperlipidemia, unspecified: Secondary | ICD-10-CM

## 2017-03-01 DIAGNOSIS — N921 Excessive and frequent menstruation with irregular cycle: Secondary | ICD-10-CM

## 2017-03-01 DIAGNOSIS — E1142 Type 2 diabetes mellitus with diabetic polyneuropathy: Secondary | ICD-10-CM | POA: Diagnosis not present

## 2017-03-01 DIAGNOSIS — K2100 Gastro-esophageal reflux disease with esophagitis, without bleeding: Secondary | ICD-10-CM

## 2017-03-01 DIAGNOSIS — Z794 Long term (current) use of insulin: Secondary | ICD-10-CM | POA: Diagnosis not present

## 2017-03-01 DIAGNOSIS — G44209 Tension-type headache, unspecified, not intractable: Secondary | ICD-10-CM | POA: Diagnosis not present

## 2017-03-01 DIAGNOSIS — I152 Hypertension secondary to endocrine disorders: Secondary | ICD-10-CM

## 2017-03-01 DIAGNOSIS — Z114 Encounter for screening for human immunodeficiency virus [HIV]: Secondary | ICD-10-CM | POA: Diagnosis not present

## 2017-03-01 DIAGNOSIS — E669 Obesity, unspecified: Secondary | ICD-10-CM | POA: Diagnosis not present

## 2017-03-01 DIAGNOSIS — E1140 Type 2 diabetes mellitus with diabetic neuropathy, unspecified: Secondary | ICD-10-CM | POA: Diagnosis not present

## 2017-03-01 DIAGNOSIS — I10 Essential (primary) hypertension: Secondary | ICD-10-CM | POA: Diagnosis not present

## 2017-03-01 DIAGNOSIS — E1159 Type 2 diabetes mellitus with other circulatory complications: Secondary | ICD-10-CM | POA: Diagnosis not present

## 2017-03-01 LAB — BAYER DCA HB A1C WAIVED: HB A1C: 9.2 % — AB (ref <7.0)

## 2017-03-01 MED ORDER — MELOXICAM 15 MG PO TABS: 15.0000 mg | ORAL_TABLET | Freq: Every day | ORAL | 2 refills | Status: DC

## 2017-03-01 MED ORDER — METFORMIN HCL 1000 MG PO TABS: 1000.0000 mg | ORAL_TABLET | Freq: Two times a day (BID) | ORAL | 2 refills | Status: DC

## 2017-03-01 MED ORDER — OMEPRAZOLE 20 MG PO CPDR: 20.0000 mg | DELAYED_RELEASE_CAPSULE | Freq: Every day | ORAL | 3 refills | Status: DC

## 2017-03-01 MED ORDER — CYCLOBENZAPRINE HCL 10 MG PO TABS: 10.0000 mg | ORAL_TABLET | Freq: Three times a day (TID) | ORAL | 2 refills | Status: DC | PRN

## 2017-03-01 NOTE — Progress Notes (Signed)
 BP 138/87   Pulse 92   Temp 98.4 F (36.9 C) (Oral)   Ht 5' 5" (1.651 m)   Wt 207 lb (93.9 kg)   BMI 34.45 kg/m     Subjective:    Patient ID: Caitlin Fritz, female    DOB: 09/02/1971, 45 y.o.   MRN: [id number]  HPI: Caitlin Fritz is a 45 y.o. female presenting on 03/01/2017 for Diabetes (3 mo, patient is fasting); Hypertension; Hyperlipidemia; and Discuss menstrual  cycle (has had 2 this month, 5/13 and 5/27)   HPI Type 2 diabetes mellitus Patient comes in today for recheck of his diabetes. Patient has been currently taking Metformin and insulin NPH, we have been having a lot of trouble with her insurance and trying to get other types of medications. Patient is not currently on an ACE inhibitor.Patient has not seen an ophthalmologist this year. Patient denies any new issues with his feet, she is still having a lot of issues with neuropathy and pain associated with the medication for see if helps She was taking Cymbalta 30 and does not think like it was making a big difference.  Hypertension Patient is currently on Amlodipine, and her blood pressure today is 138/87. Patient denies any lightheadedness or dizziness. Patient denies headaches, blurred vision, chest pains, shortness of breath, or weakness. Denies any side effects from medication and is content with current medication.   Hyperlipidemia Patient is coming in for recheck of his hyperlipidemia. He is currently taking Lipitor. He denies any issues with myalgias or history of liver damage from it. He denies any focal numbness or weakness or chest pain.    Refill on medications for headaches and GERD  Irregular and heavy periods that are coming twice a month  Patient comes in with complaints of irregular and heavy periods that are coming twice a month. She's been having issues with this over the past couple months. Prior to that she was having regular periods that were a little bit heavy but every month. She does say that her  mother with menopausal at age around where she is currently. She denies any hot flashes or vaginal discharge or pain. She denies any abdominal pain but does have cramping when she is having her periods. She denies any fevers or chills or flank pain. She was just concerned about the having 2 periods a month over the past couple months and was wondering if anything needed to be done about that. We discussed possibly going to see a gynecologist or waiting to see if it was just menopause starting and she opted to go ahead and wait and we will see where he goes from there. We will check her blood counts and see where she is. Denies any lightheadedness or chest pain.  Relevant past medical, surgical, family and social history reviewed and updated as indicated. Interim medical history since our last visit reviewed. Allergies and medications reviewed and updated.  Review of Systems  Constitutional: Negative for chills and fever.  HENT: Negative for congestion, ear discharge and ear pain.   Eyes: Negative for redness and visual disturbance.  Respiratory: Negative for chest tightness and shortness of breath.   Cardiovascular: Negative for chest pain and leg swelling.  Genitourinary: Positive for menstrual problem and vaginal bleeding. Negative for difficulty urinating and dysuria.  Musculoskeletal: Negative for back pain and gait problem.  Skin: Negative for rash.  Neurological: Negative for light-headedness and headaches.  Psychiatric/Behavioral: Negative for agitation and behavioral problems.  All other systems   reviewed and are negative.   Per HPI unless specifically indicated above     Objective:    BP 138/87   Pulse 92   Temp 98.4 F (36.9 C) (Oral)   Ht 5' 5" (1.651 m)   Wt 207 lb (93.9 kg)   BMI 34.45 kg/m    Wt Readings from Last 3 Encounters:  03/01/17 207 lb (93.9 kg)  01/19/17 204 lb (92.5 kg)  01/14/17 204 lb (92.5 kg)    Physical Exam  Constitutional: She is oriented to  person, place, and time. She appears well-developed and well-nourished. No distress.  Eyes: Conjunctivae are normal.  Neck: Neck supple. No thyromegaly present.  Cardiovascular: Normal rate, regular rhythm, normal heart sounds and intact distal pulses.   No murmur heard. Pulmonary/Chest: Effort normal and breath sounds normal. No respiratory distress. She has no wheezes. She has no rales.  Abdominal: Soft. Bowel sounds are normal. She exhibits no distension. There is no tenderness. There is no rebound and no guarding.  Musculoskeletal: Normal range of motion.  Lymphadenopathy:    She has no cervical adenopathy.  Neurological: She is alert and oriented to person, place, and time. Coordination normal.  Skin: Skin is warm and dry. No rash noted. She is not diaphoretic.  Psychiatric: She has a normal mood and affect. Her behavior is normal.  Nursing note and vitals reviewed.      Assessment & Plan:   Problem List Items Addressed This Visit      Cardiovascular and Mediastinum   Hypertension associated with diabetes ([place])   Relevant Medications   metFORMIN (GLUCOPHAGE) 1000 MG tablet   Other Relevant Orders   CMP14+EGFR (Completed)     Endocrine   DM2 (diabetes mellitus, type 2) (HCC) (Chronic)   Relevant Medications   metFORMIN (GLUCOPHAGE) 1000 MG tablet   Other Relevant Orders   Bayer DCA Hb A1c Waived (Completed)   CMP14+EGFR (Completed)   Bayer DCA Hb A1c Waived (Completed)   CMP14+EGFR (Completed)   Diabetic neuropathy, painful (HCC)   Relevant Medications   metFORMIN (GLUCOPHAGE) 1000 MG tablet   Other Relevant Orders   Bayer DCA Hb A1c Waived (Completed)   CMP14+EGFR (Completed)   Type 2 diabetes mellitus with diabetic neuropathy, unspecified (HCC) - Primary   Relevant Medications   metFORMIN (GLUCOPHAGE) 1000 MG tablet   Other Relevant Orders   Bayer DCA Hb A1c Waived (Completed)   CMP14+EGFR (Completed)     Other   Dyslipidemia   Relevant Orders   Lipid panel  (Completed)   Obesity (BMI 30.0-34.9)   Relevant Medications   metFORMIN (GLUCOPHAGE) 1000 MG tablet    Other Visit Diagnoses    Menorrhagia with irregular cycle       Relevant Orders   CBC with Differential/Platelet (Completed)   Screening for HIV without presence of risk factors       Relevant Orders   HIV antibody (Completed)   Gastroesophageal reflux disease with esophagitis       Relevant Medications   omeprazole (PRILOSEC) 20 MG capsule   Tension headache       Relevant Medications   cyclobenzaprine (FLEXERIL) 10 MG tablet   meloxicam (MOBIC) 15 MG tablet       Follow up plan: Return in about 3 months (around 06/01/2017), or if symptoms worsen or fail to improve, for recheck diabetes.  Counseling provided for all of the vaccine components Orders Placed This Encounter  Procedures  . Bayer DCA Hb A1c Waived  . CMP14+EGFR  .  Lipid panel  . HIV antibody  . CBC with Differential/Platelet    Caryl Pina, MD [facility] 03/01/2017, 9:54 AM

## 2017-03-02 ENCOUNTER — Encounter: Payer: Self-pay | Admitting: Family Medicine

## 2017-03-02 MED ORDER — FLUTICASONE PROPIONATE 50 MCG/ACT NA SUSP: 1.0000 | Freq: Two times a day (BID) | NASAL | 6 refills | Status: DC | PRN

## 2017-03-02 MED ORDER — ALBUTEROL SULFATE HFA 108 (90 BASE) MCG/ACT IN AERS: 2.0000 | INHALATION_SPRAY | Freq: Four times a day (QID) | RESPIRATORY_TRACT | 0 refills | Status: DC | PRN

## 2017-03-03 ENCOUNTER — Telehealth: Payer: Self-pay | Admitting: Family Medicine

## 2017-03-03 LAB — CMP14+EGFR
A/G RATIO: 1.3 (ref 1.2–2.2)
ALK PHOS: 113 IU/L (ref 39–117)
ALT: 20 IU/L (ref 0–32)
AST: 16 IU/L (ref 0–40)
Albumin: 3.7 g/dL (ref 3.5–5.5)
BUN/Creatinine Ratio: 13 (ref 9–23)
BUN: 7 mg/dL (ref 6–24)
CHLORIDE: 101 mmol/L (ref 96–106)
CO2: 23 mmol/L (ref 18–29)
Calcium: 9.1 mg/dL (ref 8.7–10.2)
Creatinine, Ser: 0.56 mg/dL — ABNORMAL LOW (ref 0.57–1.00)
GFR calc Af Amer: 130 mL/min/{1.73_m2} (ref >59)
GFR calc non Af Amer: 113 mL/min/{1.73_m2} (ref >59)
GLUCOSE: 242 mg/dL — AB (ref 65–99)
Globulin, Total: 2.9 g/dL (ref 1.5–4.5)
POTASSIUM: 4.3 mmol/L (ref 3.5–5.2)
Sodium: 138 mmol/L (ref 134–144)
Total Protein: 6.6 g/dL (ref 6.0–8.5)

## 2017-03-03 LAB — CBC WITH DIFFERENTIAL/PLATELET
BASOS ABS: 0 10*3/uL (ref 0.0–0.2)
Basos: 0 %
EOS (ABSOLUTE): 0.2 10*3/uL (ref 0.0–0.4)
Eos: 3 %
HEMOGLOBIN: 12.6 g/dL (ref 11.1–15.9)
Hematocrit: 39.4 % (ref 34.0–46.6)
Immature Grans (Abs): 0 10*3/uL (ref 0.0–0.1)
Immature Granulocytes: 0 %
Lymphocytes Absolute: 1.9 10*3/uL (ref 0.7–3.1)
Lymphs: 30 %
MCH: 25.8 pg — AB (ref 26.6–33.0)
MCHC: 32 g/dL (ref 31.5–35.7)
MCV: 81 fL (ref 79–97)
Monocytes Absolute: 0.4 10*3/uL (ref 0.1–0.9)
Monocytes: 6 %
NEUTROS ABS: 3.7 10*3/uL (ref 1.4–7.0)
Neutrophils: 61 %
Platelets: 244 10*3/uL (ref 150–379)
RBC: 4.89 x10E6/uL (ref 3.77–5.28)
RDW: 19 % — ABNORMAL HIGH (ref 12.3–15.4)
WBC: 6.2 10*3/uL (ref 3.4–10.8)

## 2017-03-03 LAB — LIPID PANEL
CHOL/HDL RATIO: 3.1 ratio (ref 0.0–4.4)
Cholesterol, Total: 115 mg/dL (ref 100–199)
HDL: 37 mg/dL — AB (ref >39)
LDL Calculated: 64 mg/dL (ref 0–99)
Triglycerides: 70 mg/dL (ref 0–149)
VLDL Cholesterol Cal: 14 mg/dL (ref 5–40)

## 2017-03-03 LAB — HIV ANTIBODY (ROUTINE TESTING W REFLEX): HIV SCREEN 4TH GENERATION: NONREACTIVE

## 2017-03-03 NOTE — Telephone Encounter (Signed)
Spoke with pt and she is coughing like crazy. She understands the albuterol isn't generic. She scheduled appt with Dr Dettinger tomorrow at 9:25.

## 2017-03-03 NOTE — Telephone Encounter (Signed)
There are no cheaper inhalers that is the cheapest. Unfortunately that is just something we run into, there are no generics albuterol is the only one, unfortunately they are all around $50-$60

## 2017-03-04 ENCOUNTER — Encounter: Payer: Self-pay | Admitting: Family Medicine

## 2017-03-04 ENCOUNTER — Ambulatory Visit (INDEPENDENT_AMBULATORY_CARE_PROVIDER_SITE_OTHER): Payer: No Typology Code available for payment source | Admitting: Family Medicine

## 2017-03-04 VITALS — BP 130/84 | HR 103 | Temp 99.7°F | Ht 65.0 in | Wt 206.0 lb

## 2017-03-04 DIAGNOSIS — J209 Acute bronchitis, unspecified: Secondary | ICD-10-CM

## 2017-03-04 MED ORDER — METHYLPREDNISOLONE ACETATE 80 MG/ML IJ SUSP
80.0000 mg | Freq: Once | INTRAMUSCULAR | Status: AC
  Administered 2017-03-04: 80 mg via INTRAMUSCULAR

## 2017-03-04 MED ORDER — CEFDINIR 300 MG PO CAPS: 300.0000 mg | ORAL_CAPSULE | Freq: Two times a day (BID) | ORAL | 0 refills | Status: DC

## 2017-03-04 NOTE — Progress Notes (Signed)
BP 130/84   Pulse (!) 103   Temp 99.7 F (37.6 C) (Oral)   Ht 5\' 5"  (1.651 m)   Wt 206 lb (93.4 kg)   SpO2 99%   BMI 34.28 kg/m    Subjective:    Patient ID: Caitlin Fritz, female    DOB: Oct 20, 1970, 46 y.o.   MRN: [id number]  HPI: Caitlin Fritz is a 46 y.o. female presenting on 03/04/2017 for Cough; Wheezing, shortness of breath; Chest congestion and burning; and Fever (102.0 last night)   HPI Cough and congestion and wheezing and shortness of breath Patient has been having cough and congestion and wheezing and shortness of breath that has been increasing over the past few days. She had a fever of 102 last night. She feels like she's been feeling chest tightness and shortness of breath. She has been using her Flonase and Tylenol and Motrin which have helped with the fever and some of her nasal congestion and drainage but have not gotten rid of the rest of what is going on. She denies any sick contacts that she knows of. Her cough is productive of clear sputum. She started to have pain in her ribs from all the coughing and the coughing is keeping her up at night as well.  Relevant past medical, surgical, family and social history reviewed and updated as indicated. Interim medical history since our last visit reviewed. Allergies and medications reviewed and updated.  Review of Systems  Constitutional: Positive for chills and fever.  HENT: Positive for congestion, postnasal drip, rhinorrhea, sinus pressure, sneezing and sore throat. Negative for ear discharge and ear pain.   Eyes: Negative for pain, redness and visual disturbance.  Respiratory: Positive for cough, shortness of breath and wheezing. Negative for chest tightness.   Cardiovascular: Negative for chest pain and leg swelling.  Genitourinary: Negative for difficulty urinating and dysuria.  Musculoskeletal: Negative for back pain and gait problem.  Skin: Negative for rash.  Neurological: Negative for light-headedness and  headaches.  Psychiatric/Behavioral: Negative for agitation and behavioral problems.  All other systems reviewed and are negative.   Per HPI unless specifically indicated above        Objective:    BP 130/84   Pulse (!) 103   Temp 99.7 F (37.6 C) (Oral)   Ht 5\' 5"  (1.651 m)   Wt 206 lb (93.4 kg)   SpO2 99%   BMI 34.28 kg/m   Wt Readings from Last 3 Encounters:  03/04/17 206 lb (93.4 kg)  03/01/17 207 lb (93.9 kg)  01/19/17 204 lb (92.5 kg)    Physical Exam  Constitutional: She is oriented to person, place, and time. She appears well-developed and well-nourished. No distress.  HENT:  Right Ear: Tympanic membrane, external ear and ear canal normal.  Left Ear: Tympanic membrane, external ear and ear canal normal.  Nose: Mucosal edema and rhinorrhea present. No epistaxis. Right sinus exhibits no maxillary sinus tenderness and no frontal sinus tenderness. Left sinus exhibits no maxillary sinus tenderness and no frontal sinus tenderness.  Mouth/Throat: Uvula is midline and mucous membranes are normal. Posterior oropharyngeal edema and posterior oropharyngeal erythema present. No oropharyngeal exudate or tonsillar abscesses.  Eyes: Conjunctivae are normal.  Neck: Neck supple. No thyromegaly present.  Cardiovascular: Normal rate, regular rhythm, normal heart sounds and intact distal pulses.   No murmur heard. Pulmonary/Chest: Effort normal and breath sounds normal. No respiratory distress. She has no wheezes (No wheezes noted on exam). She has no rales.  Musculoskeletal:  Normal range of motion.  Lymphadenopathy:    She has no cervical adenopathy.  Neurological: She is alert and oriented to person, place, and time. Coordination normal.  Skin: Skin is warm and dry. No rash noted. She is not diaphoretic.  Psychiatric: She has a normal mood and affect. Her behavior is normal.  Vitals reviewed.     Assessment & Plan:   Problem List Items Addressed This Visit    None    Visit  Diagnoses    Acute bronchitis, unspecified organism    -  Primary   Relevant Medications   cefdinir (OMNICEF) 300 MG capsule   methylPREDNISolone acetate (DEPO-MEDROL) injection 80 mg (Start on 03/04/2017 10:15 AM)       Follow up plan: Return if symptoms worsen or fail to improve.  Counseling provided for all of the vaccine components No orders of the defined types were placed in this encounter.   Caryl Pina, MD [facility] 03/04/2017, 10:01 AM

## 2017-04-01 ENCOUNTER — Encounter: Payer: Self-pay | Admitting: Family Medicine

## 2017-04-04 ENCOUNTER — Encounter: Payer: Self-pay | Admitting: Family Medicine

## 2017-04-04 ENCOUNTER — Ambulatory Visit (INDEPENDENT_AMBULATORY_CARE_PROVIDER_SITE_OTHER): Payer: No Typology Code available for payment source | Admitting: Family Medicine

## 2017-04-04 DIAGNOSIS — M109 Gout, unspecified: Secondary | ICD-10-CM

## 2017-04-04 MED ORDER — COLCHICINE 0.6 MG PO TABS: ORAL_TABLET | ORAL | 2 refills | Status: DC

## 2017-04-04 NOTE — Progress Notes (Signed)
Chief Complaint  Patient presents with  . Foot Swelling    left, no injury, 1 week    HPI  Patient presents today for 5 days of swelling and increasing pain in the left forefoot. Also noted some darkening like it's bruising. Pain shoots up her leg. At times she has a sensation of needles and then some numbness intermittently. She is a diabetic and has a known neuropathy but this is different. Previously no swelling or pain other than the pre-existing tingling/needles.  PMH: Smoking status noted ROS: Per HPI  Objective: There were no vitals taken for this visit. Gen: NAD, alert, cooperative with exam HEENT: NCAT, EOMI, PERRL CV: RRR, good S1/S2, no murmur Resp: CTABL, no wheezes, non-labored Abd: SNTND, BS present, no guarding or organomegaly Ext: Moderately severe tenderness at the base of the toes on the forefoot of the left dorsal foot. Painful for passive and active flexion and extension of the toes at the IP joints. Neuro: Alert and oriented, No gross deficits  Assessment and plan:  1. Acute gout of left foot, unspecified cause     Meds ordered this encounter  Medications  . colchicine 0.6 MG tablet    Sig: Take twice daily for gout attack. (may take every two hours up to 6 doses at acute onset)    Dispense:  60 tablet    Refill:  2    Orders Placed This Encounter  Procedures  . CBC with Differential/Platelet  . CMP14+EGFR    Order Specific Question:   Has the patient fasted?    Answer:   Yes  . Uric acid    Follow up as needed.  Warren Stacks, MD     

## 2017-04-04 NOTE — Telephone Encounter (Signed)
Could be gout or infection or even a tendonitis. Needs office visit. Thanks,  WS

## 2017-04-05 ENCOUNTER — Encounter: Payer: Self-pay | Admitting: Family Medicine

## 2017-04-05 ENCOUNTER — Other Ambulatory Visit: Payer: Self-pay | Admitting: Family Medicine

## 2017-04-05 MED ORDER — INDOMETHACIN 25 MG PO CAPS: 25.0000 mg | ORAL_CAPSULE | Freq: Three times a day (TID) | ORAL | 2 refills | Status: DC

## 2017-04-06 LAB — CMP14+EGFR
A/G RATIO: 1.1 — AB (ref 1.2–2.2)
ALK PHOS: 141 IU/L — AB (ref 39–117)
ALT: 19 IU/L (ref 0–32)
AST: 17 IU/L (ref 0–40)
Albumin: 3.6 g/dL (ref 3.5–5.5)
BUN/Creatinine Ratio: 11 (ref 9–23)
BUN: 10 mg/dL (ref 6–24)
Bilirubin Total: <0.2 mg/dL (ref 0.0–1.2)
CALCIUM: 10.3 mg/dL — AB (ref 8.7–10.2)
CO2: 24 mmol/L (ref 20–29)
Chloride: 97 mmol/L (ref 96–106)
Creatinine, Ser: 0.93 mg/dL (ref 0.57–1.00)
GFR calc Af Amer: 86 mL/min/{1.73_m2} (ref >59)
GFR calc non Af Amer: 74 mL/min/{1.73_m2} (ref >59)
GLOBULIN, TOTAL: 3.4 g/dL (ref 1.5–4.5)
Glucose: 294 mg/dL — ABNORMAL HIGH (ref 65–99)
POTASSIUM: 5.5 mmol/L — AB (ref 3.5–5.2)
SODIUM: 138 mmol/L (ref 134–144)
Total Protein: 7 g/dL (ref 6.0–8.5)

## 2017-04-06 LAB — CBC WITH DIFFERENTIAL/PLATELET
Basophils Absolute: 0 10*3/uL (ref 0.0–0.2)
Basos: 0 %
EOS (ABSOLUTE): 0.2 10*3/uL (ref 0.0–0.4)
EOS: 2 %
HEMATOCRIT: 38 % (ref 34.0–46.6)
Hemoglobin: 12.5 g/dL (ref 11.1–15.9)
IMMATURE GRANULOCYTES: 1 %
Immature Grans (Abs): 0 10*3/uL (ref 0.0–0.1)
LYMPHS ABS: 3.3 10*3/uL — AB (ref 0.7–3.1)
Lymphs: 45 %
MCH: 26.4 pg — ABNORMAL LOW (ref 26.6–33.0)
MCHC: 32.9 g/dL (ref 31.5–35.7)
MCV: 80 fL (ref 79–97)
MONOS ABS: 0.5 10*3/uL (ref 0.1–0.9)
Monocytes: 6 %
NEUTROS PCT: 46 %
Neutrophils Absolute: 3.4 10*3/uL (ref 1.4–7.0)
Platelets: 307 10*3/uL (ref 150–379)
RBC: 4.73 x10E6/uL (ref 3.77–5.28)
RDW: 15.9 % — AB (ref 12.3–15.4)
WBC: 7.3 10*3/uL (ref 3.4–10.8)

## 2017-04-06 LAB — URIC ACID: URIC ACID: 3.8 mg/dL (ref 2.5–7.1)

## 2017-04-07 ENCOUNTER — Other Ambulatory Visit: Payer: Self-pay

## 2017-04-07 MED ORDER — INDOMETHACIN 25 MG PO CAPS: 25.0000 mg | ORAL_CAPSULE | Freq: Three times a day (TID) | ORAL | 2 refills | Status: DC

## 2017-04-13 ENCOUNTER — Other Ambulatory Visit: Payer: Self-pay | Admitting: Family Medicine

## 2017-04-13 DIAGNOSIS — R921 Mammographic calcification found on diagnostic imaging of breast: Secondary | ICD-10-CM

## 2017-04-19 ENCOUNTER — Ambulatory Visit (HOSPITAL_COMMUNITY)
Admission: RE | Admit: 2017-04-19 | Discharge: 2017-04-19 | Disposition: A | Discharge status: Home / Self Care | Payer: No Typology Code available for payment source | Source: Ambulatory Visit | Attending: Family Medicine | Admitting: Family Medicine

## 2017-04-19 DIAGNOSIS — R921 Mammographic calcification found on diagnostic imaging of breast: Secondary | ICD-10-CM | POA: Insufficient documentation

## 2017-04-20 ENCOUNTER — Encounter: Payer: Self-pay | Admitting: Family Medicine

## 2017-04-22 ENCOUNTER — Ambulatory Visit (INDEPENDENT_AMBULATORY_CARE_PROVIDER_SITE_OTHER): Payer: No Typology Code available for payment source | Admitting: Family Medicine

## 2017-04-22 ENCOUNTER — Encounter: Payer: Self-pay | Admitting: Family Medicine

## 2017-04-22 VITALS — BP 135/89 | HR 103 | Temp 98.0°F | Ht 65.0 in | Wt 207.0 lb

## 2017-04-22 DIAGNOSIS — M79662 Pain in left lower leg: Secondary | ICD-10-CM | POA: Diagnosis not present

## 2017-04-22 DIAGNOSIS — M79672 Pain in left foot: Secondary | ICD-10-CM

## 2017-04-22 MED ORDER — VITAMIN D (ERGOCALCIFEROL) 1.25 MG (50000 UNIT) PO CAPS: 50000.0000 [IU] | ORAL_CAPSULE | ORAL | 1 refills | Status: DC

## 2017-04-22 NOTE — Progress Notes (Signed)
BP 135/89   Pulse (!) 103   Temp 98 F (36.7 C) (Oral)   Ht 5\' 5"  (1.651 m)   Wt 207 lb (93.9 kg)   LMP 03/29/2017   BMI 34.45 kg/m    Subjective:    Patient ID: Caitlin Fritz, female    DOB: 06/10/1971, 46 y.o.   MRN: [id number]  HPI: Caitlin Fritz is a 46 y.o. female presenting on 04/22/2017 for Pain and swelling in left foot (saw Dr. Livia Snellen on 04/04/17, diagnosed with gout and given indomethacin, labs came back and it was not gout)   HPI Left foot pain and calf pain and swelling Patient comes in complaining of continued left foot pain and calf pain and swelling. She was seen by one of my colleagues for this and had some lab testing to see if she had gout which came back with a normal uric acid level. She has a lot of swelling on top of her left foot and then going up into her calf as well. She has posterior calf pain. She denies any difficulty ambulating. She says this has been going on for at least a few weeks now. She has been taking anti-inflammatories that were prescribed for gout which helped a little but have not been helping significantly.  Relevant past medical, surgical, family and social history reviewed and updated as indicated. Interim medical history since our last visit reviewed. Allergies and medications reviewed and updated.  Review of Systems  Constitutional: Negative for chills and fever.  Respiratory: Negative for chest tightness and shortness of breath.   Cardiovascular: Positive for leg swelling. Negative for chest pain.  Musculoskeletal: Positive for arthralgias. Negative for back pain and gait problem.  Skin: Negative for color change and rash.  Neurological: Negative for light-headedness and headaches.  Psychiatric/Behavioral: Negative for agitation and behavioral problems.  All other systems reviewed and are negative.   Per HPI unless specifically indicated above        Objective:    BP 135/89   Pulse (!) 103   Temp 98 F (36.7 C) (Oral)    Ht 5\' 5"  (1.651 m)   Wt 207 lb (93.9 kg)   LMP 03/29/2017   BMI 34.45 kg/m   Wt Readings from Last 3 Encounters:  04/22/17 207 lb (93.9 kg)  03/04/17 206 lb (93.4 kg)  03/01/17 207 lb (93.9 kg)    Physical Exam  Constitutional: She is oriented to person, place, and time. She appears well-developed and well-nourished. No distress.  Eyes: Conjunctivae are normal.  Cardiovascular: Normal rate, regular rhythm, normal heart sounds and intact distal pulses.   No murmur heard. Pulmonary/Chest: Effort normal and breath sounds normal. No respiratory distress. She has no wheezes.  Musculoskeletal: Normal range of motion. She exhibits edema (2+ edema and left foot and calf. Tenderness over calf and foot were swelling is.) and tenderness.  Neurological: She is alert and oriented to person, place, and time. Coordination normal.  Skin: Skin is warm and dry. No rash noted. She is not diaphoretic.  Psychiatric: She has a normal mood and affect. Her behavior is normal.  Nursing note and vitals reviewed.       Assessment & Plan:   Problem List Items Addressed This Visit    None    Visit Diagnoses    Pain of left calf    -  Primary   Relevant Orders   DG Foot Complete Left (Completed)   US Venous Img Lower Unilateral Left (Completed)  Left foot pain       Relevant Orders   DG Foot Complete Left (Completed)   US Venous Img Lower Unilateral Left (Completed)      Unable to get scheduled because it is after hours, will schedule on Monday for ultrasound and x-ray.  Follow up plan: Return if symptoms worsen or fail to improve.  Counseling provided for all of the vaccine components Orders Placed This Encounter  Procedures  . DG Foot Complete Left  . US Venous Img Lower Unilateral Left    Caryl Pina, MD [facility] 04/22/2017, 5:06 PM

## 2017-04-25 ENCOUNTER — Ambulatory Visit (HOSPITAL_COMMUNITY)
Admission: RE | Admit: 2017-04-25 | Discharge: 2017-04-25 | Disposition: A | Discharge status: Home / Self Care | Payer: PRIVATE HEALTH INSURANCE | Source: Ambulatory Visit | Attending: Family Medicine | Admitting: Family Medicine

## 2017-04-25 DIAGNOSIS — M79662 Pain in left lower leg: Secondary | ICD-10-CM

## 2017-04-25 DIAGNOSIS — M79672 Pain in left foot: Secondary | ICD-10-CM | POA: Insufficient documentation

## 2017-04-26 ENCOUNTER — Telehealth: Payer: Self-pay | Admitting: *Deleted

## 2017-04-26 ENCOUNTER — Ambulatory Visit: Payer: No Typology Code available for payment source | Admitting: Family Medicine

## 2017-04-26 NOTE — Telephone Encounter (Signed)
Patient should wear compression stockings and keep elevated as much as she can. If she needs a prescription for compression stockings that we can write for 1. 20-30 mmHg pressure, diagnosis lower extremity edema

## 2017-04-26 NOTE — Telephone Encounter (Signed)
Since Ultrasound was negative patient would like to know what to do next for the swelling and the pain?

## 2017-04-26 NOTE — Telephone Encounter (Signed)
Patient aware of recommendations.  

## 2017-05-02 ENCOUNTER — Encounter: Payer: Self-pay | Admitting: Family Medicine

## 2017-05-04 ENCOUNTER — Encounter: Payer: Self-pay | Admitting: Family Medicine

## 2017-05-04 ENCOUNTER — Ambulatory Visit (INDEPENDENT_AMBULATORY_CARE_PROVIDER_SITE_OTHER): Payer: No Typology Code available for payment source | Admitting: Family Medicine

## 2017-05-04 VITALS — BP 136/88 | HR 92 | Temp 99.4°F | Ht 65.0 in | Wt 209.0 lb

## 2017-05-04 DIAGNOSIS — M545 Low back pain, unspecified: Secondary | ICD-10-CM

## 2017-05-04 DIAGNOSIS — M79672 Pain in left foot: Secondary | ICD-10-CM

## 2017-05-04 MED ORDER — CYCLOBENZAPRINE HCL 10 MG PO TABS: 10.0000 mg | ORAL_TABLET | Freq: Three times a day (TID) | ORAL | 3 refills | Status: DC | PRN

## 2017-05-04 NOTE — Progress Notes (Signed)
BP 136/88   Pulse 92   Temp 99.4 F (37.4 C) (Oral)   Ht 5\' 5"  (1.651 m)   Wt 209 lb (94.8 kg)   BMI 34.78 kg/m    Subjective:    Patient ID: Caitlin Fritz, female    DOB: 10-27-1970, 46 y.o.   MRN: [id number]  HPI: Caitlin Fritz is a 46 y.o. female presenting on 05/04/2017 for Left foot pain and swelling (wearing compression hose every day and elevating as directed, has not improved) and Back Pain (pain and spasms)   HPI Left foot pain, continued Patient comes in today complaining of continued left foot pain. She was initially seen for this issue on 04/04/2017 and has continued to be an issue since that time she still feels like she has swelling on the top of that foot. The last time she was seen here with that she was sent for an ultrasound to rule out DVT and it x-ray to rule out possible fracture and both came back negative. She was treated like it was gout initially which did not seem to improve it and we tried to treat it with compression stocking to see if we can reduce the swelling and that would improve it but it has not. She says it still is very painful and hurts her to walk and wear shoes on it as well. The pain is mostly located on the top of her left foot in about the middle of her foot. She still is swollen on the top of that foot as well. She is not having as much pain going in the calf or swelling going up in the calf that she was previously. She denies any fevers or chills or redness or warmth.  Low back pain Patient complains of left low back pain that has started up over the past half a day, when she woke this morning she was having spasms in her back and feels like there is a knot in her left lower back that has not been there before. She has not taken anything for it yet today but wanted to come in to be evaluated. She denies any fevers or chills or redness or warmth. She denies any numbness or weakness going down either of her legs. She denies any shooting pain going  anywhere else.  Relevant past medical, surgical, family and social history reviewed and updated as indicated. Interim medical history since our last visit reviewed. Allergies and medications reviewed and updated.  Review of Systems  Constitutional: Negative for chills and fever.  Respiratory: Negative for chest tightness and shortness of breath.   Cardiovascular: Positive for leg swelling. Negative for chest pain.  Musculoskeletal: Positive for arthralgias, back pain and myalgias.  Skin: Negative for color change and rash.  Neurological: Negative for light-headedness and headaches.  Psychiatric/Behavioral: Negative for agitation and behavioral problems.  All other systems reviewed and are negative.   Per HPI unless specifically indicated above     Objective:    BP 136/88   Pulse 92   Temp 99.4 F (37.4 C) (Oral)   Ht 5\' 5"  (1.651 m)   Wt 209 lb (94.8 kg)   BMI 34.78 kg/m   Wt Readings from Last 3 Encounters:  05/04/17 209 lb (94.8 kg)  04/22/17 207 lb (93.9 kg)  03/04/17 206 lb (93.4 kg)    Physical Exam  Constitutional: She is oriented to person, place, and time. She appears well-developed and well-nourished. No distress.  Eyes: Conjunctivae are normal.  Cardiovascular:  Normal rate, regular rhythm, normal heart sounds and intact distal pulses.   No murmur heard. Pulmonary/Chest: Effort normal and breath sounds normal. No respiratory distress. She has no wheezes. She has no rales.  Musculoskeletal: Normal range of motion.       Lumbar back: She exhibits tenderness. She exhibits normal range of motion, no bony tenderness, no deformity and normal pulse.       Back:       Left foot: There is tenderness and swelling. There is normal range of motion, normal capillary refill, no crepitus and no deformity.       Feet:  Neurological: She is alert and oriented to person, place, and time. Coordination normal.  Skin: Skin is warm and dry. No rash noted. She is not diaphoretic.    Psychiatric: She has a normal mood and affect. Her behavior is normal.  Nursing note and vitals reviewed.       Assessment & Plan:   Problem List Items Addressed This Visit    None    Visit Diagnoses    Left foot pain    -  Primary   Calf pain has improved and swelling in legs and proved but foot still hurts a lot on top of her foot, along second metatarsal   Relevant Orders   Ambulatory referral to Orthopedic Surgery   Acute left-sided low back pain without sciatica       Relevant Medications   cyclobenzaprine (FLEXERIL) 10 MG tablet       Follow up plan: Return if symptoms worsen or fail to improve.  Counseling provided for all of the vaccine components Orders Placed This Encounter  Procedures  . Ambulatory referral to [place] Estephan Gallardo, MD [facility] 05/04/2017, 11:03 AM

## 2017-05-11 ENCOUNTER — Ambulatory Visit (INDEPENDENT_AMBULATORY_CARE_PROVIDER_SITE_OTHER): Payer: Self-pay

## 2017-05-11 ENCOUNTER — Ambulatory Visit (INDEPENDENT_AMBULATORY_CARE_PROVIDER_SITE_OTHER): Payer: No Typology Code available for payment source

## 2017-05-11 ENCOUNTER — Ambulatory Visit (INDEPENDENT_AMBULATORY_CARE_PROVIDER_SITE_OTHER): Payer: No Typology Code available for payment source | Admitting: Orthopaedic Surgery

## 2017-05-11 ENCOUNTER — Encounter (INDEPENDENT_AMBULATORY_CARE_PROVIDER_SITE_OTHER): Payer: Self-pay | Admitting: Orthopaedic Surgery

## 2017-05-11 VITALS — BP 120/80 | HR 70 | Resp 14 | Ht 65.0 in | Wt 209.0 lb

## 2017-05-11 DIAGNOSIS — M79672 Pain in left foot: Secondary | ICD-10-CM

## 2017-05-11 DIAGNOSIS — M5440 Lumbago with sciatica, unspecified side: Secondary | ICD-10-CM | POA: Diagnosis not present

## 2017-05-11 NOTE — Progress Notes (Signed)
Office Visit Note   Patient: Caitlin Fritz           Date of Birth: 04-25-1971           MRN: [id number] Visit Date: 05/11/2017              Requested by: Worthy Rancher, MD [place], [place] [id number] PCP: Dettinger, Fransisca Kaufmann, MD   Assessment & Plan: Visit Diagnoses: Left foot pain specific diagnosis. Multiple diagnostic possibilities including stress fracture, diabetic neuropathy, early Charcot foot.etc.  Plan: MRI scan left foot and return shortly thereafter  Follow-Up Instructions: No Follow-up on file.   Orders:  No orders of the defined types were placed in this encounter.  No orders of the defined types were placed in this encounter.     Procedures: No procedures performed   Clinical Data: No additional findings.   Subjective: Chief Complaint  Patient presents with  . Left Foot - Pain, Edema    Pt presents with continued left foot pain. She was seen  on 04/04/2017 by Dr. Lowry Ram and has continued to be an issue since that time she still feels like she has swelling on the top of that foot.No DVT, denies injury, just woke up.  Recent films at another office were negative for any obvious pathology and specifically no evidence of a fracture. Insidious onset of pain approximately 5-6 weeks ago. She has discomfort when she is up and about and even at night. Ibuprofen seems to help. She does have history of diabetic neuropathy and is on Cymbalta. Gabapentin was not helpful. She does work as a [employer] copy  HPI  Review of Systems  Constitutional: Positive for fatigue. Negative for chills and fever.  Eyes: Negative for itching.  Respiratory: Negative for chest tightness and shortness of breath.   Cardiovascular: Negative for chest pain, palpitations and leg swelling.  Gastrointestinal: Negative for blood in stool, constipation and diarrhea.  Musculoskeletal: Positive for back pain and joint swelling. Negative for neck pain and neck stiffness.    Skin: Positive for rash.  Neurological: Positive for numbness. Negative for dizziness.  Hematological: Does not bruise/bleed easily.  Psychiatric/Behavioral: The patient is not nervous/anxious.      Objective: Vital Signs: BP 120/80   Pulse 70   Resp 14   Ht 5\' 5"  (1.651 m)   Wt 209 lb (94.8 kg)   BMI 34.78 kg/m   Physical Exam  Ortho Exam left foot with mild dorsal edema. Multiple areas of tenderness along the the forefoot specifically the metatarsals. Moderate tenderness along the distal third metatarsal. No pain with range of motion of the toes. +1 pulses. Both feet were just a little cool no heel pain. No ankle pain. Achilles intact. No calf pain. No pain range of motion of knee. Straight leg raise negative. No related pain with range of motion of either hip  Specialty Comments:  No specialty comments available.  Imaging: No results found.   PMFS History: Patient Active Problem List   Diagnosis Date Noted  . Obesity (BMI 30.0-34.9) 12/23/2016  . Hypertension associated with diabetes ([place]) 12/01/2016  . Type 2 diabetes mellitus with diabetic neuropathy, unspecified ([place]) 12/01/2016  . STD exposure 08/08/2014  . TIA (transient ischemic attack) 11/02/2013  . Adjustment disorder with mixed anxiety and depressed mood 09/24/2013  . Diabetic neuropathy, painful ([place]) 07/24/2013  . Dyslipidemia 06/25/2013  . Varicose veins of lower extremities with other complications 60/73/7106  . Asthma, chronic 06/11/2013  .  Hidradenitis suppurativa 06/11/2013  . DM2 (diabetes mellitus, type 2) ([place]) 06/11/2013   Past Medical History:  Diagnosis Date  . Asthma   . Diabetes mellitus without complication ([place])   . GERD (gastroesophageal reflux disease)   . Hydradenitis   . Hyperlipidemia   . Hypertension   . Seizures ([place])    once after taking clonidine  . Stroke [facility])    mini stroke June 2015    Family History  Problem Relation Age of Onset  . Hyperlipidemia Mother   .  Hypertension Mother   . Thyroid disease Mother   . Diabetes Father   . Heart disease Father   . Hypertension Father   . Cancer Maternal Grandmother   . Diabetes Maternal Grandfather   . Diabetes Paternal Grandfather   . Pneumonia Sister     Past Surgical History:  Procedure Laterality Date  . INCISION AND DRAINAGE     bilateral, hydradenitis   Social History   Occupational History  . Not on file.   Social History Main Topics  . Smoking status: Current Every Day Smoker    Packs/day: 0.50    Types: Cigarettes  . Smokeless tobacco: Never Used  . Alcohol use No     Comment: occ  . Drug use: No  . Sexual activity: Yes

## 2017-05-13 ENCOUNTER — Encounter: Payer: Self-pay | Admitting: Orthopaedic Surgery

## 2017-05-18 ENCOUNTER — Ambulatory Visit (INDEPENDENT_AMBULATORY_CARE_PROVIDER_SITE_OTHER): Payer: No Typology Code available for payment source | Admitting: Orthopaedic Surgery

## 2017-05-18 DIAGNOSIS — M79672 Pain in left foot: Secondary | ICD-10-CM | POA: Diagnosis not present

## 2017-05-18 MED ORDER — DICLOFENAC SODIUM 1 % TD GEL: 2.0000 g | Freq: Four times a day (QID) | TRANSDERMAL | 1 refills | Status: DC

## 2017-05-18 NOTE — Progress Notes (Signed)
Office Visit Note   Patient: Caitlin Fritz           Date of Birth: 02-24-1971           MRN: [id number] Visit Date: 05/18/2017              Requested by: Worthy Rancher, MD [place], [place] [address] PCP: Dettinger, Fransisca Kaufmann, MD   Assessment & Plan: Visit Diagnoses:  1. 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 Ligaments are intact. Tendons are intact. There is prominent soft tissue swelling dorsum of the forefoot probably related to the above arthritic areas  Plan: Long discussion regarding present problem of talonavicular arthritis. She is diabetic and it could represent early Charcot changes. We'll try Voltaren gel. She may continue with NSAIDs but needs to check with her family physician to be sure there were no renal impairment. Comfortable shoeing and cortisone injections. Even discussed a potential surgery depending upon her response to the above. Plan to see her back in the next month or so or when necessary  Follow-Up Instructions: Return in about 1 month (around 06/18/2017).   Orders:  No orders of the defined types were placed in this encounter.  Meds ordered this encounter  Medications  . diclofenac sodium (VOLTAREN) 1 % GEL    Sig: Apply 2 g topically 4 (four) times daily.    Dispense:  3 Tube    Refill:  1      Procedures: No procedures performed   Clinical Data: No additional findings.   Subjective: No chief complaint on file. MRI scan results are as above. 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 Her some shoes when she is more comfortable and others tickly a good formfitting sure that "wide". She's not having any numbness or tingling.  HPI  Review of Systems   Objective: Vital Signs: There were no vitals taken for this visit.  Physical  Exam  Ortho Exam there appears to be less swelling in the dorsum of the left foot than previously. She does have some local tenderness over the talonavicular joint. Skin intact. Neurologically intact. Range of motion of the ankle anteriorly posteriorly or over either malleolus. No crepitation.  Specialty Comments:  No specialty comments available.  Imaging: No results found.   PMFS History: Patient Active Problem List   Diagnosis Date Noted  . Obesity (BMI 30.0-34.9) 12/23/2016  . Hypertension associated with diabetes ([place]) 12/01/2016  . Type 2 diabetes mellitus with diabetic neuropathy, unspecified ([place]) 12/01/2016  . STD exposure 08/08/2014  . TIA (transient ischemic attack) 11/02/2013  . Adjustment disorder with mixed anxiety and depressed mood 09/24/2013  . Diabetic neuropathy, painful ([place]) 07/24/2013  . Dyslipidemia 06/25/2013  . Varicose veins of lower extremities with other complications 87/56/4332  . Asthma, chronic 06/11/2013  . Hidradenitis suppurativa 06/11/2013  . DM2 (diabetes mellitus, type 2) ([place]) 06/11/2013   Past Medical History:  Diagnosis Date  . Asthma   . Diabetes mellitus without complication ([place])   . GERD (gastroesophageal reflux disease)   . Hydradenitis   . Hyperlipidemia   . Hypertension   . Seizures ([place])    once after taking clonidine  . Stroke [facility])    mini stroke June 2015    Family History  Problem Relation Age of Onset  . Hyperlipidemia Mother   .  Hypertension Mother   . Thyroid disease Mother   . Diabetes Father   . Heart disease Father   . Hypertension Father   . Cancer Maternal Grandmother   . Diabetes Maternal Grandfather   . Diabetes Paternal Grandfather   . Pneumonia Sister     Past Surgical History:  Procedure Laterality Date  . INCISION AND DRAINAGE     bilateral, hydradenitis   Social History   Occupational History  . Not on file.   Social History Main Topics  . Smoking status: Current Every Day Smoker     Packs/day: 0.50    Types: Cigarettes  . Smokeless tobacco: Never Used  . Alcohol use No     Comment: occ  . Drug use: No  . Sexual activity: Yes     Garald Balding, MD   Note - This record has been created using [employer].  Chart creation errors have been sought, but may not always  have been located. Such creation errors do not reflect on  the standard of medical care.

## 2017-06-03 ENCOUNTER — Ambulatory Visit: Payer: No Typology Code available for payment source | Admitting: Family Medicine

## 2017-06-07 ENCOUNTER — Encounter: Payer: Self-pay | Admitting: Family Medicine

## 2017-11-02 DIAGNOSIS — I6521 Occlusion and stenosis of right carotid artery: Secondary | ICD-10-CM | POA: Insufficient documentation

## 2017-11-10 DIAGNOSIS — N921 Excessive and frequent menstruation with irregular cycle: Secondary | ICD-10-CM | POA: Insufficient documentation

## 2017-12-15 ENCOUNTER — Ambulatory Visit: Payer: Self-pay | Admitting: Physician Assistant

## 2017-12-15 ENCOUNTER — Encounter: Payer: Self-pay | Admitting: Physician Assistant

## 2017-12-15 VITALS — BP 167/81 | HR 95 | Temp 97.7°F | Ht 64.0 in | Wt 205.0 lb

## 2017-12-15 DIAGNOSIS — E785 Hyperlipidemia, unspecified: Secondary | ICD-10-CM

## 2017-12-15 DIAGNOSIS — H9190 Unspecified hearing loss, unspecified ear: Secondary | ICD-10-CM

## 2017-12-15 DIAGNOSIS — Z7689 Persons encountering health services in other specified circumstances: Secondary | ICD-10-CM

## 2017-12-15 DIAGNOSIS — N938 Other specified abnormal uterine and vaginal bleeding: Secondary | ICD-10-CM

## 2017-12-15 DIAGNOSIS — Z87891 Personal history of nicotine dependence: Secondary | ICD-10-CM

## 2017-12-15 DIAGNOSIS — Z7901 Long term (current) use of anticoagulants: Secondary | ICD-10-CM

## 2017-12-15 DIAGNOSIS — R4701 Aphasia: Secondary | ICD-10-CM

## 2017-12-15 DIAGNOSIS — Z8673 Personal history of transient ischemic attack (TIA), and cerebral infarction without residual deficits: Secondary | ICD-10-CM

## 2017-12-15 DIAGNOSIS — I10 Essential (primary) hypertension: Secondary | ICD-10-CM

## 2017-12-15 DIAGNOSIS — E669 Obesity, unspecified: Secondary | ICD-10-CM

## 2017-12-15 DIAGNOSIS — E1140 Type 2 diabetes mellitus with diabetic neuropathy, unspecified: Secondary | ICD-10-CM

## 2017-12-15 MED ORDER — AMLODIPINE BESYLATE 10 MG PO TABS: 10.0000 mg | ORAL_TABLET | Freq: Every day | ORAL | 3 refills | Status: DC

## 2017-12-15 NOTE — Progress Notes (Signed)
BP (!) 167/81 (BP Location: Right Arm, Patient Position: Sitting, Cuff Size: Normal)   Pulse 95   Temp 97.7 F (36.5 C)   Ht 5\' 4"  (1.626 m)   Wt 205 lb (93 kg)   SpO2 100%   BMI 35.19 kg/m    Subjective:    Patient ID: Caitlin Fritz, female    DOB: [birth year], 47 y.o.   MRN: [id number]  HPI: Caitlin Fritz is a 47 y.o. female presenting on 12/15/2017 for New Patient (Initial Visit)   HPI   Chief Complaint  Patient presents with  . New Patient (Initial Visit)     Pt used to go to [facility] but lost her instuance  She had a stroke Jan 29.  She says she has been out of work on [employer] since that time.  She has appointment with neurologist next week to determine if she can return to work.   She went to [facility] and was transferred to [facility].   Her neurology appointment next week is in [place] with a [facility] dr.   Abbott Pao Was on gabapentin in past 800 mg tid and it didn't help her neuropathy.  That is why she is taking the duloxetine now.   Pt Can't hear R ear- that was what she went to hospital for when she was diagnosed with stroke  She has applied for disability  She has had 2 periods since feb 7.  She was put on provera prior to that for abnormal bleeding  She had pelvic US at [facility] brought in bs log- most good in the am and high at bedtime- like 150-225  Relevant past medical, surgical, family and social history reviewed and updated as indicated. Interim medical history since our last visit reviewed. Allergies and medications reviewed and updated.   Current Outpatient Medications:  .  aspirin 325 MG tablet, Take 325 mg by mouth daily., Disp: , Rfl:  .  atorvastatin (LIPITOR) 40 MG tablet, Take 40 mg by mouth daily., Disp: , Rfl:  .  Calcium Carb-Cholecalciferol (CALCIUM 600/VITAMIN D3 PO), Take 1 tablet by mouth daily., Disp: , Rfl:  .  clopidogrel (PLAVIX) 75 MG tablet, Take 75 mg by mouth daily., Disp: , Rfl:  .  diclofenac sodium (VOLTAREN) 1  % GEL, Apply 2 g topically 4 (four) times daily. (Patient taking differently: Apply 2 g topically as needed. ), Disp: 3 Tube, Rfl: 1 .  DULoxetine (CYMBALTA) 20 MG capsule, Take 20 mg by mouth daily., Disp: , Rfl:  .  ferrous sulfate 325 (65 FE) MG tablet, Take 325 mg by mouth daily with breakfast., Disp: , Rfl:  .  insulin NPH-regular Human (NOVOLIN 70/30) (70-30) 100 UNIT/ML injection, Inject 15 Units into the skin 2 (two) times daily with a meal., Disp: , Rfl:  .  lisinopril (PRINIVIL,ZESTRIL) 10 MG tablet, Take 10 mg by mouth daily., Disp: , Rfl:  .  medroxyPROGESTERone (PROVERA) 10 MG tablet, Take 10 mg by mouth daily., Disp: , Rfl:  .  metFORMIN (GLUCOPHAGE) 850 MG tablet, Take 850 mg by mouth 2 (two) times daily with a meal., Disp: , Rfl:  .  Multiple Vitamins-Minerals (CENTRUM ADULTS PO), Take 1 tablet by mouth daily., Disp: , Rfl:  .  nicotine (NICODERM CQ - DOSED IN MG/24 HOURS) 14 mg/24hr patch, Place 1 patch (14 mg total) onto the skin daily., Disp: 28 patch, Rfl: 0   Review of Systems  Constitutional: Negative for appetite change, chills, diaphoresis, fatigue, fever and  unexpected weight change.  HENT: Positive for ear pain and hearing loss. Negative for congestion, dental problem, drooling, facial swelling, mouth sores, sneezing, sore throat, trouble swallowing and voice change.   Eyes: Positive for pain, itching and visual disturbance. Negative for discharge and redness.  Respiratory: Negative for cough, choking, shortness of breath and wheezing.   Cardiovascular: Negative for chest pain, palpitations and leg swelling.  Gastrointestinal: Negative for abdominal pain, blood in stool, constipation, diarrhea and vomiting.  Endocrine: Positive for cold intolerance. Negative for heat intolerance and polydipsia.  Genitourinary: Negative for decreased urine volume, dysuria and hematuria.  Musculoskeletal: Positive for arthralgias and back pain. Negative for gait problem.  Skin: Negative  for rash.  Allergic/Immunologic: Negative for environmental allergies.  Neurological: Positive for light-headedness and headaches. Negative for seizures and syncope.  Hematological: Negative for adenopathy.  Psychiatric/Behavioral: Positive for agitation and dysphoric mood. Negative for suicidal ideas. The patient is nervous/anxious.     Per HPI unless specifically indicated above     Objective:    BP (!) 167/81 (BP Location: Right Arm, Patient Position: Sitting, Cuff Size: Normal)   Pulse 95   Temp 97.7 F (36.5 C)   Ht 5\' 4"  (1.626 m)   Wt 205 lb (93 kg)   SpO2 100%   BMI 35.19 kg/m   Wt Readings from Last 3 Encounters:  12/15/17 205 lb (93 kg)  05/11/17 209 lb (94.8 kg)  05/04/17 209 lb (94.8 kg)    Physical Exam  Constitutional: She is oriented to person, place, and time. She appears well-developed and well-nourished.  HENT:  Head: Normocephalic and atraumatic.  Mouth/Throat: Oropharynx is clear and moist. No oropharyngeal exudate.  Eyes: Conjunctivae and EOM are normal. Pupils are equal, round, and reactive to light.  Neck: Neck supple. No thyromegaly present.  Cardiovascular: Normal rate and regular rhythm.  Pulmonary/Chest: Effort normal and breath sounds normal.  Abdominal: Soft. Bowel sounds are normal. She exhibits no mass. There is no hepatosplenomegaly. There is no tenderness.  Musculoskeletal: She exhibits no edema.  Lymphadenopathy:    She has no cervical adenopathy.  Neurological: She is alert and oriented to person, place, and time. Gait normal.  Skin: Skin is warm and dry.  Psychiatric: Her behavior is normal. Judgment and thought content normal. Her speech is delayed. She exhibits a depressed mood.  Pt with obvious difficulty expressing herself at times.  This leads her to get frustrated and she became tearful several times.   Vitals reviewed.        Assessment & Plan:    Encounter Diagnoses  Name Primary?  . Encounter to establish care Yes  .  History of ischemic right ICA stroke   . Type 2 diabetes mellitus with diabetic neuropathy, unspecified whether long term insulin use ([place])   . Essential hypertension   . Dyslipidemia   . Obesity, unspecified classification, unspecified obesity type, unspecified whether serious comorbidity present   . DUB (dysfunctional uterine bleeding)   . Anticoagulant long-term use   . Hearing loss, unspecified hearing loss type, unspecified laterality   . History of cigarette smoking   . Expressive aphasia     -Refer to gyn for DUB -Gave [facility] application -will get Baseline labs -will Restart amlodipine for uncontrolled bp -encouraged pt to contact [facility] for counseling -pt to follow up with her neurologist as scheduled.   -will not change the dm meds until after labs reviewed -will get pt signed up for [facility]assist  -pt to follow up  1 month.  RTO sooner prn

## 2017-12-15 NOTE — Patient Instructions (Addendum)
-  Amlodipine- [facility] -[facility] application -Get bloodwork/labs -Gynecologist will call you for appointment -Return tax paper for medication assistance   Mail- Atorvastatin Clopidogrel Duloxetine Lisinopril Metformin

## 2017-12-16 ENCOUNTER — Other Ambulatory Visit (HOSPITAL_COMMUNITY)
Admission: RE | Admit: 2017-12-16 | Discharge: 2017-12-16 | Disposition: A | Discharge status: Home / Self Care | Payer: No Typology Code available for payment source | Source: Ambulatory Visit | Attending: Physician Assistant | Admitting: Physician Assistant

## 2017-12-16 DIAGNOSIS — Z7901 Long term (current) use of anticoagulants: Secondary | ICD-10-CM

## 2017-12-16 DIAGNOSIS — I10 Essential (primary) hypertension: Secondary | ICD-10-CM | POA: Diagnosis present

## 2017-12-16 DIAGNOSIS — E785 Hyperlipidemia, unspecified: Secondary | ICD-10-CM

## 2017-12-16 DIAGNOSIS — Z8673 Personal history of transient ischemic attack (TIA), and cerebral infarction without residual deficits: Secondary | ICD-10-CM

## 2017-12-16 DIAGNOSIS — R69 Illness, unspecified: Secondary | ICD-10-CM | POA: Diagnosis present

## 2017-12-16 DIAGNOSIS — E1140 Type 2 diabetes mellitus with diabetic neuropathy, unspecified: Secondary | ICD-10-CM | POA: Diagnosis present

## 2017-12-16 DIAGNOSIS — N938 Other specified abnormal uterine and vaginal bleeding: Secondary | ICD-10-CM

## 2017-12-16 LAB — COMPREHENSIVE METABOLIC PANEL
ALK PHOS: 61 U/L (ref 38–126)
ALT: 12 U/L — ABNORMAL LOW (ref 14–54)
ANION GAP: 10 (ref 5–15)
AST: 13 U/L — ABNORMAL LOW (ref 15–41)
Albumin: 3.6 g/dL (ref 3.5–5.0)
BUN: 8 mg/dL (ref 6–20)
CALCIUM: 8.7 mg/dL — AB (ref 8.9–10.3)
CO2: 25 mmol/L (ref 22–32)
Chloride: 105 mmol/L (ref 101–111)
Creatinine, Ser: 0.51 mg/dL (ref 0.44–1.00)
Glucose, Bld: 121 mg/dL — ABNORMAL HIGH (ref 65–99)
Potassium: 3.8 mmol/L (ref 3.5–5.1)
Sodium: 140 mmol/L (ref 135–145)
Total Bilirubin: 0.4 mg/dL (ref 0.3–1.2)
Total Protein: 7.1 g/dL (ref 6.5–8.1)

## 2017-12-16 LAB — CBC
HCT: 36.8 % (ref 36.0–46.0)
HEMOGLOBIN: 10.8 g/dL — AB (ref 12.0–15.0)
MCH: 25.2 pg — ABNORMAL LOW (ref 26.0–34.0)
MCHC: 29.3 g/dL — ABNORMAL LOW (ref 30.0–36.0)
MCV: 85.8 fL (ref 78.0–100.0)
Platelets: 276 10*3/uL (ref 150–400)
RBC: 4.29 MIL/uL (ref 3.87–5.11)
RDW: 18.4 % — ABNORMAL HIGH (ref 11.5–15.5)
WBC: 6.9 10*3/uL (ref 4.0–10.5)

## 2017-12-16 LAB — LIPID PANEL
CHOL/HDL RATIO: 2.4 ratio
CHOLESTEROL: 92 mg/dL (ref 0–200)
HDL: 38 mg/dL — AB (ref >40)
LDL Cholesterol: 43 mg/dL (ref 0–99)
TRIGLYCERIDES: 56 mg/dL (ref <150)
VLDL: 11 mg/dL (ref 0–40)

## 2017-12-16 LAB — HEMOGLOBIN A1C
Hgb A1c MFr Bld: 6.8 % — ABNORMAL HIGH (ref 4.8–5.6)
Mean Plasma Glucose: 148.46 mg/dL

## 2017-12-17 LAB — MICROALBUMIN, URINE: MICROALB UR: 13.7 ug/mL — AB

## 2017-12-20 ENCOUNTER — Other Ambulatory Visit: Payer: Self-pay | Admitting: Physician Assistant

## 2017-12-20 MED ORDER — CLOPIDOGREL BISULFATE 75 MG PO TABS: 75.0000 mg | ORAL_TABLET | Freq: Every day | ORAL | 1 refills | Status: DC

## 2017-12-20 MED ORDER — LISINOPRIL 10 MG PO TABS: 10.0000 mg | ORAL_TABLET | Freq: Every day | ORAL | 1 refills | Status: DC

## 2017-12-20 MED ORDER — METFORMIN HCL 1000 MG PO TABS: 1000.0000 mg | ORAL_TABLET | Freq: Two times a day (BID) | ORAL | 1 refills | Status: DC

## 2017-12-20 MED ORDER — DULOXETINE HCL 20 MG PO CPEP: 20.0000 mg | ORAL_CAPSULE | Freq: Every day | ORAL | 1 refills | Status: DC

## 2017-12-20 MED ORDER — ATORVASTATIN CALCIUM 40 MG PO TABS: 40.0000 mg | ORAL_TABLET | Freq: Every day | ORAL | 1 refills | Status: DC

## 2017-12-28 ENCOUNTER — Other Ambulatory Visit: Payer: Self-pay | Admitting: Family Medicine

## 2017-12-28 ENCOUNTER — Encounter: Payer: Self-pay | Admitting: Physician Assistant

## 2017-12-28 DIAGNOSIS — F17200 Nicotine dependence, unspecified, uncomplicated: Secondary | ICD-10-CM

## 2017-12-29 ENCOUNTER — Other Ambulatory Visit: Payer: Self-pay | Admitting: Physician Assistant

## 2017-12-29 MED ORDER — DULOXETINE HCL 20 MG PO CPEP: 20.0000 mg | ORAL_CAPSULE | Freq: Every day | ORAL | 0 refills | Status: DC

## 2017-12-29 MED ORDER — CLOPIDOGREL BISULFATE 75 MG PO TABS: 75.0000 mg | ORAL_TABLET | Freq: Every day | ORAL | 0 refills | Status: DC

## 2017-12-29 MED ORDER — LISINOPRIL 10 MG PO TABS: 10.0000 mg | ORAL_TABLET | Freq: Every day | ORAL | 0 refills | Status: DC

## 2018-01-09 ENCOUNTER — Encounter: Payer: No Typology Code available for payment source | Admitting: Obstetrics and Gynecology

## 2018-01-10 ENCOUNTER — Encounter: Payer: Self-pay | Admitting: Physician Assistant

## 2018-01-17 ENCOUNTER — Ambulatory Visit: Payer: Self-pay | Admitting: Physician Assistant

## 2018-01-17 ENCOUNTER — Encounter: Payer: Self-pay | Admitting: Physician Assistant

## 2018-01-17 VITALS — BP 145/85 | HR 88 | Temp 98.1°F | Ht 64.0 in | Wt 204.0 lb

## 2018-01-17 DIAGNOSIS — E1140 Type 2 diabetes mellitus with diabetic neuropathy, unspecified: Secondary | ICD-10-CM

## 2018-01-17 DIAGNOSIS — D649 Anemia, unspecified: Secondary | ICD-10-CM

## 2018-01-17 DIAGNOSIS — Z8673 Personal history of transient ischemic attack (TIA), and cerebral infarction without residual deficits: Secondary | ICD-10-CM | POA: Insufficient documentation

## 2018-01-17 DIAGNOSIS — E785 Hyperlipidemia, unspecified: Secondary | ICD-10-CM

## 2018-01-17 DIAGNOSIS — E669 Obesity, unspecified: Secondary | ICD-10-CM

## 2018-01-17 DIAGNOSIS — N938 Other specified abnormal uterine and vaginal bleeding: Secondary | ICD-10-CM

## 2018-01-17 DIAGNOSIS — R4701 Aphasia: Secondary | ICD-10-CM

## 2018-01-17 DIAGNOSIS — I10 Essential (primary) hypertension: Secondary | ICD-10-CM

## 2018-01-17 DIAGNOSIS — Z7901 Long term (current) use of anticoagulants: Secondary | ICD-10-CM

## 2018-01-17 MED ORDER — INSULIN GLARGINE 100 UNIT/ML SOLOSTAR PEN: 15.0000 [IU] | PEN_INJECTOR | Freq: Every day | SUBCUTANEOUS | 99 refills | Status: DC

## 2018-01-17 NOTE — Progress Notes (Signed)
BP (!) 145/85 (BP Location: Right Arm, Patient Position: Sitting, Cuff Size: Normal)   Pulse 88   Temp 98.1 F (36.7 C) (Other (Comment))   Ht 5\' 4"  (1.626 m)   Wt 204 lb (92.5 kg)   SpO2 98%   BMI 35.02 kg/m    Subjective:    Patient ID: Caitlin Fritz, female    DOB: 06-Sep-1971, 47 y.o.   MRN: [id number]  HPI: Caitlin Fritz is a 47 y.o. female presenting on 01/17/2018 for Diabetes (foot exam; has appt with Dr Elonda Husky for menstrual problems) and Hypertension   HPI   Chief Complaint  Patient presents with  . Diabetes    foot exam; has appt with Dr Elonda Husky for menstrual problems  . Hypertension   Pt did not contact [facility]  Pt went to her neurologist and was told to follow up there in 6 months.  She was seen for post-stroke  Relevant past medical, surgical, family and social history reviewed and updated as indicated. Interim medical history since our last visit reviewed. Allergies and medications reviewed and updated.   Current Outpatient Medications:  .  amLODipine (NORVASC) 10 MG tablet, Take 1 tablet (10 mg total) by mouth daily., Disp: 30 tablet, Rfl: 3 .  aspirin 325 MG tablet, Take 325 mg by mouth daily., Disp: , Rfl:  .  atorvastatin (LIPITOR) 40 MG tablet, Take 1 tablet (40 mg total) by mouth daily., Disp: 90 tablet, Rfl: 1 .  clopidogrel (PLAVIX) 75 MG tablet, Take 1 tablet (75 mg total) by mouth daily., Disp: 30 tablet, Rfl: 0 .  diclofenac sodium (VOLTAREN) 1 % GEL, Apply 2 g topically 4 (four) times daily. (Patient taking differently: Apply 2 g topically as needed. ), Disp: 3 Tube, Rfl: 1 .  DULoxetine (CYMBALTA) 20 MG capsule, Take 1 capsule (20 mg total) by mouth daily., Disp: 30 capsule, Rfl: 0 .  ferrous sulfate 325 (65 FE) MG tablet, Take 325 mg by mouth daily with breakfast., Disp: , Rfl:  .  insulin NPH-regular Human (NOVOLIN 70/30) (70-30) 100 UNIT/ML injection, Inject 15 Units into the skin 2 (two) times daily with a meal., Disp: , Rfl:  .  lisinopril  (PRINIVIL,ZESTRIL) 10 MG tablet, Take 1 tablet (10 mg total) by mouth daily., Disp: 30 tablet, Rfl: 0 .  medroxyPROGESTERone (PROVERA) 10 MG tablet, Take 10 mg by mouth daily., Disp: , Rfl:  .  metFORMIN (GLUCOPHAGE) 1000 MG tablet, Take 1 tablet (1,000 mg total) by mouth 2 (two) times daily with a meal., Disp: 180 tablet, Rfl: 1   Review of Systems  Constitutional: Positive for appetite change and fatigue. Negative for chills, diaphoresis, fever and unexpected weight change.  HENT: Positive for congestion, hearing loss and voice change. Negative for dental problem, drooling, ear pain, facial swelling, mouth sores, sneezing, sore throat and trouble swallowing.   Eyes: Positive for pain, discharge, redness and itching. Negative for visual disturbance.  Respiratory: Positive for cough and wheezing. Negative for choking and shortness of breath.   Cardiovascular: Negative for chest pain, palpitations and leg swelling.  Gastrointestinal: Positive for constipation. Negative for abdominal pain, blood in stool, diarrhea and vomiting.  Endocrine: Positive for heat intolerance. Negative for cold intolerance and polydipsia.  Genitourinary: Negative for decreased urine volume, dysuria and hematuria.  Musculoskeletal: Negative for arthralgias, back pain and gait problem.  Skin: Negative for rash.  Allergic/Immunologic: Negative for environmental allergies.  Neurological: Positive for headaches. Negative for seizures, syncope and light-headedness.  Hematological: Negative for adenopathy.  Psychiatric/Behavioral: Positive for dysphoric mood. Negative for agitation and suicidal ideas. The patient is nervous/anxious.     Per HPI unless specifically indicated above     Objective:    BP (!) 145/85 (BP Location: Right Arm, Patient Position: Sitting, Cuff Size: Normal)   Pulse 88   Temp 98.1 F (36.7 C) (Other (Comment))   Ht 5\' 4"  (1.626 m)   Wt 204 lb (92.5 kg)   SpO2 98%   BMI 35.02 kg/m   Wt  Readings from Last 3 Encounters:  01/17/18 204 lb (92.5 kg)  12/15/17 205 lb (93 kg)  05/11/17 209 lb (94.8 kg)    Physical Exam  Constitutional: She is oriented to person, place, and time. She appears well-developed and well-nourished.  HENT:  Head: Normocephalic and atraumatic.  Neck: Neck supple.  Cardiovascular: Normal rate and regular rhythm.  Pulmonary/Chest: Effort normal and breath sounds normal.  Abdominal: Soft. Bowel sounds are normal. She exhibits no mass. There is no hepatosplenomegaly. There is no tenderness.  Musculoskeletal: She exhibits no edema.  Lymphadenopathy:    She has no cervical adenopathy.  Neurological: She is alert and oriented to person, place, and time.  Skin: Skin is warm and dry.  Psychiatric: She has a normal mood and affect. Her behavior is normal.  Vitals reviewed.   Results for orders placed or performed during the hospital encounter of 12/16/17  Microalbumin, urine  Result Value Ref Range   Microalb, Ur 13.7 (H) Not Estab. ug/mL  Hemoglobin A1c  Result Value Ref Range   Hgb A1c MFr Bld 6.8 (H) 4.8 - 5.6 %   Mean Plasma Glucose 148.46 mg/dL  Lipid panel  Result Value Ref Range   Cholesterol 92 0 - 200 mg/dL   Triglycerides 56 <150 mg/dL   HDL 38 (L) >40 mg/dL   Total CHOL/HDL Ratio 2.4 RATIO   VLDL 11 0 - 40 mg/dL   LDL Cholesterol 43 0 - 99 mg/dL  Comprehensive metabolic panel  Result Value Ref Range   Sodium 140 135 - 145 mmol/L   Potassium 3.8 3.5 - 5.1 mmol/L   Chloride 105 101 - 111 mmol/L   CO2 25 22 - 32 mmol/L   Glucose, Bld 121 (H) 65 - 99 mg/dL   BUN 8 6 - 20 mg/dL   Creatinine, Ser 0.51 0.44 - 1.00 mg/dL   Calcium 8.7 (L) 8.9 - 10.3 mg/dL   Total Protein 7.1 6.5 - 8.1 g/dL   Albumin 3.6 3.5 - 5.0 g/dL   AST 13 (L) 15 - 41 U/L   ALT 12 (L) 14 - 54 U/L   Alkaline Phosphatase 61 38 - 126 U/L   Total Bilirubin 0.4 0.3 - 1.2 mg/dL   GFR calc non Af Amer >60 >60 mL/min   GFR calc Af Amer >60 >60 mL/min   Anion gap 10 5 -  15  CBC  Result Value Ref Range   WBC 6.9 4.0 - 10.5 K/uL   RBC 4.29 3.87 - 5.11 MIL/uL   Hemoglobin 10.8 (L) 12.0 - 15.0 g/dL   HCT 36.8 36.0 - 46.0 %   MCV 85.8 78.0 - 100.0 fL   MCH 25.2 (L) 26.0 - 34.0 pg   MCHC 29.3 (L) 30.0 - 36.0 g/dL   RDW 18.4 (H) 11.5 - 15.5 %   Platelets 276 150 - 400 K/uL      Assessment & Plan:   Encounter Diagnoses  Name Primary?  . Type 2 diabetes mellitus with diabetic  neuropathy, unspecified whether long term insulin use ([place]) Yes  . Essential hypertension   . History of ischemic right ICA stroke   . Dyslipidemia   . Obesity, unspecified classification, unspecified obesity type, unspecified whether serious comorbidity present   . Anticoagulant long-term use   . DUB (dysfunctional uterine bleeding)   . Expressive aphasia   . Anemia, unspecified type     -reviewed labs with pt -will Discontinue novolin 70/30 and start lantus 10 u qhs.  Pt reminded to call office for fbs < 70 or > 300.   -continue iron for anemia. Pt to go to Gyn as scheduled for menorrhagia -encouraged pt to go to [facility] for her depression -pt to Follow up 1 month to recheck bp and bs

## 2018-01-31 ENCOUNTER — Ambulatory Visit (INDEPENDENT_AMBULATORY_CARE_PROVIDER_SITE_OTHER): Payer: No Typology Code available for payment source | Admitting: Obstetrics & Gynecology

## 2018-01-31 ENCOUNTER — Encounter: Payer: Self-pay | Admitting: Obstetrics & Gynecology

## 2018-01-31 VITALS — BP 158/72 | HR 98 | Ht 66.0 in | Wt 200.0 lb

## 2018-01-31 DIAGNOSIS — D62 Acute posthemorrhagic anemia: Secondary | ICD-10-CM | POA: Diagnosis not present

## 2018-01-31 DIAGNOSIS — N921 Excessive and frequent menstruation with irregular cycle: Secondary | ICD-10-CM | POA: Diagnosis not present

## 2018-01-31 DIAGNOSIS — N946 Dysmenorrhea, unspecified: Secondary | ICD-10-CM | POA: Diagnosis not present

## 2018-01-31 MED ORDER — MEGESTROL ACETATE 40 MG PO TABS: ORAL_TABLET | ORAL | 3 refills | Status: DC

## 2018-01-31 NOTE — Progress Notes (Signed)
Chief Complaint  Patient presents with  . Vaginal Bleeding    got worse after stroke in Jan.       47 y.o. G1P0100 No LMP recorded. The current method of family planning is none.  Outpatient Encounter Medications as of 01/31/2018  Medication Sig  . aspirin 325 MG tablet Take 325 mg by mouth daily.  Marland Kitchen atorvastatin (LIPITOR) 40 MG tablet Take 1 tablet (40 mg total) by mouth daily.  . clopidogrel (PLAVIX) 75 MG tablet Take 1 tablet (75 mg total) by mouth daily.  . diclofenac sodium (VOLTAREN) 1 % GEL Apply 2 g topically 4 (four) times daily. (Patient not taking: Reported on 05/16/2018)  . DULoxetine (CYMBALTA) 20 MG capsule Take 1 capsule (20 mg total) by mouth daily.  . Insulin Glargine (LANTUS SOLOSTAR) 100 UNIT/ML Solostar Pen Inject 15 Units into the skin at bedtime.  . metFORMIN (GLUCOPHAGE) 1000 MG tablet Take 1 tablet (1,000 mg total) by mouth 2 (two) times daily with a meal.  . [DISCONTINUED] amLODipine (NORVASC) 10 MG tablet Take 1 tablet (10 mg total) by mouth daily.  . [DISCONTINUED] ferrous sulfate 325 (65 FE) MG tablet Take 325 mg by mouth daily with breakfast.  . [DISCONTINUED] lisinopril (PRINIVIL,ZESTRIL) 10 MG tablet Take 1 tablet (10 mg total) by mouth daily.  . [DISCONTINUED] medroxyPROGESTERone (PROVERA) 10 MG tablet Take 10 mg by mouth daily.  . megestrol (MEGACE) 40 MG tablet 3 tablets a day for 5 days, 2 tablets a day for 5 days then 1 tablet daily (Patient taking differently: Take 40 mg by mouth daily. 3 tablets a day for 5 days, 2 tablets a day for 5 days then 1 tablet daily)   No facility-administered encounter medications on file as of 01/31/2018.     Subjective Caitlin Fritz has long history of heavy periods Worse since on plavix in January Clots soils clothes and sheets  Cramping not worse really Past Medical History:  Diagnosis Date  . Asthma   . Bronchitis   . COPD (chronic obstructive pulmonary disease) ([place])   . Depression   . Diabetes  mellitus without complication [facility])    diagnosed age 20  . GERD (gastroesophageal reflux disease)   . Hydradenitis   . Hypertension   . Seizures ([place])    once after taking clonidine  . Stroke [facility])    mini stroke June 2015, regular stroke Jan. 29, 2019    Past Surgical History:  Procedure Laterality Date  . INCISE AND DRAIN ABCESS     right inner thigh  . INCISION AND DRAINAGE  2013   bilateral, hydradenitis done at [facility]    OB History    Gravida  1   Para  1   Term  0   Preterm  1   AB  0   Living        SAB  0   TAB  0   Ectopic  0   Multiple      Live Births  1           Allergies  Allergen Reactions  . Clonidine Derivatives Other (See Comments)    Seizures   . Clonidine Derivatives Other (See Comments)    seizures  . Prednisone Other (See Comments)    Eye drops only-temporary blindness Chemical burn in eyes   . Adhesive [Tape] Rash  . Eye Drops [Tetrahydrozoline] Rash    Social History   Socioeconomic History  . Marital status: Married  Spouse name: Not on file  . Number of children: Not on file  . Years of education: Not on file  . Highest education level: Not on file  Occupational History  . Not on file  Social Needs  . Financial resource strain: Not on file  . Food insecurity:    Worry: Not on file    Inability: Not on file  . Transportation needs:    Medical: Not on file    Non-medical: Not on file  Tobacco Use  . Smoking status: Former Smoker    Packs/day: 0.50    Years: 20.00    Pack years: 10.00    Types: Cigarettes    Last attempt to quit: 11/01/2017    Years since quitting: 0.5  . Smokeless tobacco: Never Used  Substance and Sexual Activity  . Alcohol use: No    Comment: occ  . Drug use: No  . Sexual activity: Yes    Birth control/protection: None  Lifestyle  . Physical activity:    Days per week: Not on file    Minutes per session: Not on file  . Stress: Not on file  Relationships  . Social  connections:    Talks on phone: Not on file    Gets together: Not on file    Attends religious service: Not on file    Active member of club or organization: Not on file    Attends meetings of clubs or organizations: Not on file    Relationship status: Not on file  Other Topics Concern  . Not on file  Social History Narrative   ** Merged History Encounter **        Family History  Problem Relation Age of Onset  . Hyperlipidemia Mother   . Hypertension Mother   . Thyroid disease Mother   . Diabetes Father   . Heart disease Father   . Hypertension Father   . Cancer Maternal Grandmother        cervical cancer  . Diabetes Maternal Grandfather   . Diabetes Paternal Grandmother   . Pneumonia Sister   . Preterm labor Son     Medications:       Current Outpatient Medications:  .  aspirin 325 MG tablet, Take 325 mg by mouth daily., Disp: , Rfl:  .  atorvastatin (LIPITOR) 40 MG tablet, Take 1 tablet (40 mg total) by mouth daily., Disp: 90 tablet, Rfl: 1 .  clopidogrel (PLAVIX) 75 MG tablet, Take 1 tablet (75 mg total) by mouth daily., Disp: 30 tablet, Rfl: 0 .  diclofenac sodium (VOLTAREN) 1 % GEL, Apply 2 g topically 4 (four) times daily. (Patient not taking: Reported on 05/16/2018), Disp: 3 Tube, Rfl: 1 .  DULoxetine (CYMBALTA) 20 MG capsule, Take 1 capsule (20 mg total) by mouth daily., Disp: 30 capsule, Rfl: 0 .  Insulin Glargine (LANTUS SOLOSTAR) 100 UNIT/ML Solostar Pen, Inject 15 Units into the skin at bedtime., Disp: 5 pen, Rfl: PRN .  metFORMIN (GLUCOPHAGE) 1000 MG tablet, Take 1 tablet (1,000 mg total) by mouth 2 (two) times daily with a meal., Disp: 180 tablet, Rfl: 1 .  amLODipine (NORVASC) 10 MG tablet, Take 1 tablet (10 mg total) by mouth daily., Disp: 90 tablet, Rfl: 3 .  amoxicillin-clavulanate (AUGMENTIN) 875-125 MG tablet, Take 1 tablet by mouth 2 (two) times daily. (Patient not taking: Reported on 05/16/2018), Disp: 20 tablet, Rfl: 0 .  ferrous sulfate 325 (65 FE) MG  tablet, Take 1 tablet (325 mg total) by  mouth daily with breakfast., Disp: 90 tablet, Rfl: 3 .  losartan (COZAAR) 100 MG tablet, Take 1 tablet (100 mg total) by mouth daily., Disp: 90 tablet, Rfl: 1 .  megestrol (MEGACE) 40 MG tablet, 3 tablets a day for 5 days, 2 tablets a day for 5 days then 1 tablet daily (Patient taking differently: Take 40 mg by mouth daily. 3 tablets a day for 5 days, 2 tablets a day for 5 days then 1 tablet daily), Disp: 45 tablet, Rfl: 3 .  megestrol (MEGACE) 40 MG tablet, 2 tablets daily, Disp: 60 tablet, Rfl: 3 .  metoprolol tartrate (LOPRESSOR) 50 MG tablet, Take 2 tablets (100 mg total) by mouth 2 (two) times daily., Disp: 60 tablet, Rfl: 3  Objective Blood pressure (!) 158/72, pulse 98, height 5\' 6"  (1.676 m), weight 200 lb (90.7 kg), not currently breastfeeding.  Gen WDWN NAD   Pertinent ROS No burning with urination, frequency or urgency No nausea, vomiting or diarrhea Nor fever chills or other constitutional symptoms   Labs or studies     Impression Diagnoses this Encounter::   ICD-10-CM   1. Menometrorrhagia N92.1 US PELVIS TRANSVANGINAL NON-OB (TV ONLY)    US PELVIS (TRANSABDOMINAL ONLY)  2. Dysmenorrhea N94.6 US PELVIS TRANSVANGINAL NON-OB (TV ONLY)    US PELVIS (TRANSABDOMINAL ONLY)  3. Acute on chronic blood loss anemia D62 US PELVIS TRANSVANGINAL NON-OB (TV ONLY)    US PELVIS (TRANSABDOMINAL ONLY)    Established relevant diagnosis(es):   Plan/Recommendations: Meds ordered this encounter  Medications  . megestrol (MEGACE) 40 MG tablet    Sig: 3 tablets a day for 5 days, 2 tablets a day for 5 days then 1 tablet daily    Dispense:  45 tablet    Refill:  3    Labs or Scans Ordered: Orders Placed This Encounter  Procedures  . US PELVIS TRANSVANGINAL NON-OB (TV ONLY)  . US PELVIS (TRANSABDOMINAL ONLY)    Management:: Megestrol algorithm therapy to stop her bleeding I am unable to pull up her [facility] interaction on Care  Everywhere Will have her come back in next week for sonogram and evaluate her bleeding since starting the megestrol   Follow up Return in about 1 week (around 02/07/2018) for GYN sono, Follow up, with Dr Elonda Husky.     All questions were answered.

## 2018-02-07 ENCOUNTER — Ambulatory Visit: Payer: No Typology Code available for payment source | Admitting: Obstetrics & Gynecology

## 2018-02-07 ENCOUNTER — Ambulatory Visit: Payer: Self-pay | Admitting: Obstetrics & Gynecology

## 2018-02-07 ENCOUNTER — Ambulatory Visit: Payer: No Typology Code available for payment source

## 2018-02-11 ENCOUNTER — Encounter: Payer: Self-pay | Admitting: Obstetrics & Gynecology

## 2018-02-11 ENCOUNTER — Encounter: Payer: Self-pay | Admitting: Physician Assistant

## 2018-02-13 ENCOUNTER — Encounter: Payer: Self-pay | Admitting: Obstetrics & Gynecology

## 2018-02-21 ENCOUNTER — Ambulatory Visit: Payer: Self-pay | Admitting: Physician Assistant

## 2018-02-21 ENCOUNTER — Encounter: Payer: Self-pay | Admitting: Physician Assistant

## 2018-02-21 VITALS — BP 150/88 | HR 98 | Temp 97.5°F | Ht 66.0 in | Wt 202.0 lb

## 2018-02-21 DIAGNOSIS — D649 Anemia, unspecified: Secondary | ICD-10-CM

## 2018-02-21 DIAGNOSIS — E1140 Type 2 diabetes mellitus with diabetic neuropathy, unspecified: Secondary | ICD-10-CM

## 2018-02-21 DIAGNOSIS — I10 Essential (primary) hypertension: Secondary | ICD-10-CM

## 2018-02-21 MED ORDER — FERROUS SULFATE 325 (65 FE) MG PO TABS: 325.0000 mg | ORAL_TABLET | Freq: Every day | ORAL | 3 refills | Status: DC

## 2018-02-21 MED ORDER — AMLODIPINE BESYLATE 10 MG PO TABS: 10.0000 mg | ORAL_TABLET | Freq: Every day | ORAL | 3 refills | Status: DC

## 2018-02-21 MED ORDER — LOSARTAN POTASSIUM 50 MG PO TABS: 50.0000 mg | ORAL_TABLET | Freq: Every day | ORAL | 1 refills | Status: DC

## 2018-02-21 MED ORDER — CEPHALEXIN 500 MG PO CAPS: 500.0000 mg | ORAL_CAPSULE | Freq: Three times a day (TID) | ORAL | 0 refills | Status: AC

## 2018-02-21 NOTE — Progress Notes (Signed)
BP (!) 150/88 (BP Location: Left Arm, Patient Position: Sitting, Cuff Size: Normal)   Pulse 98   Temp (!) 97.5 F (36.4 C)   Ht 5\' 6"  (1.676 m)   Wt 202 lb (91.6 kg)   SpO2 99%   BMI 32.60 kg/m    Subjective:    Patient ID: Caitlin Fritz, female    DOB: November 03, 1970, 47 y.o.   MRN: [id number]  HPI: Caitlin Fritz is a 47 y.o. female presenting on 02/21/2018 for Diabetes and Hypertension   HPI   Pt is doing well with the lantus.  bs log reviewed.  Mostly in 100s with a few in the high 90s  152 was the highest.  Pt states no episodes of hypoglycemia  Pt says the lisinopril is giving her a cough  Pt states pain and swelling around L eye for past 2 days.  She says it feels puffy.  No fever.  No change in vision.   Relevant past medical, surgical, family and social history reviewed and updated as indicated. Interim medical history since our last visit reviewed. Allergies and medications reviewed and updated.   Current Outpatient Medications:  .  amLODipine (NORVASC) 10 MG tablet, Take 1 tablet (10 mg total) by mouth daily., Disp: 30 tablet, Rfl: 3 .  aspirin 325 MG tablet, Take 325 mg by mouth daily., Disp: , Rfl:  .  atorvastatin (LIPITOR) 40 MG tablet, Take 1 tablet (40 mg total) by mouth daily., Disp: 90 tablet, Rfl: 1 .  clopidogrel (PLAVIX) 75 MG tablet, Take 1 tablet (75 mg total) by mouth daily., Disp: 30 tablet, Rfl: 0 .  diclofenac sodium (VOLTAREN) 1 % GEL, Apply 2 g topically 4 (four) times daily., Disp: 3 Tube, Rfl: 1 .  DULoxetine (CYMBALTA) 20 MG capsule, Take 1 capsule (20 mg total) by mouth daily., Disp: 30 capsule, Rfl: 0 .  ferrous sulfate 325 (65 FE) MG tablet, Take 325 mg by mouth daily with breakfast., Disp: , Rfl:  .  Insulin Glargine (LANTUS SOLOSTAR) 100 UNIT/ML Solostar Pen, Inject 15 Units into the skin at bedtime. (Patient taking differently: Inject 10 Units into the skin at bedtime. ), Disp: 5 pen, Rfl: PRN .  lisinopril (PRINIVIL,ZESTRIL) 10 MG  tablet, Take 1 tablet (10 mg total) by mouth daily., Disp: 30 tablet, Rfl: 0 .  megestrol (MEGACE) 40 MG tablet, 3 tablets a day for 5 days, 2 tablets a day for 5 days then 1 tablet daily, Disp: 45 tablet, Rfl: 3 .  metFORMIN (GLUCOPHAGE) 1000 MG tablet, Take 1 tablet (1,000 mg total) by mouth 2 (two) times daily with a meal., Disp: 180 tablet, Rfl: 1 .  medroxyPROGESTERone (PROVERA) 10 MG tablet, Take 10 mg by mouth daily., Disp: , Rfl:    Review of Systems  Constitutional: Positive for fatigue. Negative for appetite change, chills, diaphoresis, fever and unexpected weight change.  HENT: Positive for ear pain and hearing loss. Negative for congestion, dental problem, drooling, facial swelling, mouth sores, sneezing, sore throat, trouble swallowing and voice change.   Eyes: Positive for pain, discharge and itching. Negative for redness and visual disturbance.  Respiratory: Positive for cough. Negative for choking, shortness of breath and wheezing.   Cardiovascular: Negative for chest pain, palpitations and leg swelling.  Gastrointestinal: Positive for abdominal pain. Negative for blood in stool, constipation, diarrhea and vomiting.  Endocrine: Positive for cold intolerance. Negative for heat intolerance and polydipsia.  Genitourinary: Negative for decreased urine volume, dysuria and hematuria.  Musculoskeletal: Negative for  arthralgias, back pain and gait problem.  Skin: Negative for rash.  Allergic/Immunologic: Negative for environmental allergies.  Neurological: Negative for seizures, syncope, light-headedness and headaches.  Hematological: Negative for adenopathy.  Psychiatric/Behavioral: Positive for agitation and dysphoric mood. Negative for suicidal ideas. The patient is not nervous/anxious.     Per HPI unless specifically indicated above     Objective:    BP (!) 150/88 (BP Location: Left Arm, Patient Position: Sitting, Cuff Size: Normal)   Pulse 98   Temp (!) 97.5 F (36.4 C)    Ht 5\' 6"  (1.676 m)   Wt 202 lb (91.6 kg)   SpO2 99%   BMI 32.60 kg/m   Wt Readings from Last 3 Encounters:  02/21/18 202 lb (91.6 kg)  01/31/18 200 lb (90.7 kg)  01/17/18 204 lb (92.5 kg)    Physical Exam  Constitutional: She is oriented to person, place, and time. She appears well-developed and well-nourished.  HENT:  Head: Normocephalic and atraumatic.  Eyes: Conjunctivae and EOM are normal.  There is puffiness and tenderness along the L lateral brow and lateral to the eye.  It is not red or hot.  The eye is normal.  There is no swelling.   Neck: Neck supple.  Cardiovascular: Normal rate and regular rhythm.  Pulmonary/Chest: Effort normal and breath sounds normal.  Abdominal: Soft. Bowel sounds are normal. She exhibits no mass. There is no hepatosplenomegaly. There is no tenderness.  Musculoskeletal: She exhibits no edema.  Lymphadenopathy:    She has no cervical adenopathy.  Neurological: She is alert and oriented to person, place, and time.  Skin: Skin is warm and dry.  Psychiatric: She has a normal mood and affect. Her behavior is normal.  Vitals reviewed.       Assessment & Plan:   Encounter Diagnoses  Name Primary?  . Type 2 diabetes mellitus with diabetic neuropathy, unspecified whether long term insulin use ([place]) Yes  . Essential hypertension   . Anemia, unspecified type      -Order amlodipine and iron from [facility] -Discontinue lisinopril and rx losartan -Continue lantus 10u -rx keflex for eye (L) -F/u 1 mo to recheck blood pressure.  RTO sooner for any worsening or new symptoms

## 2018-02-21 NOTE — Patient Instructions (Signed)
Financial counselor- [phone]   

## 2018-03-05 IMAGING — US US EXTREM LOW VENOUS*L*
1 series · 13 of 24 positions shown · non-contrast
Comparison: None.

CLINICAL DATA: Left calf pain and swelling.



[Series 1: us extrem low venous*left* · 0.07mm/px · 13 of 47 slices shown]
[im 1/47]
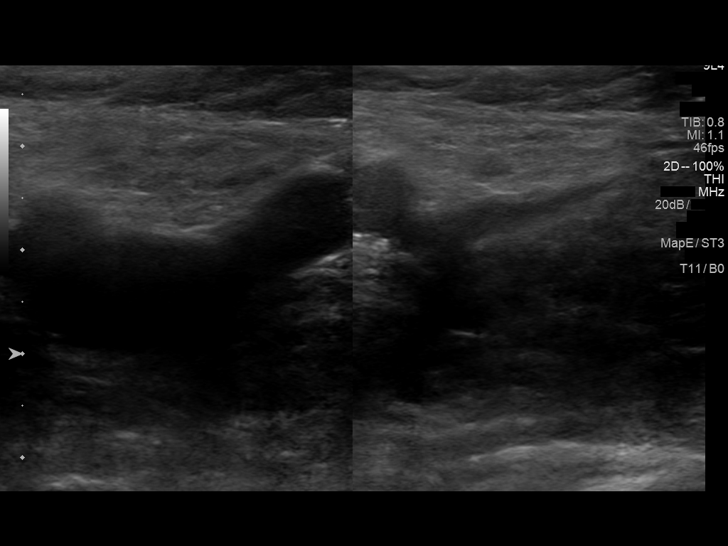
[im 5/47]
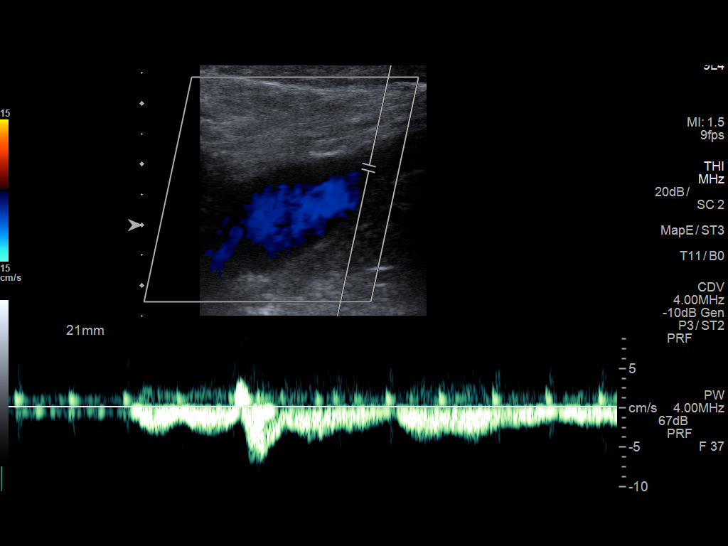
[im 9/47]
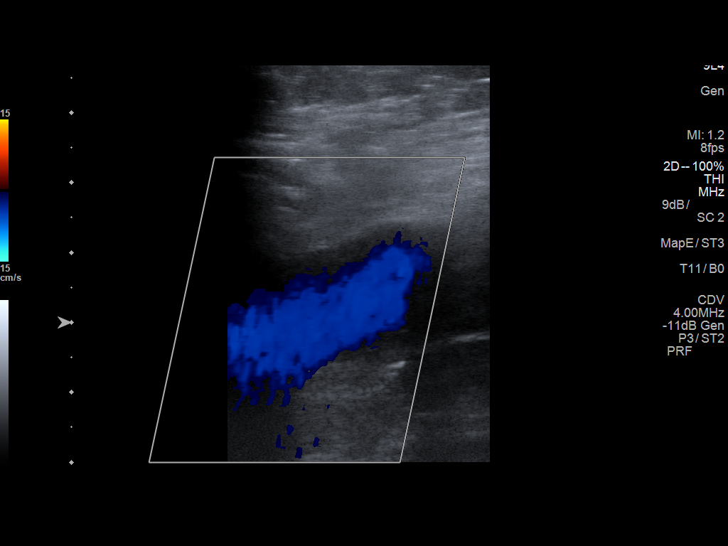
[im 13/47]
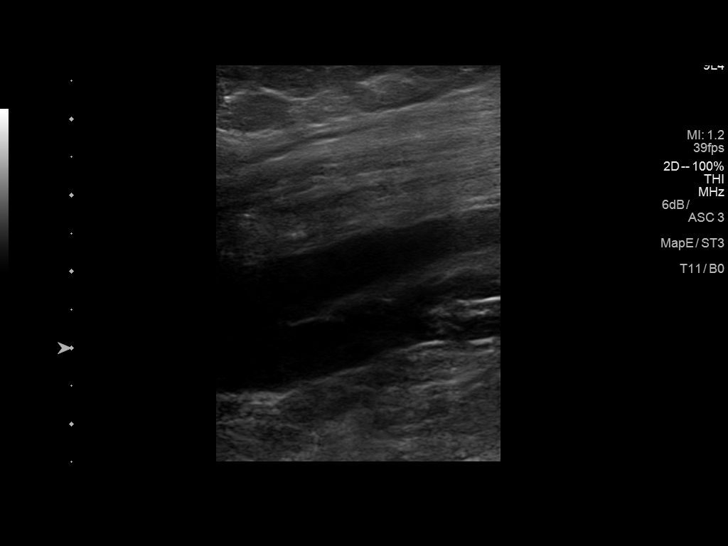
[im 17/47]
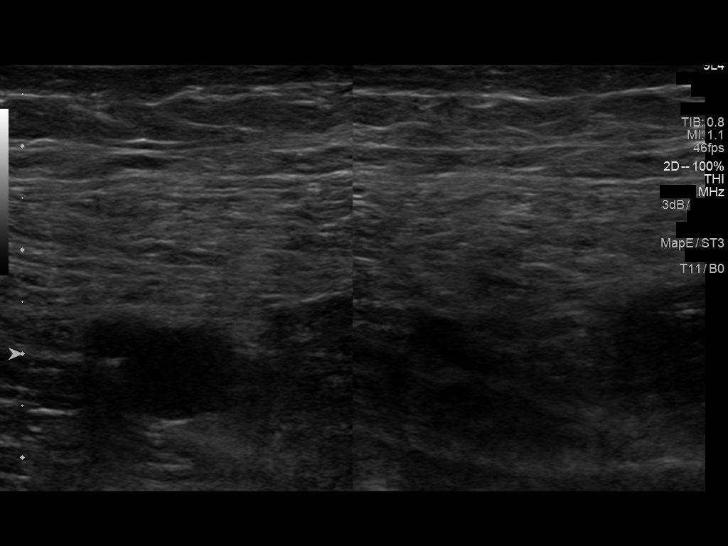
[im 21/47]
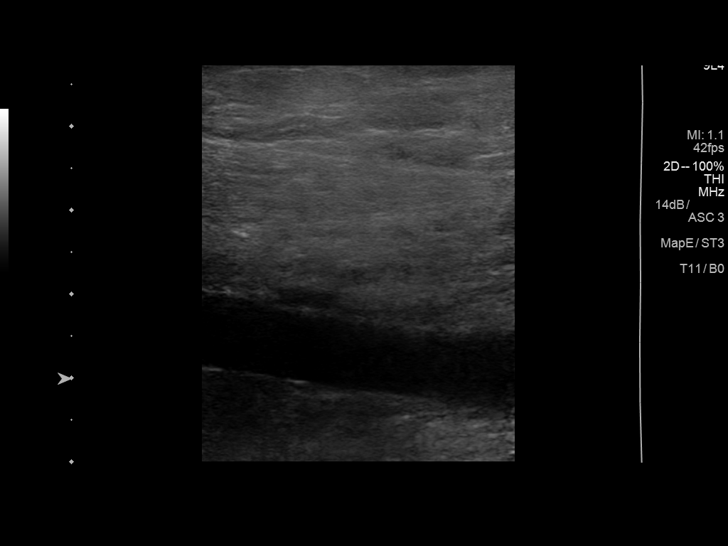
[im 25/47]
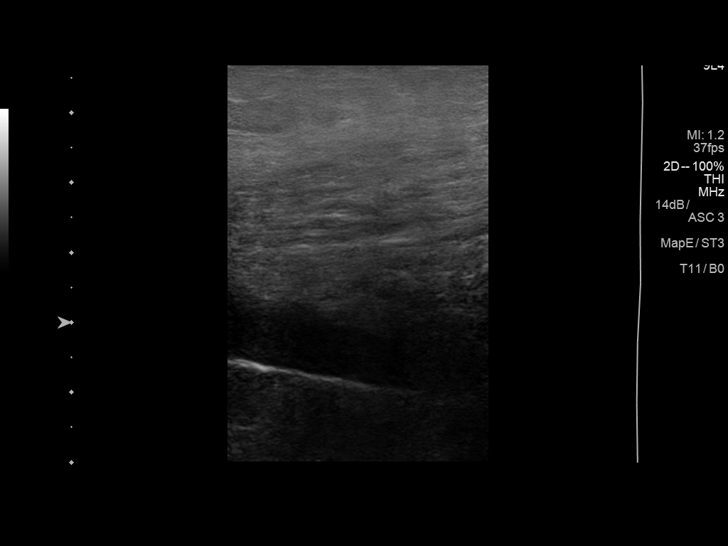
[im 27/47]
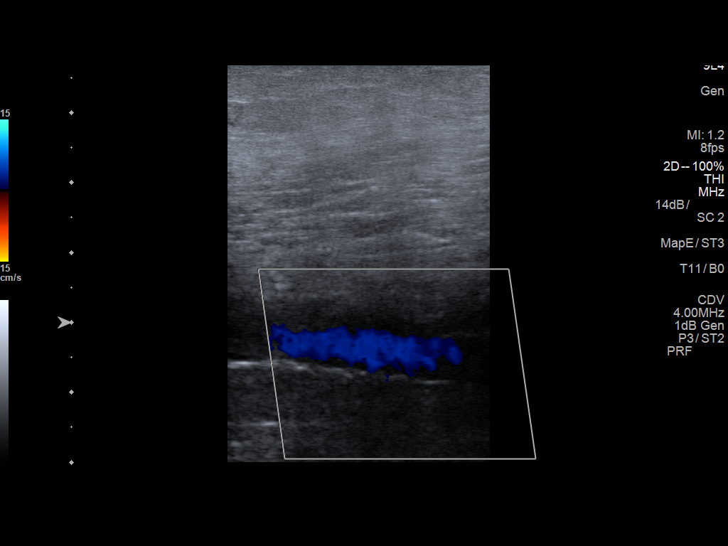
[im 31/47]
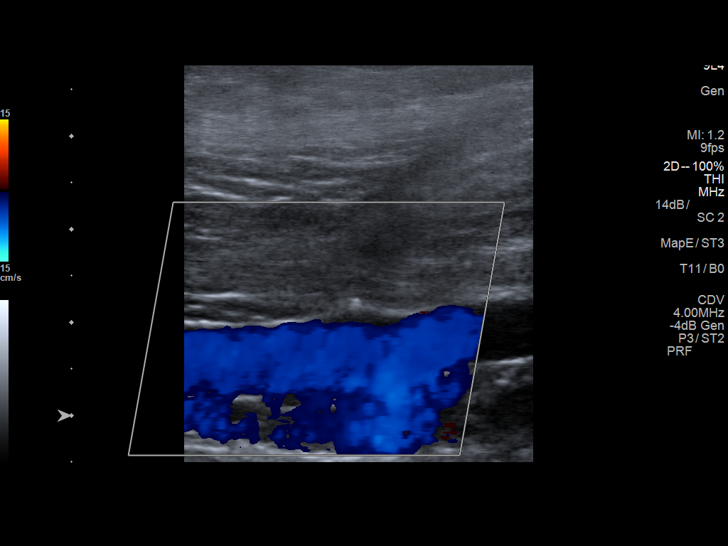
[im 35/47]
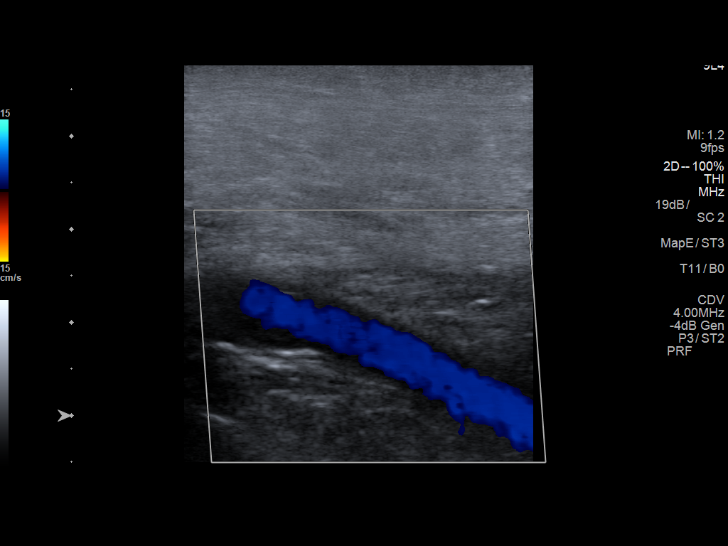
[im 39/47]
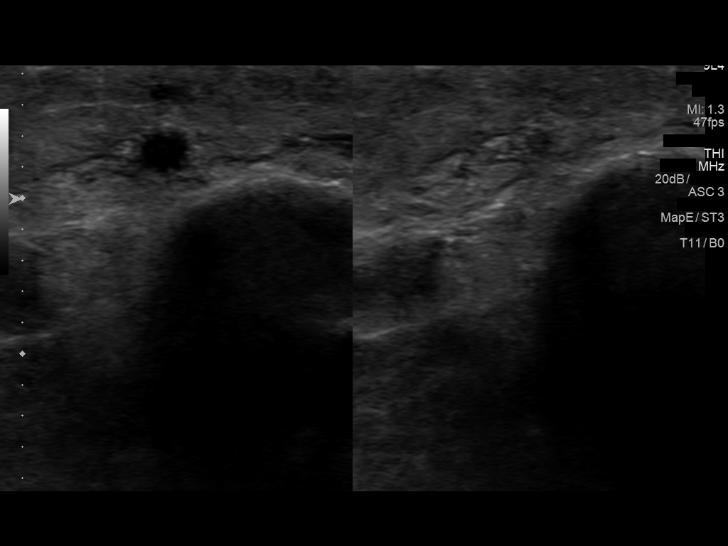
[im 43/47]
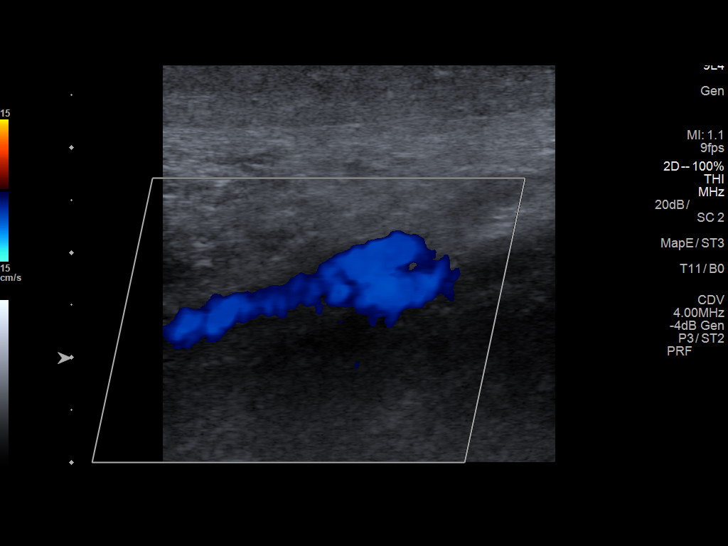
[im 47/47]
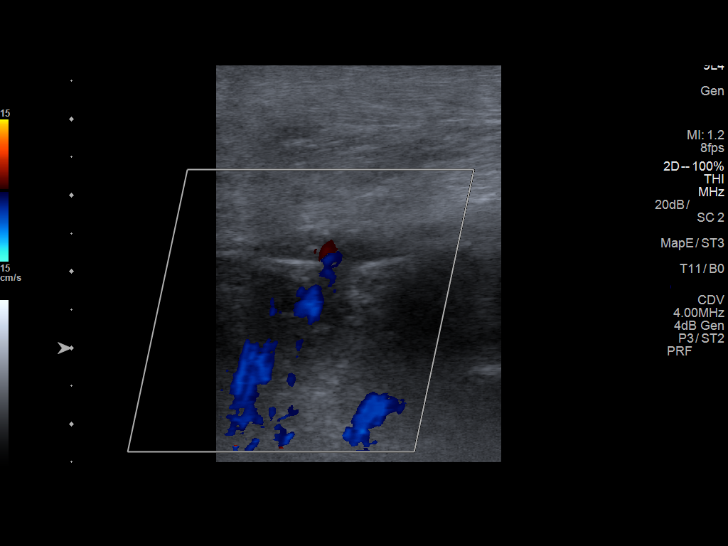

[13 of 24 positions shown; findings below may reference images not displayed]

FINDINGS: Contralateral Common Femoral Vein: Respiratory phasicity is normal
and symmetric with the symptomatic side. No evidence of thrombus.
Normal compressibility.

Common Femoral Vein: No evidence of thrombus. Normal
compressibility, respiratory phasicity and response to augmentation.

Saphenofemoral Junction: No evidence of thrombus. Normal
compressibility and flow on color Doppler imaging.

Profunda Femoral Vein: No evidence of thrombus. Normal
compressibility and flow on color Doppler imaging.

Femoral Vein: No evidence of thrombus. Normal compressibility,
respiratory phasicity and response to augmentation.

Popliteal Vein: No evidence of thrombus. Normal compressibility,
respiratory phasicity and response to augmentation.

Calf Veins: No evidence of thrombus. Normal compressibility and flow
on color Doppler imaging.

Superficial Great Saphenous Vein: No evidence of thrombus. Normal
compressibility and flow on color Doppler imaging.

Other Findings:  None.
IMPRESSION: No evidence of DVT within the left lower extremity.

## 2018-03-21 ENCOUNTER — Encounter: Payer: Self-pay | Admitting: Physician Assistant

## 2018-03-21 ENCOUNTER — Ambulatory Visit: Payer: Self-pay | Admitting: Physician Assistant

## 2018-03-21 VITALS — BP 160/80 | HR 93 | Temp 97.9°F | Ht 66.0 in | Wt 199.8 lb

## 2018-03-21 DIAGNOSIS — I10 Essential (primary) hypertension: Secondary | ICD-10-CM

## 2018-03-21 MED ORDER — LOSARTAN POTASSIUM 100 MG PO TABS: 100.0000 mg | ORAL_TABLET | Freq: Every day | ORAL | 1 refills | Status: DC

## 2018-03-21 NOTE — Progress Notes (Signed)
BP (!) 160/80 (BP Location: Left Arm, Patient Position: Sitting, Cuff Size: Normal)   Pulse 93   Temp 97.9 F (36.6 C)   Ht 5\' 6"  (1.676 m)   Wt 199 lb 12 oz (90.6 kg)   SpO2 98%   BMI 32.24 kg/m    Subjective:    Patient ID: Caitlin Fritz, female    DOB: 05-08-1971, 47 y.o.   MRN: [id number]  HPI: Caitlin Fritz is a 47 y.o. female presenting on 03/21/2018 for Hypertension   HPI   Pt says blood sugars have been good.   Pt says she is taking her htn medications  Pt didn't get her labs drawn  Relevant past medical, surgical, family and social history reviewed and updated as indicated. Interim medical history since our last visit reviewed. Allergies and medications reviewed and updated.   Current Outpatient Medications:  .  amLODipine (NORVASC) 10 MG tablet, Take 1 tablet (10 mg total) by mouth daily., Disp: 90 tablet, Rfl: 3 .  aspirin 325 MG tablet, Take 325 mg by mouth daily., Disp: , Rfl:  .  atorvastatin (LIPITOR) 40 MG tablet, Take 1 tablet (40 mg total) by mouth daily., Disp: 90 tablet, Rfl: 1 .  clopidogrel (PLAVIX) 75 MG tablet, Take 1 tablet (75 mg total) by mouth daily., Disp: 30 tablet, Rfl: 0 .  diclofenac sodium (VOLTAREN) 1 % GEL, Apply 2 g topically 4 (four) times daily. (Patient taking differently: Apply 2 g topically 4 (four) times daily as needed. ), Disp: 3 Tube, Rfl: 1 .  DULoxetine (CYMBALTA) 20 MG capsule, Take 1 capsule (20 mg total) by mouth daily., Disp: 30 capsule, Rfl: 0 .  ferrous sulfate 325 (65 FE) MG tablet, Take 1 tablet (325 mg total) by mouth daily with breakfast., Disp: 90 tablet, Rfl: 3 .  Insulin Glargine (LANTUS SOLOSTAR) 100 UNIT/ML Solostar Pen, Inject 15 Units into the skin at bedtime. (Patient taking differently: Inject 10 Units into the skin at bedtime. ), Disp: 5 pen, Rfl: PRN .  losartan (COZAAR) 50 MG tablet, Take 1 tablet (50 mg total) by mouth daily., Disp: 30 tablet, Rfl: 1 .  megestrol (MEGACE) 40 MG tablet, 3 tablets a day  for 5 days, 2 tablets a day for 5 days then 1 tablet daily (Patient taking differently: Take 40 mg by mouth daily. 3 tablets a day for 5 days, 2 tablets a day for 5 days then 1 tablet daily), Disp: 45 tablet, Rfl: 3 .  metFORMIN (GLUCOPHAGE) 1000 MG tablet, Take 1 tablet (1,000 mg total) by mouth 2 (two) times daily with a meal., Disp: 180 tablet, Rfl: 1 .  medroxyPROGESTERone (PROVERA) 10 MG tablet, Take 10 mg by mouth daily., Disp: , Rfl:   Review of Systems  Constitutional: Positive for diaphoresis and fatigue. Negative for appetite change, chills, fever and unexpected weight change.  HENT: Negative for congestion, dental problem, drooling, ear pain, facial swelling, hearing loss, mouth sores, sneezing, sore throat, trouble swallowing and voice change.   Eyes: Positive for pain and itching. Negative for discharge, redness and visual disturbance.  Respiratory: Negative for cough, choking, shortness of breath and wheezing.   Cardiovascular: Negative for chest pain, palpitations and leg swelling.  Gastrointestinal: Negative for abdominal pain, blood in stool, constipation, diarrhea and vomiting.  Endocrine: Negative for cold intolerance, heat intolerance and polydipsia.  Genitourinary: Negative for decreased urine volume, dysuria and hematuria.  Musculoskeletal: Negative for arthralgias, back pain and gait problem.  Skin: Negative for rash.  Allergic/Immunologic:  Negative for environmental allergies.  Neurological: Positive for headaches. Negative for seizures, syncope and light-headedness.  Hematological: Negative for adenopathy.  Psychiatric/Behavioral: Negative for agitation, dysphoric mood and suicidal ideas. The patient is not nervous/anxious.     Per HPI unless specifically indicated above     Objective:    BP (!) 160/80 (BP Location: Left Arm, Patient Position: Sitting, Cuff Size: Normal)   Pulse 93   Temp 97.9 F (36.6 C)   Ht 5\' 6"  (1.676 m)   Wt 199 lb 12 oz (90.6 kg)   SpO2  98%   BMI 32.24 kg/m   Wt Readings from Last 3 Encounters:  03/21/18 199 lb 12 oz (90.6 kg)  02/21/18 202 lb (91.6 kg)  01/31/18 200 lb (90.7 kg)    Physical Exam  Constitutional: She is oriented to person, place, and time. She appears well-developed and well-nourished.  HENT:  Head: Normocephalic and atraumatic.  Neck: Neck supple.  Cardiovascular: Normal rate and regular rhythm.  Pulmonary/Chest: Effort normal and breath sounds normal.  Abdominal: Soft. Bowel sounds are normal. She exhibits no mass. There is no hepatosplenomegaly. There is no tenderness.  Musculoskeletal: She exhibits no edema.  Lymphadenopathy:    She has no cervical adenopathy.  Neurological: She is alert and oriented to person, place, and time.  Skin: Skin is warm and dry.  Psychiatric: She has a normal mood and affect. Her behavior is normal.  Vitals reviewed.        Assessment & Plan:   Encounter Diagnosis  Name Primary?  . Essential hypertension Yes    -increase losartan -Pt to get labs drawn  -Follow up 1 month

## 2018-04-18 ENCOUNTER — Other Ambulatory Visit (HOSPITAL_COMMUNITY)
Admission: RE | Admit: 2018-04-18 | Discharge: 2018-04-18 | Disposition: A | Discharge status: Home / Self Care | Payer: PRIVATE HEALTH INSURANCE | Source: Ambulatory Visit | Attending: Physician Assistant | Admitting: Physician Assistant

## 2018-04-18 DIAGNOSIS — D649 Anemia, unspecified: Secondary | ICD-10-CM

## 2018-04-18 DIAGNOSIS — E1140 Type 2 diabetes mellitus with diabetic neuropathy, unspecified: Secondary | ICD-10-CM

## 2018-04-18 DIAGNOSIS — I10 Essential (primary) hypertension: Secondary | ICD-10-CM

## 2018-04-18 LAB — COMPREHENSIVE METABOLIC PANEL
ALT: 10 U/L (ref 0–44)
ANION GAP: 9 (ref 5–15)
AST: 12 U/L — AB (ref 15–41)
Albumin: 3.3 g/dL — ABNORMAL LOW (ref 3.5–5.0)
Alkaline Phosphatase: 67 U/L (ref 38–126)
BILIRUBIN TOTAL: 0.4 mg/dL (ref 0.3–1.2)
BUN: 9 mg/dL (ref 6–20)
CHLORIDE: 110 mmol/L (ref 98–111)
CO2: 21 mmol/L — ABNORMAL LOW (ref 22–32)
Calcium: 9.1 mg/dL (ref 8.9–10.3)
Creatinine, Ser: 0.53 mg/dL (ref 0.44–1.00)
GFR calc Af Amer: >60 mL/min (ref >60)
Glucose, Bld: 146 mg/dL — ABNORMAL HIGH (ref 70–99)
POTASSIUM: 3.8 mmol/L (ref 3.5–5.1)
SODIUM: 140 mmol/L (ref 135–145)
TOTAL PROTEIN: 7.2 g/dL (ref 6.5–8.1)

## 2018-04-18 LAB — LIPID PANEL
CHOL/HDL RATIO: 3.3 ratio
Cholesterol: 87 mg/dL (ref 0–200)
HDL: 26 mg/dL — AB (ref >40)
LDL CALC: 52 mg/dL (ref 0–99)
Triglycerides: 47 mg/dL (ref <150)
VLDL: 9 mg/dL (ref 0–40)

## 2018-04-18 LAB — HEMOGLOBIN AND HEMATOCRIT, BLOOD
HEMATOCRIT: 40.1 % (ref 36.0–46.0)
Hemoglobin: 13.1 g/dL (ref 12.0–15.0)

## 2018-04-19 LAB — HEMOGLOBIN A1C
HEMOGLOBIN A1C: 8.2 % — AB (ref 4.8–5.6)
Mean Plasma Glucose: 189 mg/dL

## 2018-04-25 ENCOUNTER — Encounter: Payer: Self-pay | Admitting: Physician Assistant

## 2018-04-25 ENCOUNTER — Ambulatory Visit: Payer: Self-pay | Admitting: Physician Assistant

## 2018-04-25 VITALS — BP 173/98 | HR 93 | Temp 97.7°F | Ht 66.0 in | Wt 200.0 lb

## 2018-04-25 DIAGNOSIS — E785 Hyperlipidemia, unspecified: Secondary | ICD-10-CM

## 2018-04-25 DIAGNOSIS — J329 Chronic sinusitis, unspecified: Secondary | ICD-10-CM

## 2018-04-25 DIAGNOSIS — E1165 Type 2 diabetes mellitus with hyperglycemia: Secondary | ICD-10-CM

## 2018-04-25 DIAGNOSIS — I10 Essential (primary) hypertension: Secondary | ICD-10-CM

## 2018-04-25 MED ORDER — METOPROLOL TARTRATE 50 MG PO TABS: 50.0000 mg | ORAL_TABLET | Freq: Two times a day (BID) | ORAL | 3 refills | Status: DC

## 2018-04-25 MED ORDER — AMOXICILLIN-POT CLAVULANATE 875-125 MG PO TABS: 1.0000 | ORAL_TABLET | Freq: Two times a day (BID) | ORAL | 0 refills | Status: DC

## 2018-04-25 NOTE — Progress Notes (Signed)
BP (!) 180/95 (BP Location: Right Arm, Patient Position: Sitting, Cuff Size: Normal)   Pulse 93   Temp 97.7 F (36.5 C)   Ht 5\' 6"  (1.676 m)   Wt 200 lb (90.7 kg)   SpO2 100%   BMI 32.28 kg/m    Subjective:    Patient ID: Caitlin Fritz, female    DOB: 10/24/1970, 47 y.o.   MRN: [id number]  HPI: Loeta Herst is a 47 y.o. female presenting on 04/25/2018 for Hypertension and Diabetes   HPI   She says she checks her BP 3 times/week.  She says 159 was the highest she's gotten  Pt says she checks fbs- range is 146-94  Pt says she is still having a lot of R eye pain and soreness. She Was treated 2 months ago with keflex for same  Pt says she is hurting in the R eye but otherwise feels well.  This pain never went away after treated in May, she says.     Relevant past medical, surgical, family and social history reviewed and updated as indicated. Interim medical history since our last visit reviewed. Allergies and medications reviewed and updated.   Current Outpatient Medications:  .  amLODipine (NORVASC) 10 MG tablet, Take 1 tablet (10 mg total) by mouth daily., Disp: 90 tablet, Rfl: 3 .  aspirin 325 MG tablet, Take 325 mg by mouth daily., Disp: , Rfl:  .  atorvastatin (LIPITOR) 40 MG tablet, Take 1 tablet (40 mg total) by mouth daily., Disp: 90 tablet, Rfl: 1 .  clopidogrel (PLAVIX) 75 MG tablet, Take 1 tablet (75 mg total) by mouth daily., Disp: 30 tablet, Rfl: 0 .  diclofenac sodium (VOLTAREN) 1 % GEL, Apply 2 g topically 4 (four) times daily. (Patient taking differently: Apply 2 g topically 4 (four) times daily as needed. ), Disp: 3 Tube, Rfl: 1 .  DULoxetine (CYMBALTA) 20 MG capsule, Take 1 capsule (20 mg total) by mouth daily., Disp: 30 capsule, Rfl: 0 .  ferrous sulfate 325 (65 FE) MG tablet, Take 1 tablet (325 mg total) by mouth daily with breakfast., Disp: 90 tablet, Rfl: 3 .  Insulin Glargine (LANTUS SOLOSTAR) 100 UNIT/ML Solostar Pen, Inject 15 Units into the skin  at bedtime. (Patient taking differently: Inject 10 Units into the skin at bedtime. ), Disp: 5 pen, Rfl: PRN .  losartan (COZAAR) 100 MG tablet, Take 1 tablet (100 mg total) by mouth daily., Disp: 90 tablet, Rfl: 1 .  megestrol (MEGACE) 40 MG tablet, 3 tablets a day for 5 days, 2 tablets a day for 5 days then 1 tablet daily (Patient taking differently: Take 40 mg by mouth daily. 3 tablets a day for 5 days, 2 tablets a day for 5 days then 1 tablet daily), Disp: 45 tablet, Rfl: 3 .  metFORMIN (GLUCOPHAGE) 1000 MG tablet, Take 1 tablet (1,000 mg total) by mouth 2 (two) times daily with a meal., Disp: 180 tablet, Rfl: 1   Review of Systems  Constitutional: Positive for diaphoresis and fatigue. Negative for appetite change, chills, fever and unexpected weight change.  HENT: Negative for congestion, dental problem, drooling, ear pain, facial swelling, hearing loss, mouth sores, sneezing, sore throat, trouble swallowing and voice change.   Eyes: Positive for pain (R), discharge (R), redness (R), itching (R) and visual disturbance (R).  Respiratory: Negative for cough, choking, shortness of breath and wheezing.   Cardiovascular: Negative for chest pain, palpitations and leg swelling.  Gastrointestinal: Negative for abdominal pain, blood in  stool, constipation, diarrhea and vomiting.  Endocrine: Negative for cold intolerance, heat intolerance and polydipsia.  Genitourinary: Negative for decreased urine volume, dysuria and hematuria.  Musculoskeletal: Negative for arthralgias, back pain and gait problem.  Skin: Negative for rash.  Allergic/Immunologic: Negative for environmental allergies.  Neurological: Positive for headaches. Negative for seizures, syncope and light-headedness.  Hematological: Negative for adenopathy.  Psychiatric/Behavioral: Positive for agitation. Negative for dysphoric mood and suicidal ideas. The patient is not nervous/anxious.     Per HPI unless specifically indicated above      Objective:    BP (!) 180/95 (BP Location: Right Arm, Patient Position: Sitting, Cuff Size: Normal)   Pulse 93   Temp 97.7 F (36.5 C)   Ht 5\' 6"  (1.676 m)   Wt 200 lb (90.7 kg)   SpO2 100%   BMI 32.28 kg/m   Wt Readings from Last 3 Encounters:  04/25/18 200 lb (90.7 kg)  03/21/18 199 lb 12 oz (90.6 kg)  02/21/18 202 lb (91.6 kg)    Physical Exam  Constitutional: She is oriented to person, place, and time. She appears well-developed and well-nourished.  HENT:  Head: Normocephalic and atraumatic.  Right Ear: Hearing, tympanic membrane, external ear and ear canal normal.  Left Ear: Hearing, tympanic membrane, external ear and ear canal normal.  Nose: Right sinus exhibits frontal sinus tenderness.  Mouth/Throat: Uvula is midline and oropharynx is clear and moist. No oropharyngeal exudate.  Tenderness above and around R eye.  No swelling or redness.    Eyes: Pupils are equal, round, and reactive to light. Conjunctivae and EOM are normal.  Neck: Neck supple.  Cardiovascular: Normal rate and regular rhythm.  Pulmonary/Chest: Effort normal and breath sounds normal. She has no wheezes.  Abdominal: Soft. Bowel sounds are normal. She exhibits no mass. There is no hepatosplenomegaly. There is no tenderness.  Musculoskeletal: She exhibits no edema.  Lymphadenopathy:    She has no cervical adenopathy.  Neurological: She is alert and oriented to person, place, and time.  Skin: Skin is warm and dry.  Psychiatric: She has a normal mood and affect. Her behavior is normal.  Vitals reviewed.   Results for orders placed or performed during the hospital encounter of 04/18/18  Lipid panel  Result Value Ref Range   Cholesterol 87 0 - 200 mg/dL   Triglycerides 47 <150 mg/dL   HDL 26 (L) >40 mg/dL   Total CHOL/HDL Ratio 3.3 RATIO   VLDL 9 0 - 40 mg/dL   LDL Cholesterol 52 0 - 99 mg/dL  Comprehensive metabolic panel  Result Value Ref Range   Sodium 140 135 - 145 mmol/L   Potassium 3.8 3.5 -  5.1 mmol/L   Chloride 110 98 - 111 mmol/L   CO2 21 (L) 22 - 32 mmol/L   Glucose, Bld 146 (H) 70 - 99 mg/dL   BUN 9 6 - 20 mg/dL   Creatinine, Ser 0.53 0.44 - 1.00 mg/dL   Calcium 9.1 8.9 - 10.3 mg/dL   Total Protein 7.2 6.5 - 8.1 g/dL   Albumin 3.3 (L) 3.5 - 5.0 g/dL   AST 12 (L) 15 - 41 U/L   ALT 10 0 - 44 U/L   Alkaline Phosphatase 67 38 - 126 U/L   Total Bilirubin 0.4 0.3 - 1.2 mg/dL   GFR calc non Af Amer >60 >60 mL/min   GFR calc Af Amer >60 >60 mL/min   Anion gap 9 5 - 15  Hemoglobin and hematocrit, blood  Result  Value Ref Range   Hemoglobin 13.1 12.0 - 15.0 g/dL   HCT 40.1 36.0 - 46.0 %  Hemoglobin A1c  Result Value Ref Range   Hgb A1c MFr Bld 8.2 (H) 4.8 - 5.6 %   Mean Plasma Glucose 189 mg/dL      Assessment & Plan:   Encounter Diagnoses  Name Primary?  . Essential hypertension Yes  . Uncontrolled type 2 diabetes mellitus with hyperglycemia ([place])   . Sinusitis, unspecified chronicity, unspecified location   . Dyslipidemia     -reviewed labs with pt -rx augmenitn for sinusitis- if pain persists after this, will look for ct or MRI -pt to Increase insulin to 15 units.  Monitor fbs.  She is reminded to call office for fbs < 70 or > 300.  She is to watch diabetic diet -Add metoprolol for blood pressure -pt to follow up next week.  She is reminded to bring all of her medications

## 2018-05-04 ENCOUNTER — Ambulatory Visit: Payer: Self-pay | Admitting: Physician Assistant

## 2018-05-04 ENCOUNTER — Encounter: Payer: Self-pay | Admitting: Physician Assistant

## 2018-05-04 VITALS — BP 170/90 | HR 78 | Temp 97.2°F | Ht 66.0 in | Wt 203.0 lb

## 2018-05-04 DIAGNOSIS — E1165 Type 2 diabetes mellitus with hyperglycemia: Secondary | ICD-10-CM

## 2018-05-04 DIAGNOSIS — I10 Essential (primary) hypertension: Secondary | ICD-10-CM

## 2018-05-04 MED ORDER — METOPROLOL TARTRATE 50 MG PO TABS: 100.0000 mg | ORAL_TABLET | Freq: Two times a day (BID) | ORAL | 3 refills | Status: DC

## 2018-05-04 NOTE — Progress Notes (Signed)
BP (!) 170/90 (BP Location: Left Arm, Patient Position: Sitting, Cuff Size: Normal)   Pulse 78   Temp (!) 97.2 F (36.2 C)   Ht 5\' 6"  (1.676 m)   Wt 203 lb (92.1 kg)   SpO2 99%   BMI 32.77 kg/m    Subjective:    Patient ID: Caitlin Fritz, female    DOB: 1971/07/08, 47 y.o.   MRN: [id number]  HPI: Caitlin Fritz is a 47 y.o. female presenting on 05/04/2018 for Diabetes and Hypertension   HPI   She checks her bp at home runs about 160  bs log range 149-91   Relevant past medical, surgical, family and social history reviewed and updated as indicated. Interim medical history since our last visit reviewed. Allergies and medications reviewed and updated.   Current Outpatient Medications:  .  amLODipine (NORVASC) 10 MG tablet, Take 1 tablet (10 mg total) by mouth daily., Disp: 90 tablet, Rfl: 3 .  amoxicillin-clavulanate (AUGMENTIN) 875-125 MG tablet, Take 1 tablet by mouth 2 (two) times daily., Disp: 20 tablet, Rfl: 0 .  aspirin 325 MG tablet, Take 325 mg by mouth daily., Disp: , Rfl:  .  atorvastatin (LIPITOR) 40 MG tablet, Take 1 tablet (40 mg total) by mouth daily., Disp: 90 tablet, Rfl: 1 .  clopidogrel (PLAVIX) 75 MG tablet, Take 1 tablet (75 mg total) by mouth daily., Disp: 30 tablet, Rfl: 0 .  diclofenac sodium (VOLTAREN) 1 % GEL, Apply 2 g topically 4 (four) times daily. (Patient taking differently: Apply 2 g topically 4 (four) times daily as needed. ), Disp: 3 Tube, Rfl: 1 .  DULoxetine (CYMBALTA) 20 MG capsule, Take 1 capsule (20 mg total) by mouth daily., Disp: 30 capsule, Rfl: 0 .  ferrous sulfate 325 (65 FE) MG tablet, Take 1 tablet (325 mg total) by mouth daily with breakfast., Disp: 90 tablet, Rfl: 3 .  Insulin Glargine (LANTUS SOLOSTAR) 100 UNIT/ML Solostar Pen, Inject 15 Units into the skin at bedtime., Disp: 5 pen, Rfl: PRN .  losartan (COZAAR) 100 MG tablet, Take 1 tablet (100 mg total) by mouth daily., Disp: 90 tablet, Rfl: 1 .  megestrol (MEGACE) 40 MG  tablet, 3 tablets a day for 5 days, 2 tablets a day for 5 days then 1 tablet daily (Patient taking differently: Take 40 mg by mouth daily. 3 tablets a day for 5 days, 2 tablets a day for 5 days then 1 tablet daily), Disp: 45 tablet, Rfl: 3 .  metFORMIN (GLUCOPHAGE) 1000 MG tablet, Take 1 tablet (1,000 mg total) by mouth 2 (two) times daily with a meal., Disp: 180 tablet, Rfl: 1 .  metoprolol tartrate (LOPRESSOR) 50 MG tablet, Take 1 tablet (50 mg total) by mouth 2 (two) times daily., Disp: 60 tablet, Rfl: 3   Review of Systems  Constitutional: Negative for appetite change, chills, diaphoresis, fatigue, fever and unexpected weight change.  HENT: Negative for congestion, dental problem, drooling, ear pain, facial swelling, hearing loss, mouth sores, sneezing, sore throat, trouble swallowing and voice change.   Eyes: Negative for pain, discharge, redness, itching and visual disturbance.  Respiratory: Negative for cough, choking, shortness of breath and wheezing.   Cardiovascular: Negative for chest pain, palpitations and leg swelling.  Gastrointestinal: Negative for abdominal pain, blood in stool, constipation, diarrhea and vomiting.  Endocrine: Negative for cold intolerance, heat intolerance and polydipsia.  Genitourinary: Negative for decreased urine volume, dysuria and hematuria.  Musculoskeletal: Negative for arthralgias, back pain and gait problem.  Skin: Negative  for rash.  Allergic/Immunologic: Negative for environmental allergies.  Neurological: Negative for seizures, syncope, light-headedness and headaches.  Hematological: Negative for adenopathy.  Psychiatric/Behavioral: Negative for agitation, dysphoric mood and suicidal ideas. The patient is not nervous/anxious.     Per HPI unless specifically indicated above     Objective:    BP (!) 170/90 (BP Location: Left Arm, Patient Position: Sitting, Cuff Size: Normal)   Pulse 78   Temp (!) 97.2 F (36.2 C)   Ht 5\' 6"  (1.676 m)   Wt  203 lb (92.1 kg)   SpO2 99%   BMI 32.77 kg/m   Wt Readings from Last 3 Encounters:  05/04/18 203 lb (92.1 kg)  04/25/18 200 lb (90.7 kg)  03/21/18 199 lb 12 oz (90.6 kg)    Physical Exam  Constitutional: She is oriented to person, place, and time. She appears well-developed and well-nourished.  HENT:  Head: Normocephalic and atraumatic.  Neck: Neck supple.  Cardiovascular: Normal rate and regular rhythm.  Pulmonary/Chest: Effort normal and breath sounds normal.  Abdominal: Soft. Bowel sounds are normal. She exhibits no mass. There is no hepatosplenomegaly. There is no tenderness.  Musculoskeletal: She exhibits no edema.  Lymphadenopathy:    She has no cervical adenopathy.  Neurological: She is alert and oriented to person, place, and time.  Skin: Skin is warm and dry.  Psychiatric: She has a normal mood and affect. Her behavior is normal.  Vitals reviewed.         Assessment & Plan:   Encounter Diagnoses  Name Primary?  . Essential hypertension Yes  . Uncontrolled type 2 diabetes mellitus with hyperglycemia ([facility])     -Continue with lantus 15 units and continue to monitor the fbs.  Pt to call office for highs or lows (> 300 or < 70) -for blood pressure will continue losartan and amlodipine and increase metoprolol -Follow up 4 weeks.  RTO sooner prn

## 2018-05-16 ENCOUNTER — Ambulatory Visit (INDEPENDENT_AMBULATORY_CARE_PROVIDER_SITE_OTHER): Payer: 59

## 2018-05-16 ENCOUNTER — Encounter: Payer: Self-pay | Admitting: Obstetrics & Gynecology

## 2018-05-16 ENCOUNTER — Ambulatory Visit: Payer: 59 | Admitting: Obstetrics & Gynecology

## 2018-05-16 VITALS — BP 148/86 | HR 84 | Ht 64.0 in | Wt 204.5 lb

## 2018-05-16 DIAGNOSIS — N921 Excessive and frequent menstruation with irregular cycle: Secondary | ICD-10-CM

## 2018-05-16 DIAGNOSIS — N946 Dysmenorrhea, unspecified: Secondary | ICD-10-CM | POA: Diagnosis not present

## 2018-05-16 DIAGNOSIS — D62 Acute posthemorrhagic anemia: Secondary | ICD-10-CM

## 2018-05-16 DIAGNOSIS — D219 Benign neoplasm of connective and other soft tissue, unspecified: Secondary | ICD-10-CM

## 2018-05-16 MED ORDER — MEGESTROL ACETATE 40 MG PO TABS: ORAL_TABLET | ORAL | 3 refills | Status: DC

## 2018-05-16 NOTE — Progress Notes (Signed)
Follow up appointment for results  Chief Complaint  Patient presents with  . Follow-up    [place] today    Blood pressure (!) 148/86, pulse 84, height 5\' 4"  (1.626 m), weight 204 lb 8 oz (92.8 kg), last menstrual period 01/31/2018.    Caitlin Fritz is a 47 y.o. G1P0100 unknown LMP she is here for a pelvic sonogram for menometrorrhagia,dysmenorrhea.  Uterus                      18.3 x 11.7 x 13.8 cm, enlarged heterogeneous uterus w/mult fibroids,uterine volume is approximally 1553 ml  Endometrium          23 mm, symmetrical, thickened endometrium, appears to be distorted by fibroids   Right ovary             3.4 x 2.8 x 3.7 cm, wnl,limited view  Left ovary                3.3 x 2.8 x 2.6 cm, wnl,limited view  Fibroids                  (#1) calcified fundal fibroid  12 x 8 x 6.1 cm,(#2) calcified submucosal fibroid right uterus 9.6 x  9.8 x 7.9 cm,(#3) ? submucosal posterior right fibroid 7.5 x 6.5 x 7 cm,(#4) subserosal anterior left 4.8 x 5.4 x 5.2 cm  Technician Comments:  PELVIC US: TA only because of fibroids,enlarged heterogeneous uterus w/mult fibroids,uterine volume is approximally 1553 ml, (#1) calcified fundal fibroid  12 x 8 x 6.1 cm,(#2) calcified submucosal fibroid right uterus 9.6 x  9.8 x 7.9 cm,(#3) ? submucosal posterior right fibroid 7.5 x 6.5 x 7 cm,(#4) subserosal anterior left 4.8 x 5.4 x 5.2 cm,normal ovaries bilat,thickened endometrium 23 mm,endometrium appears to be distorted by fibroids     Silver Huguenin 05/16/2018 2:18 PM  Clinical Impression and recommendations:  I have reviewed the sonogram results above, combined with the patient's current clinical course, below are my impressions and any appropriate recommendations for management based on the sonographic findings.  Enlarged fibroid uterus with thickened endometrium due to chronic megestrol therapy Ovaries appear normal   Florian Buff 05/17/2018 7:24 AM   MEDS ordered this  encounter: Meds ordered this encounter  Medications  . megestrol (MEGACE) 40 MG tablet    Sig: 2 tablets daily    Dispense:  60 tablet    Refill:  3    Orders for this encounter: No orders of the defined types were placed in this encounter.   Impression: Menometrorrhagia  Dysmenorrhea  Acute on chronic blood loss anemia  Fibroids    Plan: Patient needs a hysterectomy She has a 22-week size fibroid uterus She needs some time additionally to process this in the meantime she will stay on Megace to try to manage her bleeding although she knows that this is not a long-term solution for her significantly enlarged fibroid uterus  Follow Up: Return in about 1 month (around 06/16/2018) for Follow up, with Dr Elonda Husky.       Face to face time:  15 minutes  Greater than 50% of the visit time was spent in counseling and coordination of care with the patient.  The summary and outline of the counseling and care coordination is summarized in the note above.   All questions were answered.  Past Medical History:  Diagnosis Date  . Asthma   . Bronchitis   . COPD (chronic obstructive pulmonary disease) (  [place])   . Depression   . Diabetes mellitus without complication [facility])    diagnosed age 26  . GERD (gastroesophageal reflux disease)   . Hydradenitis   . Hypertension   . Seizures ([place])    once after taking clonidine  . Stroke [facility])    mini stroke June 2015, regular stroke Jan. 29, 2019    Past Surgical History:  Procedure Laterality Date  . INCISE AND DRAIN ABCESS     right inner thigh  . INCISION AND DRAINAGE  2013   bilateral, hydradenitis done at [facility]    OB History    Gravida  1   Para  1   Term  0   Preterm  1   AB  0   Living        SAB  0   TAB  0   Ectopic  0   Multiple      Live Births  1           Allergies  Allergen Reactions  . Clonidine Derivatives Other (See Comments)    Seizures   . Clonidine Derivatives Other (See  Comments)    seizures  . Prednisone Other (See Comments)    Eye drops only-temporary blindness Chemical burn in eyes   . Adhesive [Tape] Rash  . Eye Drops [Tetrahydrozoline] Rash    Social History   Socioeconomic History  . Marital status: Married    Spouse name: Not on file  . Number of children: Not on file  . Years of education: Not on file  . Highest education level: Not on file  Occupational History  . Not on file  Social Needs  . Financial resource strain: Not on file  . Food insecurity:    Worry: Not on file    Inability: Not on file  . Transportation needs:    Medical: Not on file    Non-medical: Not on file  Tobacco Use  . Smoking status: Former Smoker    Packs/day: 0.50    Years: 20.00    Pack years: 10.00    Types: Cigarettes    Last attempt to quit: 11/01/2017    Years since quitting: 0.5  . Smokeless tobacco: Never Used  Substance and Sexual Activity  . Alcohol use: No    Comment: occ  . Drug use: No  . Sexual activity: Yes    Birth control/protection: None  Lifestyle  . Physical activity:    Days per week: Not on file    Minutes per session: Not on file  . Stress: Not on file  Relationships  . Social connections:    Talks on phone: Not on file    Gets together: Not on file    Attends religious service: Not on file    Active member of club or organization: Not on file    Attends meetings of clubs or organizations: Not on file    Relationship status: Not on file  Other Topics Concern  . Not on file  Social History Narrative   ** Merged History Encounter **        Family History  Problem Relation Age of Onset  . Hyperlipidemia Mother   . Hypertension Mother   . Thyroid disease Mother   . Diabetes Father   . Heart disease Father   . Hypertension Father   . Cancer Maternal Grandmother        cervical cancer  . Diabetes Maternal Grandfather   . Diabetes Paternal  Grandmother   . Pneumonia Sister   . Preterm labor Son

## 2018-05-16 NOTE — Progress Notes (Signed)
PELVIC US: TA only because of fibroids,enlarged heterogeneous uterus w/mult fibroids,uterine volume is approximally 1553 ml, (#1) calcified fundal fibroid  12 x 8 x 6.1 cm,(#2) calcified submucosal fibroid right uterus 9.6 x  9.8 x 7.9 cm,(#3) ? submucosal posterior right fibroid 7.5 x 6.5 x 7 cm,(#4) subserosal anterior left 4.8 x 5.4 x 5.2 cm,normal ovaries bilat,thickened endometrium 23 mm,endometrium appears to be distorted by fibroids

## 2018-06-06 ENCOUNTER — Encounter: Payer: Self-pay | Admitting: Physician Assistant

## 2018-06-06 ENCOUNTER — Ambulatory Visit: Payer: Self-pay | Admitting: Physician Assistant

## 2018-06-06 VITALS — BP 154/80 | HR 70 | Temp 97.3°F | Ht 64.0 in | Wt 204.5 lb

## 2018-06-06 DIAGNOSIS — E785 Hyperlipidemia, unspecified: Secondary | ICD-10-CM

## 2018-06-06 DIAGNOSIS — I10 Essential (primary) hypertension: Secondary | ICD-10-CM

## 2018-06-06 DIAGNOSIS — Z7901 Long term (current) use of anticoagulants: Secondary | ICD-10-CM

## 2018-06-06 DIAGNOSIS — E1165 Type 2 diabetes mellitus with hyperglycemia: Secondary | ICD-10-CM

## 2018-06-06 DIAGNOSIS — E669 Obesity, unspecified: Secondary | ICD-10-CM

## 2018-06-06 MED ORDER — METOPROLOL TARTRATE 100 MG PO TABS: 100.0000 mg | ORAL_TABLET | Freq: Two times a day (BID) | ORAL | 3 refills | Status: DC

## 2018-06-06 NOTE — Patient Instructions (Signed)
DASH Eating Plan DASH stands for "Dietary Approaches to Stop Hypertension." The DASH eating plan is a healthy eating plan that has been shown to reduce high blood pressure (hypertension). It may also reduce your risk for type 2 diabetes, heart disease, and stroke. The DASH eating plan may also help with weight loss. What are tips for following this plan? General guidelines  Avoid eating more than 2,300 mg (milligrams) of salt (sodium) a day. If you have hypertension, you may need to reduce your sodium intake to 1,500 mg a day.  Limit alcohol intake to no more than 1 drink a day for nonpregnant women and 2 drinks a day for men. One drink equals 12 oz of beer, 5 oz of wine, or 1 oz of hard liquor.  Work with your health care provider to maintain a healthy body weight or to lose weight. Ask what an ideal weight is for you.  Get at least 30 minutes of exercise that causes your heart to beat faster (aerobic exercise) most days of the week. Activities may include walking, swimming, or biking.  Work with your health care provider or diet and nutrition specialist (dietitian) to adjust your eating plan to your individual calorie needs. Reading food labels  Check food labels for the amount of sodium per serving. Choose foods with less than 5 percent of the Daily Value of sodium. Generally, foods with less than 300 mg of sodium per serving fit into this eating plan.  To find whole grains, look for the word "whole" as the first word in the ingredient list. Shopping  Buy products labeled as "low-sodium" or "no salt added."  Buy fresh foods. Avoid canned foods and premade or frozen meals. Cooking  Avoid adding salt when cooking. Use salt-free seasonings or herbs instead of table salt or sea salt. Check with your health care provider or pharmacist before using salt substitutes.  Do not fry foods. Cook foods using healthy methods such as baking, boiling, grilling, and broiling instead.  Cook with  heart-healthy oils, such as olive, canola, soybean, or sunflower oil. Meal planning   Eat a balanced diet that includes: ? 5 or more servings of fruits and vegetables each day. At each meal, try to fill half of your plate with fruits and vegetables. ? Up to 6-8 servings of whole grains each day. ? Less than 6 oz of lean meat, poultry, or fish each day. A 3-oz serving of meat is about the same size as a deck of cards. One egg equals 1 oz. ? 2 servings of low-fat dairy each day. ? A serving of nuts, seeds, or beans 5 times each week. ? Heart-healthy fats. Healthy fats called Omega-3 fatty acids are found in foods such as flaxseeds and coldwater fish, like sardines, salmon, and mackerel.  Limit how much you eat of the following: ? Canned or prepackaged foods. ? Food that is high in trans fat, such as fried foods. ? Food that is high in saturated fat, such as fatty meat. ? Sweets, desserts, sugary drinks, and other foods with added sugar. ? Full-fat dairy products.  Do not salt foods before eating.  Try to eat at least 2 vegetarian meals each week.  Eat more home-cooked food and less restaurant, buffet, and fast food.  When eating at a restaurant, ask that your food be prepared with less salt or no salt, if possible. What foods are recommended? The items listed may not be a complete list. Talk with your dietitian about what   dietary choices are best for you. Grains Whole-grain or whole-wheat bread. Whole-grain or whole-wheat pasta. Brown rice. Oatmeal. Quinoa. Bulgur. Whole-grain and low-sodium cereals. Pita bread. Low-fat, low-sodium crackers. Whole-wheat flour tortillas. Vegetables Fresh or frozen vegetables (raw, steamed, roasted, or grilled). Low-sodium or reduced-sodium tomato and vegetable juice. Low-sodium or reduced-sodium tomato sauce and tomato paste. Low-sodium or reduced-sodium canned vegetables. Fruits All fresh, dried, or frozen fruit. Canned fruit in natural juice (without  added sugar). Meat and other protein foods Skinless chicken or turkey. Ground chicken or turkey. Pork with fat trimmed off. Fish and seafood. Egg whites. Dried beans, peas, or lentils. Unsalted nuts, nut butters, and seeds. Unsalted canned beans. Lean cuts of beef with fat trimmed off. Low-sodium, lean deli meat. Dairy Low-fat (1%) or fat-free (skim) milk. Fat-free, low-fat, or reduced-fat cheeses. Nonfat, low-sodium ricotta or cottage cheese. Low-fat or nonfat yogurt. Low-fat, low-sodium cheese. Fats and oils Soft margarine without trans fats. Vegetable oil. Low-fat, reduced-fat, or light mayonnaise and salad dressings (reduced-sodium). Canola, safflower, olive, soybean, and sunflower oils. Avocado. Seasoning and other foods Herbs. Spices. Seasoning mixes without salt. Unsalted popcorn and pretzels. Fat-free sweets. What foods are not recommended? The items listed may not be a complete list. Talk with your dietitian about what dietary choices are best for you. Grains Baked goods made with fat, such as croissants, muffins, or some breads. Dry pasta or rice meal packs. Vegetables Creamed or fried vegetables. Vegetables in a cheese sauce. Regular canned vegetables (not low-sodium or reduced-sodium). Regular canned tomato sauce and paste (not low-sodium or reduced-sodium). Regular tomato and vegetable juice (not low-sodium or reduced-sodium). Pickles. Olives. Fruits Canned fruit in a light or heavy syrup. Fried fruit. Fruit in cream or butter sauce. Meat and other protein foods Fatty cuts of meat. Ribs. Fried meat. Bacon. Sausage. Bologna and other processed lunch meats. Salami. Fatback. Hotdogs. Bratwurst. Salted nuts and seeds. Canned beans with added salt. Canned or smoked fish. Whole eggs or egg yolks. Chicken or turkey with skin. Dairy Whole or 2% milk, cream, and half-and-half. Whole or full-fat cream cheese. Whole-fat or sweetened yogurt. Full-fat cheese. Nondairy creamers. Whipped toppings.  Processed cheese and cheese spreads. Fats and oils Butter. Stick margarine. Lard. Shortening. Ghee. Bacon fat. Tropical oils, such as coconut, palm kernel, or palm oil. Seasoning and other foods Salted popcorn and pretzels. Onion salt, garlic salt, seasoned salt, table salt, and sea salt. Worcestershire sauce. Tartar sauce. Barbecue sauce. Teriyaki sauce. Soy sauce, including reduced-sodium. Steak sauce. Canned and packaged gravies. Fish sauce. Oyster sauce. Cocktail sauce. Horseradish that you find on the shelf. Ketchup. Mustard. Meat flavorings and tenderizers. Bouillon cubes. Hot sauce and Tabasco sauce. Premade or packaged marinades. Premade or packaged taco seasonings. Relishes. Regular salad dressings. Where to find more information:  National Heart, Lung, and Blood Institute: www.nhlbi.nih.gov  American Heart Association: www.heart.org Summary  The DASH eating plan is a healthy eating plan that has been shown to reduce high blood pressure (hypertension). It may also reduce your risk for type 2 diabetes, heart disease, and stroke.  With the DASH eating plan, you should limit salt (sodium) intake to 2,300 mg a day. If you have hypertension, you may need to reduce your sodium intake to 1,500 mg a day.  When on the DASH eating plan, aim to eat more fresh fruits and vegetables, whole grains, lean proteins, low-fat dairy, and heart-healthy fats.  Work with your health care provider or diet and nutrition specialist (dietitian) to adjust your eating plan to your individual   calorie needs. This information is not intended to replace advice given to you by your health care provider. Make sure you discuss any questions you have with your health care provider. Document Released: 09/09/2011 Document Revised: 09/13/2016 Document Reviewed: 09/13/2016 Elsevier Interactive Patient Education  2018 Elsevier Inc.  

## 2018-06-06 NOTE — Progress Notes (Signed)
BP (!) 154/80   Pulse 70   Temp (!) 97.3 F (36.3 C) (Other (Comment))   Ht 5\' 4"  (1.626 m)   Wt 204 lb 8 oz (92.8 kg)   LMP 11/06/2017 (Approximate) Comment: has had period since 2-19, Dr Elonda Husky says needs hysterectomy  SpO2 98%   BMI 35.10 kg/m    Subjective:    Patient ID: Caitlin Fritz, female    DOB: July 11, 1971, 47 y.o.   MRN: [id number]  HPI: Caitlin Fritz is a 47 y.o. female presenting on 06/06/2018 for Hypertension   HPI   Pt is Checking her bp at home - lowest 132/80 highest 148.  She is feeling well.   She is having stress associated with her menses and recommended hysterectomy.  She has follow-up scheduled with dr Elonda Husky next week to address this.    Relevant past medical, surgical, family and social history reviewed and updated as indicated. Interim medical history since our last visit reviewed. Allergies and medications reviewed and updated.   Current Outpatient Medications:  .  amLODipine (NORVASC) 10 MG tablet, Take 1 tablet (10 mg total) by mouth daily., Disp: 90 tablet, Rfl: 3 .  aspirin 325 MG tablet, Take 325 mg by mouth daily., Disp: , Rfl:  .  atorvastatin (LIPITOR) 40 MG tablet, Take 1 tablet (40 mg total) by mouth daily., Disp: 90 tablet, Rfl: 1 .  clopidogrel (PLAVIX) 75 MG tablet, Take 1 tablet (75 mg total) by mouth daily., Disp: 30 tablet, Rfl: 0 .  diclofenac sodium (VOLTAREN) 1 % GEL, Apply 2 g topically 4 (four) times daily., Disp: 3 Tube, Rfl: 1 .  DULoxetine (CYMBALTA) 20 MG capsule, Take 1 capsule (20 mg total) by mouth daily., Disp: 30 capsule, Rfl: 0 .  ferrous sulfate 325 (65 FE) MG tablet, Take 1 tablet (325 mg total) by mouth daily with breakfast., Disp: 90 tablet, Rfl: 3 .  Insulin Glargine (LANTUS SOLOSTAR) 100 UNIT/ML Solostar Pen, Inject 15 Units into the skin at bedtime., Disp: 5 pen, Rfl: PRN .  losartan (COZAAR) 100 MG tablet, Take 1 tablet (100 mg total) by mouth daily., Disp: 90 tablet, Rfl: 1 .  megestrol (MEGACE) 40 MG tablet, 3  tablets a day for 5 days, 2 tablets a day for 5 days then 1 tablet daily (Patient taking differently: Take 40 mg by mouth daily. 3 tablets a day for 5 days, 2 tablets a day for 5 days then 1 tablet daily), Disp: 45 tablet, Rfl: 3 .  megestrol (MEGACE) 40 MG tablet, 2 tablets daily, Disp: 60 tablet, Rfl: 3 .  metFORMIN (GLUCOPHAGE) 1000 MG tablet, Take 1 tablet (1,000 mg total) by mouth 2 (two) times daily with a meal., Disp: 180 tablet, Rfl: 1 .  metoprolol tartrate (LOPRESSOR) 50 MG tablet, Take 2 tablets (100 mg total) by mouth 2 (two) times daily., Disp: 60 tablet, Rfl: 3  Review of Systems  Constitutional: Negative for appetite change, chills, diaphoresis, fatigue, fever and unexpected weight change.  HENT: Negative for congestion, dental problem, drooling, ear pain, facial swelling, hearing loss, mouth sores, sneezing, sore throat, trouble swallowing and voice change.   Eyes: Negative for pain, discharge, redness, itching and visual disturbance.  Respiratory: Negative for cough, choking, shortness of breath and wheezing.   Cardiovascular: Negative for chest pain, palpitations and leg swelling.  Gastrointestinal: Negative for abdominal pain, blood in stool, constipation, diarrhea and vomiting.  Endocrine: Negative for cold intolerance, heat intolerance and polydipsia.  Genitourinary: Negative for decreased urine volume,  dysuria and hematuria.  Musculoskeletal: Negative for arthralgias, back pain and gait problem.  Skin: Negative for rash.  Allergic/Immunologic: Negative for environmental allergies.  Neurological: Negative for seizures, syncope, light-headedness and headaches.  Hematological: Negative for adenopathy.  Psychiatric/Behavioral: Negative for agitation, dysphoric mood and suicidal ideas. The patient is not nervous/anxious.     Per HPI unless specifically indicated above     Objective:    BP (!) 154/80   Pulse 70   Temp (!) 97.3 F (36.3 C) (Other (Comment))   Ht 5\' 4"   (1.626 m)   Wt 204 lb 8 oz (92.8 kg)   LMP 11/06/2017 (Approximate) Comment: has had period since 2-19, Dr Elonda Husky says needs hysterectomy  SpO2 98%   BMI 35.10 kg/m   Wt Readings from Last 3 Encounters:  06/06/18 204 lb 8 oz (92.8 kg)  05/16/18 204 lb 8 oz (92.8 kg)  05/04/18 203 lb (92.1 kg)    Physical Exam  Constitutional: She is oriented to person, place, and time. She appears well-developed and well-nourished.  HENT:  Head: Normocephalic and atraumatic.  Neck: Neck supple.  Cardiovascular: Normal rate and regular rhythm.  Pulmonary/Chest: Effort normal and breath sounds normal.  Abdominal: Soft. Bowel sounds are normal. She exhibits no mass. There is no hepatosplenomegaly. There is no tenderness.  Musculoskeletal: She exhibits no edema.  Lymphadenopathy:    She has no cervical adenopathy.  Neurological: She is alert and oriented to person, place, and time.  Skin: Skin is warm and dry.  Psychiatric: She has a normal mood and affect. Her behavior is normal.  Vitals reviewed.       Assessment & Plan:   Encounter Diagnoses  Name Primary?  . Essential hypertension Yes  . Uncontrolled type 2 diabetes mellitus with hyperglycemia ([place])   . Dyslipidemia   . Obesity, unspecified classification, unspecified obesity type, unspecified whether serious comorbidity present   . Anticoagulant long-term use      -pt is to continue to monitor her blood pressure. She is to notify office if her BP is higher than 150 at home -No changes today in medicaitons -Counseled on lifestyle modifications and gave reading information on DASH diet -Follow up 2 months.  Pt to RTO sooner prn

## 2018-06-13 ENCOUNTER — Encounter: Payer: Self-pay | Admitting: Obstetrics & Gynecology

## 2018-06-13 ENCOUNTER — Other Ambulatory Visit: Payer: Self-pay

## 2018-06-13 ENCOUNTER — Ambulatory Visit (INDEPENDENT_AMBULATORY_CARE_PROVIDER_SITE_OTHER): Payer: 59 | Admitting: Obstetrics & Gynecology

## 2018-06-13 DIAGNOSIS — N921 Excessive and frequent menstruation with irregular cycle: Secondary | ICD-10-CM

## 2018-06-13 DIAGNOSIS — N946 Dysmenorrhea, unspecified: Secondary | ICD-10-CM | POA: Diagnosis not present

## 2018-06-13 DIAGNOSIS — D219 Benign neoplasm of connective and other soft tissue, unspecified: Secondary | ICD-10-CM

## 2018-06-13 MED ORDER — ENOXAPARIN SODIUM 40 MG/0.4ML ~~LOC~~ SOLN: 40.0000 mg | SUBCUTANEOUS | 0 refills | Status: DC

## 2018-06-13 NOTE — Progress Notes (Signed)
Preoperative History and Physical  Caitlin Fritz is a 47 y.o. G1P0100 with No LMP recorded. admitted for a abdominal supracervical hysterectomy with removal of both tubes and ovaries due to 20 week size fibroids with heavy painful periods.  Have anaged on megestrol up until now Pt understands the risks of peri operative risks of clots   PMH:    Past Medical History:  Diagnosis Date  . Asthma   . Bronchitis   . COPD (chronic obstructive pulmonary disease) ([place])   . Depression   . Diabetes mellitus without complication [facility])    diagnosed age 28  . GERD (gastroesophageal reflux disease)   . Hydradenitis   . Hypertension   . Seizures ([place])    once after taking clonidine  . Stroke [facility])    mini stroke June 2015, regular stroke Jan. 29, 2019    PSH:     Past Surgical History:  Procedure Laterality Date  . INCISE AND DRAIN ABCESS     right inner thigh  . INCISION AND DRAINAGE  2013   bilateral, hydradenitis done at [place]:      OB History    Gravida  1   Para  1   Term  0   Preterm  1   AB  0   Living        SAB  0   TAB  0   Ectopic  0   Multiple      Live Births  1           SH:   Social History   Tobacco Use  . Smoking status: Former Smoker    Packs/day: 0.50    Years: 20.00    Pack years: 10.00    Types: Cigarettes    Last attempt to quit: 11/01/2017    Years since quitting: 0.6  . Smokeless tobacco: Never Used  Substance Use Topics  . Alcohol use: No    Comment: occ  . Drug use: No    FH:    Family History  Problem Relation Age of Onset  . Hyperlipidemia Mother   . Hypertension Mother   . Thyroid disease Mother   . Diabetes Father   . Heart disease Father   . Hypertension Father   . Cancer Maternal Grandmother        cervical cancer  . Diabetes Maternal Grandfather   . Diabetes Paternal Grandmother   . Pneumonia Sister   . Preterm labor Son      Allergies:  Allergies  Allergen Reactions  .  Clonidine Derivatives Other (See Comments)    Seizures   . Clonidine Derivatives Other (See Comments)    seizures  . Prednisone Other (See Comments)    Eye drops only-temporary blindness Chemical burn in eyes   . Adhesive [Tape] Rash  . Eye Drops [Tetrahydrozoline] Rash    Medications:       Current Outpatient Medications:  .  amLODipine (NORVASC) 10 MG tablet, Take 1 tablet (10 mg total) by mouth daily., Disp: 90 tablet, Rfl: 3 .  aspirin 325 MG tablet, Take 325 mg by mouth daily., Disp: , Rfl:  .  atorvastatin (LIPITOR) 40 MG tablet, Take 1 tablet (40 mg total) by mouth daily., Disp: 90 tablet, Rfl: 1 .  clopidogrel (PLAVIX) 75 MG tablet, Take 1 tablet (75 mg total) by mouth daily., Disp: 30 tablet, Rfl: 0 .  diclofenac sodium (VOLTAREN) 1 % GEL, Apply 2 g topically 4 (four)  times daily., Disp: 3 Tube, Rfl: 1 .  DULoxetine (CYMBALTA) 20 MG capsule, Take 1 capsule (20 mg total) by mouth daily., Disp: 30 capsule, Rfl: 0 .  ferrous sulfate 325 (65 FE) MG tablet, Take 1 tablet (325 mg total) by mouth daily with breakfast., Disp: 90 tablet, Rfl: 3 .  Insulin Glargine (LANTUS SOLOSTAR) 100 UNIT/ML Solostar Pen, Inject 15 Units into the skin at bedtime., Disp: 5 pen, Rfl: PRN .  losartan (COZAAR) 100 MG tablet, Take 1 tablet (100 mg total) by mouth daily., Disp: 90 tablet, Rfl: 1 .  megestrol (MEGACE) 40 MG tablet, 2 tablets daily, Disp: 60 tablet, Rfl: 3 .  metFORMIN (GLUCOPHAGE) 1000 MG tablet, Take 1 tablet (1,000 mg total) by mouth 2 (two) times daily with a meal., Disp: 180 tablet, Rfl: 1 .  metoprolol tartrate (LOPRESSOR) 100 MG tablet, Take 1 tablet (100 mg total) by mouth 2 (two) times daily., Disp: 60 tablet, Rfl: 3 .  enoxaparin (LOVENOX) 40 MG/0.4ML injection, Inject 0.4 mLs (40 mg total) into the skin daily. Begin 06/23/2018, Disp: 30 Syringe, Rfl: 0 .  megestrol (MEGACE) 40 MG tablet, 3 tablets a day for 5 days, 2 tablets a day for 5 days then 1 tablet daily (Patient taking  differently: Take 40 mg by mouth daily. 3 tablets a day for 5 days, 2 tablets a day for 5 days then 1 tablet daily), Disp: 45 tablet, Rfl: 3  Review of Systems:   Review of Systems  Constitutional: Negative for fever, chills, weight loss, malaise/fatigue and diaphoresis.  HENT: Negative for hearing loss, ear pain, nosebleeds, congestion, sore throat, neck pain, tinnitus and ear discharge.   Eyes: Negative for blurred vision, double vision, photophobia, pain, discharge and redness.  Respiratory: Negative for cough, hemoptysis, sputum production, shortness of breath, wheezing and stridor.   Cardiovascular: Negative for chest pain, palpitations, orthopnea, claudication, leg swelling and PND.  Gastrointestinal: Positive for abdominal pain. Negative for heartburn, nausea, vomiting, diarrhea, constipation, blood in stool and melena.  Genitourinary: Negative for dysuria, urgency, frequency, hematuria and flank pain.  Musculoskeletal: Negative for myalgias, back pain, joint pain and falls.  Skin: Negative for itching and rash.  Neurological: Negative for dizziness, tingling, tremors, sensory change, speech change, focal weakness, seizures, loss of consciousness, weakness and headaches.  Endo/Heme/Allergies: Negative for environmental allergies and polydipsia. Does not bruise/bleed easily.  Psychiatric/Behavioral: Negative for depression, suicidal ideas, hallucinations, memory loss and substance abuse. The patient is not nervous/anxious and does not have insomnia.      PHYSICAL EXAM:  There were no vitals taken for this visit.    Vitals reviewed. Constitutional: She is oriented to person, place, and time. She appears well-developed and well-nourished.  HENT:  Head: Normocephalic and atraumatic.  Right Ear: External ear normal.  Left Ear: External ear normal.  Nose: Nose normal.  Mouth/Throat: Oropharynx is clear and moist.  Eyes: Conjunctivae and EOM are normal. Pupils are equal, round, and  reactive to light. Right eye exhibits no discharge. Left eye exhibits no discharge. No scleral icterus.  Neck: Normal range of motion. Neck supple. No tracheal deviation present. No thyromegaly present.  Cardiovascular: Normal rate, regular rhythm, normal heart sounds and intact distal pulses.  Exam reveals no gallop and no friction rub.   No murmur heard. Respiratory: Effort normal and breath sounds normal. No respiratory distress. She has no wheezes. She has no rales. She exhibits no tenderness.  GI: Soft. Bowel sounds are normal. She exhibits no distension and no mass.  There is tenderness. There is no rebound and no guarding.  Genitourinary:       Vulva is normal without lesions Vagina is pink moist without discharge Cervix normal in appearance and pap is normal Uterus is 20 week size Adnexa is negative with normal sized ovaries by sonogram  Musculoskeletal: Normal range of motion. She exhibits no edema and no tenderness.  Neurological: She is alert and oriented to person, place, and time. She has normal reflexes. She displays normal reflexes. No cranial nerve deficit. She exhibits normal muscle tone. Coordination normal.  Skin: Skin is warm and dry. No rash noted. No erythema. No pallor.  Psychiatric: She has a normal mood and affect. Her behavior is normal. Judgment and thought content normal.    Labs: No results found for this or any previous visit (from the past 336 hour(s)).  EKG: Orders placed or performed during the hospital encounter of 11/02/13  . EKG 12-Lead  . EKG 12-Lead  . EKG 12-Lead  . EKG 12-Lead  . EKG 12-Lead  . EKG 12-Lead  . EKG    Imaging Studies: US Pelvis (transabdominal Only)  Result Date: 05/17/2018 GYNECOLOGIC SONOGRAM Therisa Mennella is a 47 y.o. G1P0100 unknown LMP she is here for a pelvic sonogram for menometrorrhagia,dysmenorrhea. Uterus                      18.3 x 11.7 x 13.8 cm, enlarged heterogeneous uterus w/mult fibroids,uterine volume is  approximally 1553 ml Endometrium          23 mm, symmetrical, thickened endometrium, appears to be distorted by fibroids Right ovary             3.4 x 2.8 x 3.7 cm, wnl,limited view Left ovary                3.3 x 2.8 x 2.6 cm, wnl,limited view Fibroids                  (#1) calcified fundal fibroid  12 x 8 x 6.1 cm,(#2) calcified submucosal fibroid right uterus 9.6 x  9.8 x 7.9 cm,(#3) ? submucosal posterior right fibroid 7.5 x 6.5 x 7 cm,(#4) subserosal anterior left 4.8 x 5.4 x 5.2 cm Technician Comments: PELVIC US: TA only because of fibroids,enlarged heterogeneous uterus w/mult fibroids,uterine volume is approximally 1553 ml, (#1) calcified fundal fibroid  12 x 8 x 6.1 cm,(#2) calcified submucosal fibroid right uterus 9.6 x  9.8 x 7.9 cm,(#3) ? submucosal posterior right fibroid 7.5 x 6.5 x 7 cm,(#4) subserosal anterior left 4.8 x 5.4 x 5.2 cm,normal ovaries bilat,thickened endometrium 23 mm,endometrium appears to be distorted by fibroids Silver Huguenin 05/16/2018 2:18 PM Clinical Impression and recommendations: I have reviewed the sonogram results above, combined with the patient's current clinical course, below are my impressions and any appropriate recommendations for management based on the sonographic findings. Enlarged fibroid uterus with thickened endometrium due to chronic megestrol therapy Ovaries appear normal Florian Buff 05/17/2018 7:24 AM      Assessment: 20 week size fibroid uterus Menometrorrhagia Dysmenorrhea    Patient Active Problem List   Diagnosis Date Noted  . History of ischemic right ICA stroke 01/17/2018  . Obesity (BMI 30.0-34.9) 12/23/2016  . Hypertension associated with diabetes ([place]) 12/01/2016  . Type 2 diabetes mellitus with diabetic neuropathy, unspecified ([place]) 12/01/2016  . STD exposure 08/08/2014  . TIA (transient ischemic attack) 11/02/2013  . Adjustment disorder with mixed anxiety and depressed mood 09/24/2013  .  Diabetic neuropathy, painful ([place]) 07/24/2013   . Dyslipidemia 06/25/2013  . Varicose veins of lower extremities with other complications 70/96/4383  . Asthma, chronic 06/11/2013  . Hidradenitis suppurativa 06/11/2013  . DM2 (diabetes mellitus, type 2) ([place]) 06/11/2013    Plan: Abdominal supracervical hysterectomy with removal of both tubes and ovaries  Pt understands the risks of surgery including but not limited t  excessive bleeding requiring transfusion or reoperation, post-operative infection requiring prolonged hospitalization or re-hospitalization and antibiotic therapy, and damage to other organs including bladder, bowel, ureters and major vessels.  The patient also understands the alternative treatment options which were discussed in full.  All questions were answered.  Mertie Clause Eure 06/13/2018 11:03 AM   Florian Buff 06/13/2018 10:12 AM     Face to face time:  15 minutes  Greater than 50% of the visit time was spent in counseling and coordination of care with the patient.  The summary and outline of the counseling and care coordination is summarized in the note above.   All questions were answered.

## 2018-06-14 ENCOUNTER — Telehealth: Payer: Self-pay | Admitting: Obstetrics & Gynecology

## 2018-06-14 NOTE — Telephone Encounter (Signed)
Stop her aspirin and plavix on 06/21/18, that day will be her last dose  Begin the lovenox the next day, 06/22/2018  Take last dose of lovenox on 07/03/2018

## 2018-06-14 NOTE — Telephone Encounter (Signed)
Pt having surgery on 10-02, Dr Elonda Husky had told her to get off the asprin and plavix but she cant remember when , Pt is aware that Elonda Husky is not is office today and should get a call back tomorrow.

## 2018-06-14 NOTE — Telephone Encounter (Signed)
Patient informed of the following medication instructions per Dr Elonda Husky:  Stop her aspirin and plavix on 06/21/18, that day will be her last dose  Begin the lovenox the next day, 06/22/2018  Take last dose of lovenox on 07/03/2018  Pt verbalized understanding with no further questions.

## 2018-06-21 ENCOUNTER — Telehealth: Payer: Self-pay | Admitting: Physician Assistant

## 2018-06-21 ENCOUNTER — Encounter: Payer: Self-pay | Admitting: Physician Assistant

## 2018-06-21 NOTE — Telephone Encounter (Signed)
Pt was called in reference to her email request for antibiotic.  Discussed with pt that it is appropriate for her to be seen in the office prior to prescribing an antibiotic.  She was offered and appointment to be seen this afternoon.  Pt declined appointment.   Pt was told to call back if she changes her mind

## 2018-06-27 NOTE — Patient Instructions (Signed)
Caitlin Fritz  06/27/2018     @PREFPERIOPPHARMACY @   Your procedure is scheduled on  07/05/2018 .  Report to [facility] at  615   A.M.  Call this number if you have problems the morning of surgery:  [phone]   Remember:  Do not eat or drink after midnight.  You may drink clear liquids until  12 midnight 07/04/2018 .  Clear liquids allowed are:                    Water, Juice (non-citric and without pulp), Carbonated beverages, Clear Tea, Black Coffee only, Plain Jell-O only, Gatorade and Plain Popsicles only    Take these medicines the morning of surgery with A SIP OF WATER  Amlodipine, cymbalta, losartan, metoprolol.    Do not wear jewelry, make-up or nail polish.  Do not wear lotions, powders, or perfumes, or deodorant.  Do not shave 48 hours prior to surgery.  Men may shave face and neck.  Do not bring valuables to the hospital.  [facility] is not responsible for any belongings or valuables.  Contacts, dentures or bridgework may not be worn into surgery.  Leave your suitcase in the car.  After surgery it may be brought to your room.  For patients admitted to the hospital, discharge time will be determined by your treatment team.  Patients discharged the day of surgery will not be allowed to drive home.   Name and phone number of your driver:   family Special instructions:  None Please read over the following fact sheets that you were given. Anesthesia Post-op Instructions and Care and Recovery After Surgery       Bilateral Salpingo-Oophorectomy Bilateral salpingo-oophorectomy is the surgical removal of both fallopian tubes and both ovaries. The ovaries are reproductive organs that produce eggs in women. The fallopian tubes allow eggs to move from the ovaries to the uterus. You may need this procedure if you:  Have had your uterus removed. This procedure is usually done after the uterus is removed.  Have cancer of the fallopian tubes or  ovaries.  Have a high risk of cancer of the fallopian tubes or ovaries.  There are three different techniques that can be used for this procedure:  Open. One large incision will be made in your abdomen.  Laparoscopic. A thin, lighted tube with a small camera on the end (laparoscope) will be used to help perform the procedure. The laparoscope will allow your surgeon to make several small incisions in the abdomen instead of one large incision.  Robot-assisted. A computer will be used to control surgical instruments that are attached to robotic arms. A laparoscope may also be used with this technique.  As a result of this procedure, you will become sterile (unable to become pregnant), and you will go into menopause (no longer able to have menstrual periods). You may develop symptoms of menopause such as hot flashes, night sweats, and mood changes. Your sex drive may also be affected. Tell a health care provider about:  Any allergies you have.  All medicines you are taking, including vitamins, herbs, eye drops, creams, and over-the-counter medicines.  Any problems you or family members have had with anesthetic medicines.  Any blood disorders you have.  Any surgeries you have had.  Any medical conditions you have.  Whether you are pregnant or may be pregnant. What are the risks? Generally, this is a safe procedure.  However, problems may occur, including:  Infection.  Bleeding.  Allergic reactions to medicines.  Damage to other structures or organs.  Blood clots in the legs or lungs.  What happens before the procedure? Staying hydrated Follow instructions from your health care provider about hydration, which may include:  Up to 2 hours before the procedure - you may continue to drink clear liquids, such as water, clear fruit juice, black coffee, and plain tea.  Eating and drinking restrictions Follow instructions from your health care provider about eating and drinking,  which may include:  8 hours before the procedure - stop eating heavy meals or foods such as meat, fried foods, or fatty foods.  6 hours before the procedure - stop eating light meals or foods, such as toast or cereal.  6 hours before the procedure - stop drinking milk or drinks that contain milk.  2 hours before the procedure - stop drinking clear liquids.  Medicines  Ask your health care provider about: ? Changing or stopping your regular medicines. This is especially important if you are taking diabetes medicines or blood thinners. ? Taking medicines such as aspirin and ibuprofen. These medicines can thin your blood. Do not take these medicines before your procedure if your health care provider instructs you not to.  You may be given antibiotic medicine to help prevent infection. General instructions  Do not smoke for at least 2 weeks before your procedure or as told by your health care provider.  You may have an exam or testing.  You may have a blood or urine sample taken.  Ask your health care provider how your surgical site will be marked or identified.  Plan to have someone take you home from the hospital.  If you will be going home right after the procedure, plan to have someone with you for 24 hours. What happens during the procedure?  To reduce your risk of infection: ? Your health care team will wash or sanitize their hands. ? Your skin will be washed with soap. ? Hair may be removed from the surgical area.  An IV tube will be inserted into one of your veins.  You will be given one or more of the following: ? A medicine to help you relax (sedative). ? A medicine to make you fall asleep (general anesthetic).  A thin tube (catheter) will be inserted through your urethra and into your bladder. The catheter drains urine during your procedure.  Depending on the type of surgery you are having, your surgeon will do one of the following: ? Make one incision in your  abdomen (open surgery). ? Make two small incisions in your abdomen (laparoscopic surgery). The laparoscope will be passed through one incision, and surgical instruments will be passed through the other. ? Make several small incisions in your abdomen (robot-assisted surgery). A laparoscope and other surgical instruments may be passed through the incisions.  Your fallopian tubes and ovaries will be cut away from the uterus and removed.  Your blood vessels will be clamped and tied to prevent too much bleeding.  The incision(s) in your abdomen will be closed with stitches (sutures) or staples.  A bandage (dressing) may be placed over your incision(s). The procedure may vary among health care providers and hospitals. What happens after the procedure?  Your blood pressure, heart rate, breathing rate, and blood oxygen level will be monitored until the medicines you were given have worn off.  You may continue to receive fluids and medicines through  an IV tube.  You may continue to have a catheter draining your urine.  You may have to wear compression stockings. These stockings help to prevent blood clots and reduce swelling in your legs.  You will be given pain medicine as needed.  Do not drive for 24 hours if you received a sedative. Summary  Bilateral salpingo-oophorectomy is a procedure to remove both fallopian tubes and both ovaries.  There are three different techniques that can be used for this procedure, including open, laparoscopic, and robotic. Talk with your health care provider about how your procedure will be done.  As a result of this procedure, you will become sterile and you will go into menopause.  Plan to have someone take you home from the hospital. This information is not intended to replace advice given to you by your health care provider. Make sure you discuss any questions you have with your health care provider. Document Released: 09/20/2005 Document Revised:  10/25/2016 Document Reviewed: 10/25/2016 Elsevier Interactive Patient Education  2018 [place].  Abdominal Hysterectomy Abdominal hysterectomy is a surgery to remove your womb (uterus). The womb is the part of your body that holds a growing baby. You may need this procedure if:  You have cancer.  You have growths (tumors or fibroids) in your uterus.  You have long-term (chronic) pain.  You are bleeding.  Your womb has slipped down into your vagina.  You have a condition in which the tissue that lines the womb grows outside of its normal place.  You have an infection in your womb.  You have problems with your period.  You may also need other reproductive parts removed. This will depend on why you need to have the surgery. What happens before the procedure? Staying hydrated Follow instructions from your doctor about hydration. This may include:  Up to 2 hours before the procedure - you may continue to drink clear liquids, such as: ? Water. ? Fruit juice. ? Black coffee. ? Plain tea.  Eating and drinking restrictions Follow instructions from your doctor about eating and drinking. These may include:  8 hours before the procedure - stop eating heavy meals or foods, such as: ? Meat. ? Fried foods. ? Fatty foods.  6 hours before the procedure - stop eating light meals or foods, such as: ? Toast. ? Cereal.  6 hours before the procedure - stop drinking: ? Milk ? Drinks that have milk in them.  2 hours before the procedure - stop drinking clear liquids.  Medicines  Ask your doctor about: ? Changing or stopping your normal medicines. This is important if you take diabetes medicines or blood thinners. ? Taking medicines such as aspirin and ibuprofen. These medicines can thin your blood. Do not take these medicines before your procedure if your doctor tells you not to.  You may be given antibiotic medicine. This can help prevent infection.  You may be asked to take  medicines that help you poop (laxatives). General instructions  Ask your doctor how your surgical site will be marked or identified.  You may be asked to shower with a germ-killing soap.  Plan to have someone take you home from the hospital.  Do not use any products that contain nicotine or tobacco, such as cigarettes and e-cigarettes. If you need help quitting, ask your doctor.  You may have an exam or tests done.  You may have a blood or urine sample taken.  You may need to have an enema to clean  out your rectum and lower colon.  Talk to your doctor about the changes this procedure may cause. These can be physical or emotional. What happens during the procedure?  To lower your risk of infection: ? Your health care team will wash or clean their hands. ? Your skin will be washed with soap. ? Hair may be removed from the surgical area.  An IV tube will be put into one of your veins.  You will be given one or more of the following: ? A medicine to help you relax (sedative). ? A medicine to make you fall asleep (general anesthetic).  Tight-fitting (compression) stockings will be placed on your legs to help with circulation.  A thin, flexible tube (catheter) will be inserted to help drain your urine.  The doctor will make a cut (incision) through the skin in your lower belly. It may go side-to-side or up-and-down.  The doctor will move the body tissues that cover your womb.  The doctor will remove your womb.  The doctor may take out any other parts that need to be removed.  The doctor will control the bleeding.  The doctor will close your cut with stitches (sutures), skin glue, or adhesive strips.  A bandage (dressing) will be placed over the cut. The procedure may vary among doctors and hospitals. What happens after the procedure?  You will be given pain medicine if you need it.  Your blood pressure, heart rate, breathing rate, and blood oxygen level will be watched  until the medicines you were given have worn off.  You will need to stay in the hospital to recover. Ask your doctor how long you will need to stay in the hospital after your procedure.  You may have a liquid diet at first. You will most likely return to your usual diet the day after surgery.  You will still have the urinary catheter in place. It will likely be removed the day after surgery.  You may have to wear compression stockings. These stockings help to prevent blood clots and reduce swelling in your legs.  You will be encouraged to walk as soon as possible. You will also use a device or do breathing exercises to keep your lungs clear.  You may need to use a sanitary pad for vaginal discharge. Summary  Abdominal hysterectomy is a surgery to remove your womb (uterus). The womb is the part of your body that holds a growing baby.  Talk to your doctor about the changes this procedure may cause. These can be physical or emotional.  You will be given pain medicine if you need it.  You will need to stay in the hospital to recover for one to two days. Ask your doctor how long you will need to stay in the hospital after your procedure. This information is not intended to replace advice given to you by your health care provider. Make sure you discuss any questions you have with your health care provider. Document Released: 09/25/2013 Document Revised: 09/08/2016 Document Reviewed: 09/08/2016 Elsevier Interactive Patient Education  2017 [place].  Abdominal Hysterectomy, Care After This sheet gives you information about how to care for yourself after your procedure. Your doctor may also give you more specific instructions. If you have problems or questions, contact your doctor. Follow these instructions at home: Bathing  Do not take baths, swim, or use a hot tub until your doctor says it is okay. Ask your doctor if you can take showers. You may only be  allowed to take sponge baths for  bathing.  Keep the bandage (dressing) dry until your doctor says it can be taken off. Surgical cut ( incision) care  Follow instructions from your doctor about how to take care of your cut from surgery. Make sure you: ? Wash your hands with soap and water before you change your bandage (dressing). If you cannot use soap and water, use hand sanitizer. ? Change your bandage as told by your doctor. ? Leave stitches (sutures), skin glue, or skin tape (adhesive) strips in place. They may need to stay in place for 2 weeks or longer. If tape strips get loose and curl up, you may trim the loose edges. Do not remove tape strips completely unless your doctor says it is okay.  Check your surgical cut area every day for signs of infection. Check for: ? Redness, swelling, or pain. ? Fluid or blood. ? Warmth. ? Pus or a bad smell. Activity  Do gentle, daily exercise as told by your doctor. You may be told to take short walks every day and go farther each time.  Do not lift anything that is heavier than 10 lb (4.5 kg), or the limit that your doctor tells you, until he or she says that it is safe.  Do not drive or use heavy machinery while taking prescription pain medicine.  Do not drive for 24 hours if you were given a medicine to help you relax (sedative).  Follow your doctor's advice about exercise, driving, and general activities. Ask your doctor what activities are safe for you. Lifestyle  Do not douche, use tampons, or have sex for at least 6 weeks or as told by your doctor.  Do not drink alcohol until your doctor says it is okay.  Drink enough fluid to keep your pee (urine) clear or pale yellow.  Try to have someone at home with you for the first 1-2 weeks to help.  Do not use any products that contain nicotine or tobacco, such as cigarettes and e-cigarettes. These can slow down healing. If you need help quitting, ask your doctor. General instructions  Take over-the-counter and  prescription medicines only as told by your doctor.  Do not take aspirin or ibuprofen. These medicines can cause bleeding.  To prevent or treat constipation while you are taking prescription pain medicine, your doctor may suggest that you: ? Drink enough fluid to keep your urine clear or pale yellow. ? Take over-the-counter or prescription medicines. ? Eat foods that are high in fiber, such as:  Fresh fruits and vegetables.  Whole grains.  Beans. ? Limit foods that are high in fat and processed sugars, such as fried and sweet foods.  Keep all follow-up visits as told by your doctor. This is important. Contact a doctor if:  You have chills or fever.  You have redness, swelling, or pain around your cut.  You have fluid or blood coming from your cut.  Your cut feels warm to the touch.  You have pus or a bad smell coming from your cut.  Your cut breaks open.  You feel dizzy or light-headed.  You have pain or bleeding when you pee.  You keep having watery poop (diarrhea).  You keep feeling sick to your stomach (nauseous) or keep throwing up (vomiting).  You have unusual fluid (discharge) coming from your vagina.  You have a rash.  You have a reaction to your medicine.  Your pain medicine does not help. Get help right away  if:  You have a fever and your symptoms get worse all of a sudden.  You have very bad belly (abdominal) pain.  You are short of breath.  You pass out (faint).  You have pain, swelling, or redness of your leg.  You bleed a lot from your vagina and notice clumps of blood (clots). Summary  Do not take baths, swim, or use a hot tub until your doctor says it is okay. Ask your doctor if you can take showers. You may only be allowed to take sponge baths for bathing.  Follow your doctor's advice about exercise, driving, and general activities. Ask your doctor what activities are safe for you.  Do not lift anything that is heavier than 10 lb (4.5  kg), or the limit that your doctor tells you, until he or she says that it is safe.  Try to have someone at home with you for the first 1-2 weeks to help. This information is not intended to replace advice given to you by your health care provider. Make sure you discuss any questions you have with your health care provider. Document Released: 06/29/2008 Document Revised: 09/08/2016 Document Reviewed: 09/08/2016 Elsevier Interactive Patient Education  2017 [place] Anesthesia, Adult General anesthesia is the use of medicines to make a person "go to sleep" (be unconscious) for a medical procedure. General anesthesia is often recommended when a procedure:  Is long.  Requires you to be still or in an unusual position.  Is major and can cause you to lose blood.  Is impossible to do without general anesthesia.  The medicines used for general anesthesia are called general anesthetics. In addition to making you sleep, the medicines:  Prevent pain.  Control your blood pressure.  Relax your muscles.  Tell a health care provider about:  Any allergies you have.  All medicines you are taking, including vitamins, herbs, eye drops, creams, and over-the-counter medicines.  Any problems you or family members have had with anesthetic medicines.  Types of anesthetics you have had in the past.  Any bleeding disorders you have.  Any surgeries you have had.  Any medical conditions you have.  Any history of heart or lung conditions, such as heart failure, sleep apnea, or chronic obstructive pulmonary disease (COPD).  Whether you are pregnant or may be pregnant.  Whether you use tobacco, alcohol, marijuana, or street drugs.  Any history of Armed forces logistics/support/administrative officer.  Any history of depression or anxiety. What are the risks? Generally, this is a safe procedure. However, problems may occur, including:  Allergic reaction to anesthetics.  Lung and heart problems.  Inhaling food or  liquids from your stomach into your lungs (aspiration).  Injury to nerves.  Waking up during your procedure and being unable to move (rare).  Extreme agitation or a state of mental confusion (delirium) when you wake up from the anesthetic.  Air in the bloodstream, which can lead to stroke.  These problems are more likely to develop if you are having a major surgery or if you have an advanced medical condition. You can prevent some of these complications by answering all of your health care provider's questions thoroughly and by following all pre-procedure instructions. General anesthesia can cause side effects, including:  Nausea or vomiting  A sore throat from the breathing tube.  Feeling cold or shivery.  Feeling tired, washed out, or achy.  Sleepiness or drowsiness.  Confusion or agitation.  What happens before the procedure? Staying hydrated Follow instructions from  your health care provider about hydration, which may include:  Up to 2 hours before the procedure - you may continue to drink clear liquids, such as water, clear fruit juice, black coffee, and plain tea.  Eating and drinking restrictions Follow instructions from your health care provider about eating and drinking, which may include:  8 hours before the procedure - stop eating heavy meals or foods such as meat, fried foods, or fatty foods.  6 hours before the procedure - stop eating light meals or foods, such as toast or cereal.  6 hours before the procedure - stop drinking milk or drinks that contain milk.  2 hours before the procedure - stop drinking clear liquids.  Medicines  Ask your health care provider about: ? Changing or stopping your regular medicines. This is especially important if you are taking diabetes medicines or blood thinners. ? Taking medicines such as aspirin and ibuprofen. These medicines can thin your blood. Do not take these medicines before your procedure if your health care  provider instructs you not to. ? Taking new dietary supplements or medicines. Do not take these during the week before your procedure unless your health care provider approves them.  If you are told to take a medicine or to continue taking a medicine on the day of the procedure, take the medicine with sips of water. General instructions   Ask if you will be going home the same day, the following day, or after a longer hospital stay. ? Plan to have someone take you home. ? Plan to have someone stay with you for the first 24 hours after you leave the hospital or clinic.  For 3-6 weeks before the procedure, try not to use any tobacco products, such as cigarettes, chewing tobacco, and e-cigarettes.  You may brush your teeth on the morning of the procedure, but make sure to spit out the toothpaste. What happens during the procedure?  You will be given anesthetics through a mask and through an IV tube in one of your veins.  You may receive medicine to help you relax (sedative).  As soon as you are asleep, a breathing tube may be used to help you breathe.  An anesthesia specialist will stay with you throughout the procedure. He or she will help keep you comfortable and safe by continuing to give you medicines and adjusting the amount of medicine that you get. He or she will also watch your blood pressure, pulse, and oxygen levels to make sure that the anesthetics do not cause any problems.  If a breathing tube was used to help you breathe, it will be removed before you wake up. The procedure may vary among health care providers and hospitals. What happens after the procedure?  You will wake up, often slowly, after the procedure is complete, usually in a recovery area.  Your blood pressure, heart rate, breathing rate, and blood oxygen level will be monitored until the medicines you were given have worn off.  You may be given medicine to help you calm down if you feel anxious or  agitated.  If you will be going home the same day, your health care provider may check to make sure you can stand, drink, and urinate.  Your health care providers will treat your pain and side effects before you go home.  Do not drive for 24 hours if you received a sedative.  You may: ? Feel nauseous and vomit. ? Have a sore throat. ? Have mental slowness. ?  Feel cold or shivery. ? Feel sleepy. ? Feel tired. ? Feel sore or achy, even in parts of your body where you did not have surgery. This information is not intended to replace advice given to you by your health care provider. Make sure you discuss any questions you have with your health care provider. Document Released: 12/28/2007 Document Revised: 03/02/2016 Document Reviewed: 09/04/2015 Elsevier Interactive Patient Education  2018 [place] Anesthesia, Adult, Care After These instructions provide you with information about caring for yourself after your procedure. Your health care provider may also give you more specific instructions. Your treatment has been planned according to current medical practices, but problems sometimes occur. Call your health care provider if you have any problems or questions after your procedure. What can I expect after the procedure? After the procedure, it is common to have:  Vomiting.  A sore throat.  Mental slowness.  It is common to feel:  Nauseous.  Cold or shivery.  Sleepy.  Tired.  Sore or achy, even in parts of your body where you did not have surgery.  Follow these instructions at home: For at least 24 hours after the procedure:  Do not: ? Participate in activities where you could fall or become injured. ? Drive. ? Use heavy machinery. ? Drink alcohol. ? Take sleeping pills or medicines that cause drowsiness. ? Make important decisions or sign legal documents. ? Take care of children on your own.  Rest. Eating and drinking  If you vomit, drink water,  juice, or soup when you can drink without vomiting.  Drink enough fluid to keep your urine clear or pale yellow.  Make sure you have little or no nausea before eating solid foods.  Follow the diet recommended by your health care provider. General instructions  Have a responsible adult stay with you until you are awake and alert.  Return to your normal activities as told by your health care provider. Ask your health care provider what activities are safe for you.  Take over-the-counter and prescription medicines only as told by your health care provider.  If you smoke, do not smoke without supervision.  Keep all follow-up visits as told by your health care provider. This is important. Contact a health care provider if:  You continue to have nausea or vomiting at home, and medicines are not helpful.  You cannot drink fluids or start eating again.  You cannot urinate after 8-12 hours.  You develop a skin rash.  You have fever.  You have increasing redness at the site of your procedure. Get help right away if:  You have difficulty breathing.  You have chest pain.  You have unexpected bleeding.  You feel that you are having a life-threatening or urgent problem. This information is not intended to replace advice given to you by your health care provider. Make sure you discuss any questions you have with your health care provider. Document Released: 12/27/2000 Document Revised: 02/23/2016 Document Reviewed: 09/04/2015 Elsevier Interactive Patient Education  [employer].

## 2018-06-28 ENCOUNTER — Other Ambulatory Visit: Payer: Self-pay | Admitting: Physician Assistant

## 2018-06-28 ENCOUNTER — Encounter: Payer: Self-pay | Admitting: Physician Assistant

## 2018-06-28 MED ORDER — CLOPIDOGREL BISULFATE 75 MG PO TABS: 75.0000 mg | ORAL_TABLET | Freq: Every day | ORAL | 0 refills | Status: DC

## 2018-06-28 MED ORDER — DULOXETINE HCL 20 MG PO CPEP: 20.0000 mg | ORAL_CAPSULE | Freq: Every day | ORAL | 0 refills | Status: DC

## 2018-06-29 ENCOUNTER — Other Ambulatory Visit: Payer: Self-pay | Admitting: Obstetrics & Gynecology

## 2018-06-30 ENCOUNTER — Other Ambulatory Visit: Payer: Self-pay

## 2018-06-30 ENCOUNTER — Encounter (HOSPITAL_COMMUNITY)
Admission: RE | Admit: 2018-06-30 | Discharge: 2018-06-30 | Disposition: A | Discharge status: Home / Self Care | Payer: 59 | Source: Ambulatory Visit | Attending: Obstetrics & Gynecology | Admitting: Obstetrics & Gynecology

## 2018-06-30 ENCOUNTER — Encounter (HOSPITAL_COMMUNITY): Payer: Self-pay

## 2018-06-30 DIAGNOSIS — D649 Anemia, unspecified: Secondary | ICD-10-CM | POA: Diagnosis not present

## 2018-06-30 DIAGNOSIS — K219 Gastro-esophageal reflux disease without esophagitis: Secondary | ICD-10-CM | POA: Insufficient documentation

## 2018-06-30 DIAGNOSIS — F329 Major depressive disorder, single episode, unspecified: Secondary | ICD-10-CM | POA: Diagnosis not present

## 2018-06-30 DIAGNOSIS — I10 Essential (primary) hypertension: Secondary | ICD-10-CM | POA: Insufficient documentation

## 2018-06-30 DIAGNOSIS — E119 Type 2 diabetes mellitus without complications: Secondary | ICD-10-CM | POA: Insufficient documentation

## 2018-06-30 DIAGNOSIS — J45909 Unspecified asthma, uncomplicated: Secondary | ICD-10-CM | POA: Diagnosis not present

## 2018-06-30 DIAGNOSIS — F17210 Nicotine dependence, cigarettes, uncomplicated: Secondary | ICD-10-CM | POA: Insufficient documentation

## 2018-06-30 DIAGNOSIS — Z79899 Other long term (current) drug therapy: Secondary | ICD-10-CM | POA: Diagnosis not present

## 2018-06-30 DIAGNOSIS — Z01812 Encounter for preprocedural laboratory examination: Secondary | ICD-10-CM | POA: Insufficient documentation

## 2018-06-30 DIAGNOSIS — R569 Unspecified convulsions: Secondary | ICD-10-CM | POA: Insufficient documentation

## 2018-06-30 DIAGNOSIS — Z8673 Personal history of transient ischemic attack (TIA), and cerebral infarction without residual deficits: Secondary | ICD-10-CM | POA: Insufficient documentation

## 2018-06-30 HISTORY — DX: Personal history of urinary calculi: Z87.442

## 2018-06-30 HISTORY — DX: Unspecified convulsions: R56.9

## 2018-06-30 HISTORY — DX: Anemia, unspecified: D64.9

## 2018-06-30 LAB — CBC
HCT: 43.1 % (ref 36.0–46.0)
HEMOGLOBIN: 14.1 g/dL (ref 12.0–15.0)
MCH: 30.4 pg (ref 26.0–34.0)
MCHC: 32.7 g/dL (ref 30.0–36.0)
MCV: 92.9 fL (ref 78.0–100.0)
PLATELETS: 252 10*3/uL (ref 150–400)
RBC: 4.64 MIL/uL (ref 3.87–5.11)
RDW: 13.8 % (ref 11.5–15.5)
WBC: 8.9 10*3/uL (ref 4.0–10.5)

## 2018-06-30 LAB — URINALYSIS, ROUTINE W REFLEX MICROSCOPIC
BACTERIA UA: NONE SEEN
Bilirubin Urine: NEGATIVE
GLUCOSE, UA: NEGATIVE mg/dL
Ketones, ur: NEGATIVE mg/dL
NITRITE: NEGATIVE
PROTEIN: 30 mg/dL — AB
RBC / HPF: >50 RBC/hpf — ABNORMAL HIGH (ref 0–5)
Specific Gravity, Urine: 1.012 (ref 1.005–1.030)
pH: 8 (ref 5.0–8.0)

## 2018-06-30 LAB — COMPREHENSIVE METABOLIC PANEL
ALBUMIN: 3.5 g/dL (ref 3.5–5.0)
ALK PHOS: 60 U/L (ref 38–126)
ALT: 11 U/L (ref 0–44)
AST: 11 U/L — AB (ref 15–41)
Anion gap: 7 (ref 5–15)
BUN: 11 mg/dL (ref 6–20)
CALCIUM: 8.8 mg/dL — AB (ref 8.9–10.3)
CHLORIDE: 109 mmol/L (ref 98–111)
CO2: 23 mmol/L (ref 22–32)
CREATININE: 0.53 mg/dL (ref 0.44–1.00)
GFR calc Af Amer: >60 mL/min (ref >60)
GFR calc non Af Amer: >60 mL/min (ref >60)
GLUCOSE: 161 mg/dL — AB (ref 70–99)
Potassium: 3.8 mmol/L (ref 3.5–5.1)
SODIUM: 139 mmol/L (ref 135–145)
Total Bilirubin: 0.5 mg/dL (ref 0.3–1.2)
Total Protein: 7.2 g/dL (ref 6.5–8.1)

## 2018-06-30 LAB — HCG, QUANTITATIVE, PREGNANCY: hCG, Beta Chain, Quant, S: <1 m[IU]/mL (ref <5)

## 2018-06-30 LAB — RAPID HIV SCREEN (HIV 1/2 AB+AG)
HIV 1/2 Antibodies: NONREACTIVE
HIV-1 P24 ANTIGEN - HIV24: NONREACTIVE

## 2018-06-30 LAB — GLUCOSE, CAPILLARY: GLUCOSE-CAPILLARY: 162 mg/dL — AB (ref 70–99)

## 2018-06-30 LAB — TYPE AND SCREEN
ABO/RH(D): O POS
ANTIBODY SCREEN: NEGATIVE

## 2018-07-05 ENCOUNTER — Inpatient Hospital Stay (HOSPITAL_COMMUNITY): Payer: 59 | Admitting: Anesthesiology

## 2018-07-05 ENCOUNTER — Other Ambulatory Visit: Payer: Self-pay

## 2018-07-05 ENCOUNTER — Encounter (HOSPITAL_COMMUNITY): Payer: Self-pay | Admitting: *Deleted

## 2018-07-05 ENCOUNTER — Inpatient Hospital Stay (HOSPITAL_COMMUNITY)
Admission: RE | Admit: 2018-07-05 | Discharge: 2018-07-06 | DRG: 743 | Disposition: A | Discharge status: Home / Self Care | Payer: 59 | Source: Ambulatory Visit | Attending: Obstetrics & Gynecology | Admitting: Obstetrics & Gynecology

## 2018-07-05 ENCOUNTER — Encounter (HOSPITAL_COMMUNITY)
Admission: RE | Disposition: A | Discharge status: Home / Self Care | Payer: Self-pay | Source: Ambulatory Visit | Attending: Obstetrics & Gynecology

## 2018-07-05 DIAGNOSIS — K219 Gastro-esophageal reflux disease without esophagitis: Secondary | ICD-10-CM | POA: Diagnosis present

## 2018-07-05 DIAGNOSIS — N921 Excessive and frequent menstruation with irregular cycle: Secondary | ICD-10-CM | POA: Diagnosis present

## 2018-07-05 DIAGNOSIS — Z8349 Family history of other endocrine, nutritional and metabolic diseases: Secondary | ICD-10-CM | POA: Diagnosis not present

## 2018-07-05 DIAGNOSIS — Z833 Family history of diabetes mellitus: Secondary | ICD-10-CM

## 2018-07-05 DIAGNOSIS — N946 Dysmenorrhea, unspecified: Secondary | ICD-10-CM | POA: Diagnosis present

## 2018-07-05 DIAGNOSIS — Z8249 Family history of ischemic heart disease and other diseases of the circulatory system: Secondary | ICD-10-CM | POA: Diagnosis not present

## 2018-07-05 DIAGNOSIS — J449 Chronic obstructive pulmonary disease, unspecified: Secondary | ICD-10-CM | POA: Diagnosis present

## 2018-07-05 DIAGNOSIS — I152 Hypertension secondary to endocrine disorders: Secondary | ICD-10-CM | POA: Diagnosis present

## 2018-07-05 DIAGNOSIS — L732 Hidradenitis suppurativa: Secondary | ICD-10-CM | POA: Diagnosis present

## 2018-07-05 DIAGNOSIS — E1140 Type 2 diabetes mellitus with diabetic neuropathy, unspecified: Secondary | ICD-10-CM | POA: Diagnosis present

## 2018-07-05 DIAGNOSIS — Z888 Allergy status to other drugs, medicaments and biological substances status: Secondary | ICD-10-CM | POA: Diagnosis not present

## 2018-07-05 DIAGNOSIS — E785 Hyperlipidemia, unspecified: Secondary | ICD-10-CM | POA: Diagnosis present

## 2018-07-05 DIAGNOSIS — D252 Subserosal leiomyoma of uterus: Secondary | ICD-10-CM | POA: Diagnosis not present

## 2018-07-05 DIAGNOSIS — Z8673 Personal history of transient ischemic attack (TIA), and cerebral infarction without residual deficits: Secondary | ICD-10-CM | POA: Diagnosis not present

## 2018-07-05 DIAGNOSIS — Z87891 Personal history of nicotine dependence: Secondary | ICD-10-CM | POA: Diagnosis not present

## 2018-07-05 DIAGNOSIS — D259 Leiomyoma of uterus, unspecified: Principal | ICD-10-CM | POA: Diagnosis present

## 2018-07-05 DIAGNOSIS — Z91048 Other nonmedicinal substance allergy status: Secondary | ICD-10-CM | POA: Diagnosis not present

## 2018-07-05 DIAGNOSIS — Z90710 Acquired absence of both cervix and uterus: Secondary | ICD-10-CM | POA: Diagnosis present

## 2018-07-05 DIAGNOSIS — E1169 Type 2 diabetes mellitus with other specified complication: Secondary | ICD-10-CM | POA: Diagnosis present

## 2018-07-05 HISTORY — PX: ABDOMINAL HYSTERECTOMY: SHX81

## 2018-07-05 HISTORY — PX: SALPINGOOPHORECTOMY: SHX82

## 2018-07-05 LAB — GLUCOSE, CAPILLARY
GLUCOSE-CAPILLARY: 148 mg/dL — AB (ref 70–99)
GLUCOSE-CAPILLARY: 294 mg/dL — AB (ref 70–99)
Glucose-Capillary: 168 mg/dL — ABNORMAL HIGH (ref 70–99)

## 2018-07-05 SURGERY — HYSTERECTOMY, ABDOMINAL
Anesthesia: General

## 2018-07-05 MED ORDER — ZOLPIDEM TARTRATE 5 MG PO TABS: 5.0000 mg | ORAL_TABLET | Freq: Every evening | ORAL | Status: DC | PRN

## 2018-07-05 MED ORDER — ONDANSETRON HCL 4 MG/2ML IJ SOLN
INTRAMUSCULAR | Status: AC
  Filled 2018-07-05: qty 2

## 2018-07-05 MED ORDER — MEPERIDINE HCL 50 MG/ML IJ SOLN
6.2500 mg | INTRAMUSCULAR | Status: DC | PRN
  Administered 2018-07-05: 12.5 mg via INTRAVENOUS
  Filled 2018-07-05: qty 1

## 2018-07-05 MED ORDER — SENNOSIDES-DOCUSATE SODIUM 8.6-50 MG PO TABS: 1.0000 | ORAL_TABLET | Freq: Every evening | ORAL | Status: DC | PRN

## 2018-07-05 MED ORDER — AMLODIPINE BESYLATE 5 MG PO TABS
10.0000 mg | ORAL_TABLET | Freq: Every day | ORAL | Status: DC
  Administered 2018-07-06: 10 mg via ORAL
  Filled 2018-07-05: qty 2

## 2018-07-05 MED ORDER — ZOLPIDEM TARTRATE 5 MG PO TABS: 10.0000 mg | ORAL_TABLET | Freq: Every evening | ORAL | Status: DC | PRN

## 2018-07-05 MED ORDER — LACTATED RINGERS IV SOLN: INTRAVENOUS | Status: DC

## 2018-07-05 MED ORDER — PROPOFOL 10 MG/ML IV BOLUS
INTRAVENOUS | Status: AC
  Filled 2018-07-05: qty 20

## 2018-07-05 MED ORDER — METFORMIN HCL 500 MG PO TABS
1000.0000 mg | ORAL_TABLET | Freq: Two times a day (BID) | ORAL | Status: DC
  Administered 2018-07-05 – 2018-07-06 (×2): 1000 mg via ORAL
  Filled 2018-07-05 (×2): qty 2

## 2018-07-05 MED ORDER — ENOXAPARIN SODIUM 40 MG/0.4ML ~~LOC~~ SOLN
40.0000 mg | SUBCUTANEOUS | Status: DC
  Administered 2018-07-06: 40 mg via SUBCUTANEOUS
  Filled 2018-07-05: qty 0.4

## 2018-07-05 MED ORDER — ONDANSETRON HCL 4 MG PO TABS: 8.0000 mg | ORAL_TABLET | Freq: Four times a day (QID) | ORAL | Status: DC | PRN

## 2018-07-05 MED ORDER — SODIUM CHLORIDE 0.9% FLUSH
INTRAVENOUS | Status: AC
  Filled 2018-07-05: qty 20

## 2018-07-05 MED ORDER — ONDANSETRON HCL 4 MG/2ML IJ SOLN
INTRAMUSCULAR | Status: DC | PRN
  Administered 2018-07-05: 4 mg via INTRAVENOUS

## 2018-07-05 MED ORDER — FENTANYL CITRATE (PF) 250 MCG/5ML IJ SOLN
INTRAMUSCULAR | Status: AC
  Filled 2018-07-05: qty 5

## 2018-07-05 MED ORDER — PROMETHAZINE HCL 25 MG/ML IJ SOLN
6.2500 mg | INTRAMUSCULAR | Status: DC | PRN
  Administered 2018-07-05: 12.5 mg via INTRAVENOUS
  Filled 2018-07-05: qty 1

## 2018-07-05 MED ORDER — HYDROMORPHONE HCL 1 MG/ML IJ SOLN
0.2500 mg | INTRAMUSCULAR | Status: DC | PRN
  Administered 2018-07-05 (×2): 0.5 mg via INTRAVENOUS
  Filled 2018-07-05 (×2): qty 0.5

## 2018-07-05 MED ORDER — CEFAZOLIN SODIUM-DEXTROSE 2-4 GM/100ML-% IV SOLN
2.0000 g | INTRAVENOUS | Status: AC
  Administered 2018-07-05: 2 g via INTRAVENOUS

## 2018-07-05 MED ORDER — MIDAZOLAM HCL 5 MG/5ML IJ SOLN
INTRAMUSCULAR | Status: DC | PRN
  Administered 2018-07-05: 2 mg via INTRAVENOUS

## 2018-07-05 MED ORDER — HYDROCODONE-ACETAMINOPHEN 7.5-325 MG PO TABS: 1.0000 | ORAL_TABLET | Freq: Once | ORAL | Status: DC | PRN

## 2018-07-05 MED ORDER — BUPIVACAINE LIPOSOME 1.3 % IJ SUSP
20.0000 mL | Freq: Once | INTRAMUSCULAR | Status: DC
  Filled 2018-07-05: qty 20

## 2018-07-05 MED ORDER — LABETALOL HCL 5 MG/ML IV SOLN
INTRAVENOUS | Status: DC | PRN
  Administered 2018-07-05: 5 mg via INTRAVENOUS

## 2018-07-05 MED ORDER — LACTATED RINGERS IV SOLN
INTRAVENOUS | Status: DC
  Administered 2018-07-05 (×2): via INTRAVENOUS

## 2018-07-05 MED ORDER — SODIUM CHLORIDE 0.9 % IV SOLN
8.0000 mg | Freq: Four times a day (QID) | INTRAVENOUS | Status: DC | PRN
  Filled 2018-07-05: qty 4

## 2018-07-05 MED ORDER — ROCURONIUM BROMIDE 50 MG/5ML IV SOLN
INTRAVENOUS | Status: AC
  Filled 2018-07-05: qty 1

## 2018-07-05 MED ORDER — MIDAZOLAM HCL 2 MG/2ML IJ SOLN
INTRAMUSCULAR | Status: AC
  Filled 2018-07-05: qty 2

## 2018-07-05 MED ORDER — ROCURONIUM BROMIDE 10 MG/ML (PF) SYRINGE
PREFILLED_SYRINGE | INTRAVENOUS | Status: DC | PRN
  Administered 2018-07-05: 10 mg via INTRAVENOUS
  Administered 2018-07-05: 50 mg via INTRAVENOUS

## 2018-07-05 MED ORDER — PROMETHAZINE HCL 25 MG/ML IJ SOLN
25.0000 mg | Freq: Four times a day (QID) | INTRAMUSCULAR | Status: DC | PRN
  Filled 2018-07-05: qty 1

## 2018-07-05 MED ORDER — CEFAZOLIN SODIUM-DEXTROSE 2-4 GM/100ML-% IV SOLN
INTRAVENOUS | Status: AC
  Filled 2018-07-05: qty 100

## 2018-07-05 MED ORDER — FENTANYL CITRATE (PF) 250 MCG/5ML IJ SOLN
INTRAMUSCULAR | Status: DC | PRN
  Administered 2018-07-05: 150 ug via INTRAVENOUS
  Administered 2018-07-05: 100 ug via INTRAVENOUS
  Administered 2018-07-05 (×2): 50 ug via INTRAVENOUS
  Administered 2018-07-05: 100 ug via INTRAVENOUS
  Administered 2018-07-05: 50 ug via INTRAVENOUS

## 2018-07-05 MED ORDER — DEXAMETHASONE SODIUM PHOSPHATE 4 MG/ML IJ SOLN
INTRAMUSCULAR | Status: AC
  Filled 2018-07-05: qty 1

## 2018-07-05 MED ORDER — KETOROLAC TROMETHAMINE 30 MG/ML IJ SOLN
30.0000 mg | Freq: Four times a day (QID) | INTRAMUSCULAR | Status: AC
  Administered 2018-07-05 – 2018-07-06 (×3): 30 mg via INTRAVENOUS
  Filled 2018-07-05 (×3): qty 1

## 2018-07-05 MED ORDER — SODIUM CHLORIDE 0.9 % IR SOLN
Status: DC | PRN
  Administered 2018-07-05: 1000 mL

## 2018-07-05 MED ORDER — LIDOCAINE HCL (PF) 1 % IJ SOLN
INTRAMUSCULAR | Status: AC
  Filled 2018-07-05: qty 5

## 2018-07-05 MED ORDER — ATORVASTATIN CALCIUM 40 MG PO TABS
40.0000 mg | ORAL_TABLET | Freq: Every day | ORAL | Status: DC
  Administered 2018-07-05: 40 mg via ORAL
  Filled 2018-07-05: qty 1

## 2018-07-05 MED ORDER — INSULIN GLARGINE 100 UNIT/ML ~~LOC~~ SOLN
15.0000 [IU] | Freq: Every day | SUBCUTANEOUS | Status: DC
  Administered 2018-07-05: 15 [IU] via SUBCUTANEOUS
  Filled 2018-07-05 (×2): qty 0.15

## 2018-07-05 MED ORDER — LABETALOL HCL 5 MG/ML IV SOLN
INTRAVENOUS | Status: AC
  Filled 2018-07-05: qty 4

## 2018-07-05 MED ORDER — PROPOFOL 10 MG/ML IV BOLUS
INTRAVENOUS | Status: DC | PRN
  Administered 2018-07-05: 200 mg via INTRAVENOUS

## 2018-07-05 MED ORDER — KCL IN DEXTROSE-NACL 20-5-0.45 MEQ/L-%-% IV SOLN
INTRAVENOUS | Status: DC
  Administered 2018-07-05 – 2018-07-06 (×3): via INTRAVENOUS

## 2018-07-05 MED ORDER — METOPROLOL TARTRATE 50 MG PO TABS
100.0000 mg | ORAL_TABLET | Freq: Two times a day (BID) | ORAL | Status: DC
  Administered 2018-07-05 – 2018-07-06 (×2): 100 mg via ORAL
  Filled 2018-07-05 (×2): qty 2

## 2018-07-05 MED ORDER — HYDROMORPHONE HCL 1 MG/ML IJ SOLN
0.5000 mg | INTRAMUSCULAR | Status: DC | PRN
  Administered 2018-07-05 (×2): 1 mg via INTRAVENOUS
  Administered 2018-07-05: 1.5 mg via INTRAVENOUS
  Administered 2018-07-06: 0.5 mg via INTRAVENOUS
  Filled 2018-07-05: qty 1
  Filled 2018-07-05: qty 1.5
  Filled 2018-07-05: qty 1
  Filled 2018-07-05: qty 0.5

## 2018-07-05 MED ORDER — DIPHENHYDRAMINE HCL 50 MG/ML IJ SOLN: 25.0000 mg | Freq: Four times a day (QID) | INTRAMUSCULAR | Status: DC | PRN

## 2018-07-05 MED ORDER — SUGAMMADEX SODIUM 200 MG/2ML IV SOLN
INTRAVENOUS | Status: DC | PRN
  Administered 2018-07-05: 200 mg via INTRAVENOUS

## 2018-07-05 MED ORDER — OXYCODONE-ACETAMINOPHEN 5-325 MG PO TABS
1.0000 | ORAL_TABLET | ORAL | Status: DC | PRN
  Administered 2018-07-05 – 2018-07-06 (×3): 1 via ORAL
  Filled 2018-07-05 (×3): qty 1

## 2018-07-05 MED ORDER — BUPIVACAINE LIPOSOME 1.3 % IJ SUSP
INTRAMUSCULAR | Status: DC | PRN
  Administered 2018-07-05: 20 mL

## 2018-07-05 MED ORDER — ALUM & MAG HYDROXIDE-SIMETH 200-200-20 MG/5ML PO SUSP: 30.0000 mL | ORAL | Status: DC | PRN

## 2018-07-05 MED ORDER — KETOROLAC TROMETHAMINE 30 MG/ML IJ SOLN
30.0000 mg | Freq: Once | INTRAMUSCULAR | Status: AC
  Administered 2018-07-05: 30 mg via INTRAVENOUS
  Filled 2018-07-05: qty 1

## 2018-07-05 MED ORDER — LOSARTAN POTASSIUM 50 MG PO TABS
100.0000 mg | ORAL_TABLET | Freq: Every day | ORAL | Status: DC
  Administered 2018-07-06: 100 mg via ORAL
  Filled 2018-07-05: qty 2

## 2018-07-05 MED ORDER — SUGAMMADEX SODIUM 200 MG/2ML IV SOLN
INTRAVENOUS | Status: AC
  Filled 2018-07-05: qty 2

## 2018-07-05 MED ORDER — BUPIVACAINE LIPOSOME 1.3 % IJ SUSP
INTRAMUSCULAR | Status: AC
  Filled 2018-07-05: qty 20

## 2018-07-05 MED ORDER — BISACODYL 10 MG RE SUPP: 10.0000 mg | Freq: Every day | RECTAL | Status: DC | PRN

## 2018-07-05 MED ORDER — DULOXETINE HCL 20 MG PO CPEP
20.0000 mg | ORAL_CAPSULE | Freq: Every day | ORAL | Status: DC
  Administered 2018-07-06: 20 mg via ORAL
  Filled 2018-07-05: qty 1

## 2018-07-05 MED ORDER — DOCUSATE SODIUM 100 MG PO CAPS
100.0000 mg | ORAL_CAPSULE | Freq: Two times a day (BID) | ORAL | Status: DC
  Administered 2018-07-05 – 2018-07-06 (×2): 100 mg via ORAL
  Filled 2018-07-05 (×2): qty 1

## 2018-07-05 MED ORDER — HEMOSTATIC AGENTS (NO CHARGE) OPTIME
TOPICAL | Status: DC | PRN
  Administered 2018-07-05: 1 via TOPICAL

## 2018-07-05 SURGICAL SUPPLY — 46 items
ADH SKN CLS APL DERMABOND .7 (GAUZE/BANDAGES/DRESSINGS) ×2
APPLIER CLIP 13 LRG OPEN (CLIP)
APR CLP LRG 13 20 CLIP (CLIP)
CLIP APPLIE 13 LRG OPEN (CLIP) IMPLANT
CLOTH BEACON ORANGE TIMEOUT ST (SAFETY) ×4 IMPLANT
COVER LIGHT HANDLE STERIS (MISCELLANEOUS) ×8 IMPLANT
DERMABOND ADVANCED (GAUZE/BANDAGES/DRESSINGS) ×2
DERMABOND ADVANCED .7 DNX12 (GAUZE/BANDAGES/DRESSINGS) ×2 IMPLANT
DRAPE WARM FLUID 44X44 (DRAPE) ×4 IMPLANT
DRSG OPSITE POSTOP 4X10 (GAUZE/BANDAGES/DRESSINGS) ×2 IMPLANT
DRSG TELFA 3X8 NADH (GAUZE/BANDAGES/DRESSINGS) ×4 IMPLANT
ELECT REM PT RETURN 9FT ADLT (ELECTROSURGICAL) ×4
ELECTRODE REM PT RTRN 9FT ADLT (ELECTROSURGICAL) ×2 IMPLANT
GAUZE SPONGE 4X4 16PLY XRAY LF (GAUZE/BANDAGES/DRESSINGS) ×4 IMPLANT
GLOVE BIO SURGEON STRL SZ7 (GLOVE) ×2 IMPLANT
GLOVE BIOGEL PI IND STRL 7.0 (GLOVE) ×4 IMPLANT
GLOVE BIOGEL PI IND STRL 8 (GLOVE) ×2 IMPLANT
GLOVE BIOGEL PI INDICATOR 7.0 (GLOVE) ×8
GLOVE BIOGEL PI INDICATOR 8 (GLOVE) ×2
GLOVE ECLIPSE 6.5 STRL STRAW (GLOVE) ×2 IMPLANT
GLOVE ECLIPSE 8.0 STRL XLNG CF (GLOVE) ×4 IMPLANT
GOWN STRL REUS W/TWL LRG LVL3 (GOWN DISPOSABLE) ×8 IMPLANT
GOWN STRL REUS W/TWL XL LVL3 (GOWN DISPOSABLE) ×4 IMPLANT
HEMOSTAT ARISTA ABSORB 3G PWDR (MISCELLANEOUS) ×2 IMPLANT
INST SET MAJOR GENERAL (KITS) ×4 IMPLANT
KIT TURNOVER KIT A (KITS) ×4 IMPLANT
MANIFOLD NEPTUNE II (INSTRUMENTS) ×4 IMPLANT
NDL HYPO 21X1.5 SAFETY (NEEDLE) ×2 IMPLANT
NEEDLE HYPO 21X1.5 SAFETY (NEEDLE) ×4 IMPLANT
NS IRRIG 1000ML POUR BTL (IV SOLUTION) ×8 IMPLANT
PACK ABDOMINAL MAJOR (CUSTOM PROCEDURE TRAY) ×4 IMPLANT
PAD ARMBOARD 7.5X6 YLW CONV (MISCELLANEOUS) ×4 IMPLANT
PAD DRESSING TELFA 3X8 NADH (GAUZE/BANDAGES/DRESSINGS) ×2 IMPLANT
SET BASIN LINEN APH (SET/KITS/TRAYS/PACK) ×4 IMPLANT
SUT CHROMIC 0 CT 1 (SUTURE) ×4 IMPLANT
SUT MON AB 3-0 SH 27 (SUTURE) ×8 IMPLANT
SUT PLAIN 2 0 XLH (SUTURE) ×8 IMPLANT
SUT VIC AB 0 CT1 27 (SUTURE) ×12
SUT VIC AB 0 CT1 27XBRD ANTBC (SUTURE) ×2 IMPLANT
SUT VIC AB 0 CT1 27XCR 8 STRN (SUTURE) ×4 IMPLANT
SUT VIC AB 0 CTX 36 (SUTURE) ×4
SUT VIC AB 0 CTX36XBRD ANTBCTR (SUTURE) ×2 IMPLANT
SUT VICRYL 3 0 (SUTURE) ×4 IMPLANT
SYR 20CC LL (SYRINGE) ×4 IMPLANT
TOWEL SURG RFD BLUE STRL DISP (DISPOSABLE) ×4 IMPLANT
TRAY FOLEY MTR SLVR 16FR STAT (SET/KITS/TRAYS/PACK) ×4 IMPLANT

## 2018-07-05 NOTE — Transfer of Care (Signed)
Immediate Anesthesia Transfer of Care Note  Patient: Caitlin Fritz  Procedure(s) Performed: HYSTERECTOMY ABDOMINAL (N/A ) SALPINGO OOPHORECTOMY (Bilateral )  Patient Location: PACU  Anesthesia Type:General  Level of Consciousness: awake, alert  and oriented  Airway & Oxygen Therapy: Patient Spontanous Breathing and Patient connected to nasal cannula oxygen  Post-op Assessment: Report given to RN and Post -op Vital signs reviewed and stable  Post vital signs: Reviewed and stable  Last Vitals:  Vitals Value Taken Time  BP 151/78 07/05/2018  9:30 AM  Temp    Pulse 65 07/05/2018  9:30 AM  Resp 13 07/05/2018  9:30 AM  SpO2 86 % 07/05/2018  9:30 AM  Vitals shown include unvalidated device data.  Last Pain:  Vitals:   07/05/18 0643  TempSrc: Oral  PainSc:          Complications: No apparent anesthesia complications

## 2018-07-05 NOTE — H&P (Signed)
Preoperative History and Physical  Caitlin Fritz is a 47 y.o. G1P0100 with No LMP recorded. admitted for a abdominal supracervical hysterectomy with removal of both tubes and ovaries due to 20 week size fibroids with heavy painful periods.  Have anaged on megestrol up until now Pt understands the risks of peri operative risks of clots   PMH:        Past Medical History:  Diagnosis Date  . Asthma   . Bronchitis   . COPD (chronic obstructive pulmonary disease) ([place])   . Depression   . Diabetes mellitus without complication [facility])    diagnosed age 31  . GERD (gastroesophageal reflux disease)   . Hydradenitis   . Hypertension   . Seizures ([place])    once after taking clonidine  . Stroke [facility])    mini stroke June 2015, regular stroke Jan. 29, 2019    PSH:          Past Surgical History:  Procedure Laterality Date  . INCISE AND DRAIN ABCESS     right inner thigh  . INCISION AND DRAINAGE  2013   bilateral, hydradenitis done at [place]:      OB History    Gravida  1   Para  1   Term  0   Preterm  1   AB  0   Living        SAB  0   TAB  0   Ectopic  0   Multiple      Live Births  1           SH:   Social History        Tobacco Use  . Smoking status: Former Smoker    Packs/day: 0.50    Years: 20.00    Pack years: 10.00    Types: Cigarettes    Last attempt to quit: 11/01/2017    Years since quitting: 0.6  . Smokeless tobacco: Never Used  Substance Use Topics  . Alcohol use: No    Comment: occ  . Drug use: No    FH:         Family History  Problem Relation Age of Onset  . Hyperlipidemia Mother   . Hypertension Mother   . Thyroid disease Mother   . Diabetes Father   . Heart disease Father   . Hypertension Father   . Cancer Maternal Grandmother        cervical cancer  . Diabetes Maternal Grandfather   . Diabetes Paternal Grandmother   . Pneumonia Sister    . Preterm labor Son      Allergies:       Allergies  Allergen Reactions  . Clonidine Derivatives Other (See Comments)    Seizures   . Clonidine Derivatives Other (See Comments)    seizures  . Prednisone Other (See Comments)    Eye drops only-temporary blindness Chemical burn in eyes   . Adhesive [Tape] Rash  . Eye Drops [Tetrahydrozoline] Rash    Medications:       Current Outpatient Medications:  .  amLODipine (NORVASC) 10 MG tablet, Take 1 tablet (10 mg total) by mouth daily., Disp: 90 tablet, Rfl: 3 .  aspirin 325 MG tablet, Take 325 mg by mouth daily., Disp: , Rfl:  .  atorvastatin (LIPITOR) 40 MG tablet, Take 1 tablet (40 mg total) by mouth daily., Disp: 90 tablet, Rfl: 1 .  clopidogrel (PLAVIX) 75 MG tablet, Take 1 tablet (75  mg total) by mouth daily., Disp: 30 tablet, Rfl: 0 .  diclofenac sodium (VOLTAREN) 1 % GEL, Apply 2 g topically 4 (four) times daily., Disp: 3 Tube, Rfl: 1 .  DULoxetine (CYMBALTA) 20 MG capsule, Take 1 capsule (20 mg total) by mouth daily., Disp: 30 capsule, Rfl: 0 .  ferrous sulfate 325 (65 FE) MG tablet, Take 1 tablet (325 mg total) by mouth daily with breakfast., Disp: 90 tablet, Rfl: 3 .  Insulin Glargine (LANTUS SOLOSTAR) 100 UNIT/ML Solostar Pen, Inject 15 Units into the skin at bedtime., Disp: 5 pen, Rfl: PRN .  losartan (COZAAR) 100 MG tablet, Take 1 tablet (100 mg total) by mouth daily., Disp: 90 tablet, Rfl: 1 .  megestrol (MEGACE) 40 MG tablet, 2 tablets daily, Disp: 60 tablet, Rfl: 3 .  metFORMIN (GLUCOPHAGE) 1000 MG tablet, Take 1 tablet (1,000 mg total) by mouth 2 (two) times daily with a meal., Disp: 180 tablet, Rfl: 1 .  metoprolol tartrate (LOPRESSOR) 100 MG tablet, Take 1 tablet (100 mg total) by mouth 2 (two) times daily., Disp: 60 tablet, Rfl: 3 .  enoxaparin (LOVENOX) 40 MG/0.4ML injection, Inject 0.4 mLs (40 mg total) into the skin daily. Begin 06/23/2018, Disp: 30 Syringe, Rfl: 0 .  megestrol (MEGACE) 40 MG tablet, 3  tablets a day for 5 days, 2 tablets a day for 5 days then 1 tablet daily (Patient taking differently: Take 40 mg by mouth daily. 3 tablets a day for 5 days, 2 tablets a day for 5 days then 1 tablet daily), Disp: 45 tablet, Rfl: 3  Review of Systems:   Review of Systems  Constitutional: Negative for fever, chills, weight loss, malaise/fatigue and diaphoresis.  HENT: Negative for hearing loss, ear pain, nosebleeds, congestion, sore throat, neck pain, tinnitus and ear discharge.   Eyes: Negative for blurred vision, double vision, photophobia, pain, discharge and redness.  Respiratory: Negative for cough, hemoptysis, sputum production, shortness of breath, wheezing and stridor.   Cardiovascular: Negative for chest pain, palpitations, orthopnea, claudication, leg swelling and PND.  Gastrointestinal: Positive for abdominal pain. Negative for heartburn, nausea, vomiting, diarrhea, constipation, blood in stool and melena.  Genitourinary: Negative for dysuria, urgency, frequency, hematuria and flank pain.  Musculoskeletal: Negative for myalgias, back pain, joint pain and falls.  Skin: Negative for itching and rash.  Neurological: Negative for dizziness, tingling, tremors, sensory change, speech change, focal weakness, seizures, loss of consciousness, weakness and headaches.  Endo/Heme/Allergies: Negative for environmental allergies and polydipsia. Does not bruise/bleed easily.  Psychiatric/Behavioral: Negative for depression, suicidal ideas, hallucinations, memory loss and substance abuse. The patient is not nervous/anxious and does not have insomnia.      PHYSICAL EXAM:  There were no vitals taken for this visit.    Vitals reviewed. Constitutional: She is oriented to person, place, and time. She appears well-developed and well-nourished.  HENT:  Head: Normocephalic and atraumatic.  Right Ear: External ear normal.  Left Ear: External ear normal.  Nose: Nose normal.  Mouth/Throat:  Oropharynx is clear and moist.  Eyes: Conjunctivae and EOM are normal. Pupils are equal, round, and reactive to light. Right eye exhibits no discharge. Left eye exhibits no discharge. No scleral icterus.  Neck: Normal range of motion. Neck supple. No tracheal deviation present. No thyromegaly present.  Cardiovascular: Normal rate, regular rhythm, normal heart sounds and intact distal pulses.  Exam reveals no gallop and no friction rub.   No murmur heard. Respiratory: Effort normal and breath sounds normal. No respiratory distress. She has  no wheezes. She has no rales. She exhibits no tenderness.  GI: Soft. Bowel sounds are normal. She exhibits no distension and no mass. There is tenderness. There is no rebound and no guarding.  Genitourinary:       Vulva is normal without lesions Vagina is pink moist without discharge Cervix normal in appearance and pap is normal Uterus is 20 week size Adnexa is negative with normal sized ovaries by sonogram  Musculoskeletal: Normal range of motion. She exhibits no edema and no tenderness.  Neurological: She is alert and oriented to person, place, and time. She has normal reflexes. She displays normal reflexes. No cranial nerve deficit. She exhibits normal muscle tone. Coordination normal.  Skin: Skin is warm and dry. No rash noted. No erythema. No pallor.  Psychiatric: She has a normal mood and affect. Her behavior is normal. Judgment and thought content normal.    Labs: No results found for this or any previous visit (from the past 336 hour(s)).  EKG:    Orders placed or performed during the hospital encounter of 11/02/13  . EKG 12-Lead  . EKG 12-Lead  . EKG 12-Lead  . EKG 12-Lead  . EKG 12-Lead  . EKG 12-Lead  . EKG    Imaging Studies:  Imaging Results  US Pelvis (transabdominal Only)  Result Date: 05/17/2018 GYNECOLOGIC SONOGRAM Caitlin Fritz is a 47 y.o. G1P0100 unknown LMP she is here for a pelvic sonogram for  menometrorrhagia,dysmenorrhea. Uterus                      18.3 x 11.7 x 13.8 cm, enlarged heterogeneous uterus w/mult fibroids,uterine volume is approximally 1553 ml Endometrium          23 mm, symmetrical, thickened endometrium, appears to be distorted by fibroids Right ovary             3.4 x 2.8 x 3.7 cm, wnl,limited view Left ovary                3.3 x 2.8 x 2.6 cm, wnl,limited view Fibroids                  (#1) calcified fundal fibroid  12 x 8 x 6.1 cm,(#2) calcified submucosal fibroid right uterus 9.6 x  9.8 x 7.9 cm,(#3) ? submucosal posterior right fibroid 7.5 x 6.5 x 7 cm,(#4) subserosal anterior left 4.8 x 5.4 x 5.2 cm Technician Comments: PELVIC US: TA only because of fibroids,enlarged heterogeneous uterus w/mult fibroids,uterine volume is approximally 1553 ml, (#1) calcified fundal fibroid  12 x 8 x 6.1 cm,(#2) calcified submucosal fibroid right uterus 9.6 x  9.8 x 7.9 cm,(#3) ? submucosal posterior right fibroid 7.5 x 6.5 x 7 cm,(#4) subserosal anterior left 4.8 x 5.4 x 5.2 cm,normal ovaries bilat,thickened endometrium 23 mm,endometrium appears to be distorted by fibroids C[place]lin Fritz 05/16/2018 2:18 PM Clinical Impression and recommendations: I have reviewed the sonogram results above, combined with the patient's current clinical course, below are my impressions and any appropriate recommendations for management based on the sonographic findings. Enlarged fibroid uterus with thickened endometrium due to chronic megestrol therapy Ovaries appear normal C[place]lin Fritz 05/17/2018 7:24 AM       Assessment: 20 week size fibroid uterus Menometrorrhagia Dysmenorrhea        Patient Active Problem List   Diagnosis Date Noted  . History of ischemic right ICA stroke 01/17/2018  . Obesity (BMI 30.0-34.9) 12/23/2016  . Hypertension associated with diabetes ([place]) 12/01/2016  .  Type 2 diabetes mellitus with diabetic neuropathy, unspecified ([place]) 12/01/2016  . STD exposure 08/08/2014  . TIA  (transient ischemic attack) 11/02/2013  . Adjustment disorder with mixed anxiety and depressed mood 09/24/2013  . Diabetic neuropathy, painful ([place]) 07/24/2013  . Dyslipidemia 06/25/2013  . Varicose veins of lower extremities with other complications 70/96/2836  . Asthma, chronic 06/11/2013  . Hidradenitis suppurativa 06/11/2013  . DM2 (diabetes mellitus, type 2) ([place]) 06/11/2013    Plan: Abdominal supracervical hysterectomy with removal of both tubes and ovaries  Pt understands the risks of surgery including but not limited t  excessive bleeding requiring transfusion or reoperation, post-operative infection requiring prolonged hospitalization or re-hospitalization and antibiotic therapy, and damage to other organs including bladder, bowel, ureters and major vessels.  The patient also understands the alternative treatment options which were discussed in full.  All questions were answered.  C[place]lin Fritz 06/13/2018 11:03 AM

## 2018-07-05 NOTE — Anesthesia Postprocedure Evaluation (Signed)
Anesthesia Post Note  Patient: Materials engineer  Procedure(s) Performed: HYSTERECTOMY ABDOMINAL (N/A ) SALPINGO OOPHORECTOMY (Bilateral )  Patient location during evaluation: PACU Anesthesia Type: General Level of consciousness: awake and alert Pain management: pain level controlled Vital Signs Assessment: post-procedure vital signs reviewed and stable Respiratory status: spontaneous breathing and patient connected to nasal cannula oxygen Cardiovascular status: blood pressure returned to baseline Postop Assessment: no apparent nausea or vomiting Anesthetic complications: no     Last Vitals:  Vitals:   07/05/18 0643  BP: (!) 159/90  Pulse: 61  Resp: 20  Temp: 36.7 C  SpO2: 98%    Last Pain:  Vitals:   07/05/18 0643  TempSrc: Oral  PainSc:                  Caitlin Fritz

## 2018-07-05 NOTE — Op Note (Signed)
Preoperative diagnosis:  1.  20 week size fibroid uterus                                          2.  menometrorrhagia                                         3.  dysmenorrhea                                         4.    Postoperative diagnosis:  Same as above   Procedure:  Abdominal hysterectomy, supracervical with removal of both tubes and ovaries  Surgeon:  Florian Buff  Assistant:    Anesthesia:  General endotracheal  Preoperative clinical summary:   Caitlin Fritz a 47 y.o.G1P0100 with No LMP recorded.admitted for a abdominal supracervical hysterectomy with removal of both tubes and ovaries due to 20 week size fibroids with heavy painful periods. Have anaged on megestrol up until now Pt understands the risks of peri operative risks of clots  Intraoperative findings: 20 week uterus multiple fibroids >20 Normal tubes and ovaries  Description of operation:  Patient was taken to the operating room and placed in the supine position where she underwent general endotracheal anesthesia.  She was then prepped and draped in the usual sterile fashion and a Foley catheter was placed for continuous bladder drainage.  A Pfannenstiel skin incision was made and carried down sharply to the rectus fascia which was scored in the midline and extended laterally.  The fascia was taken off the muscles superiorly and inferiorly without difficulty.  The muscles were divided.  The peritoneal cavity was entered..  The upper abdomen was packed away. Both uterine cornu were grasped with Coker clamps.  The left round ligament was suture ligated and coagulated with the electrocautery unit.  The left vesicouterine serosal flap was created.  An avascular window in in the peritoneum was created and the utero-ovarian ligament was cross clamped, cut and suture ligated.  The right round ligament was suture ligated and cut with the electrocautery unit.  The vesicouterine serosal flap on the right was created.  An  avascular window in the peritoneum was created and the right utero-ovarian ligament was cross clamped, cut and double suture ligated.    The uterine vessels were skeletonized bilaterally.  The uterine vessels were clamped bilaterally,  then cut and suture ligated.  Two more pedicles were taken down the cervix medial to the uterine vessels.  Each pedicle was clamped cut and suture ligated with good resulting hemostasis.  As per the preoperative plan the cervix was then transected sharply and the specimen was removed.  The cervical stump was then closed anterior to posterior for hemostasis and reduce postoperative adhesions.  The pelvis was irrigated vigorously and all pedicles were examined and found to be hemostatic.  The infundibulopelvic ligaments were then cross clamped bilaterally, double suture ligated with good hemostasis.   All specimens were sent to pathology for routine evaluation. The pelvis was irrigated.  All packs were removed and all counts were correct at this point x 3.  3 cc of arista was placed in the pelvis.The muscles and peritoneum were reapproximated loosely.  The fascia was closed with 0 Vicryl running.  The skin was closed using 3-0 Vicryl on a Keith needle in a subcuticular fashion.  Dermabond was then applied for additional wound integrity and to serve as a postoperative bacterial barrier.  The patient was awakened from anesthesia taken to the recovery room in good stable condition. All sponge instrument and needle counts were correct x 3.  The patient received Ancef and Toradol prophylactically preoperatively.  Estimated blood loss for the procedure was 400  Cc.     Florian Buff, MD 07/05/2018 9:32 AM

## 2018-07-05 NOTE — Interval H&P Note (Signed)
History and Physical Interval Note:  07/05/2018 7:06 AM  Caitlin Fritz  has presented today for surgery, with the diagnosis of Fibroids Menometrorrhagia Dysmenorrhea  The various methods of treatment have been discussed with the patient and family. After consideration of risks, benefits and other options for treatment, the patient has consented to  Procedure(s): HYSTERECTOMY ABDOMINAL (N/A) SALPINGO OOPHORECTOMY (Bilateral) as a surgical intervention .  The patient's history has been reviewed, patient examined, no change in status, stable for surgery.  I have reviewed the patient's chart and labs.  Questions were answered to the patient's satisfaction.     Florian Buff

## 2018-07-05 NOTE — Anesthesia Procedure Notes (Signed)
Procedure Name: Intubation Date/Time: 07/05/2018 7:33 AM Performed by: Talbot Grumbling, CRNA Pre-anesthesia Checklist: Patient identified, Emergency Drugs available, Suction available and Patient being monitored Patient Re-evaluated:Patient Re-evaluated prior to induction Oxygen Delivery Method: Circle system utilized Preoxygenation: Pre-oxygenation with 100% oxygen Induction Type: IV induction Ventilation: Mask ventilation without difficulty Laryngoscope Size: Mac and 3 Grade View: Grade I Tube type: Oral Tube size: 7.0 mm Number of attempts: 1 Airway Equipment and Method: Stylet Placement Confirmation: ETT inserted through vocal cords under direct vision,  positive ETCO2 and breath sounds checked- equal and bilateral Secured at: 22 cm Tube secured with: Tape Dental Injury: Teeth and Oropharynx as per pre-operative assessment

## 2018-07-05 NOTE — Anesthesia Preprocedure Evaluation (Signed)
Anesthesia Evaluation  Patient identified by MRN, date of birth, ID band Patient awake    Reviewed: Allergy & Precautions, H&P , NPO status , Patient's Chart, lab work & pertinent test results, reviewed documented beta blocker date and time   Airway Mallampati: III  TM Distance: >3 FB Neck ROM: full    Dental no notable dental hx. (+) Teeth Intact   Pulmonary neg pulmonary ROS, asthma , Current Smoker,    Pulmonary exam normal breath sounds clear to auscultation       Cardiovascular Exercise Tolerance: Good hypertension, On Medications negative cardio ROS   Rhythm:regular Rate:Normal     Neuro/Psych Seizures -,  PSYCHIATRIC DISORDERS Depression TIACVA negative neurological ROS  negative psych ROS   GI/Hepatic negative GI ROS, Neg liver ROS, GERD  ,  Endo/Other  negative endocrine ROSdiabetes  Renal/GU negative Renal ROS  negative genitourinary   Musculoskeletal   Abdominal   Peds  Hematology negative hematology ROS (+) anemia ,   Anesthesia Other Findings NSR 87 with ST changes Obesity Denies asthma- states bronchitis One time [facility]/ Clonidine use CVA/TIA Jan, 2019  Denies CP/MI hx  Reproductive/Obstetrics negative OB ROS                             Anesthesia Physical Anesthesia Plan  ASA: III  Anesthesia Plan: General   Post-op Pain Management:    Induction:   PONV Risk Score and Plan:   Airway Management Planned:   Additional Equipment:   Intra-op Plan:   Post-operative Plan:   Informed Consent: I have reviewed the patients History and Physical, chart, labs and discussed the procedure including the risks, benefits and alternatives for the proposed anesthesia with the patient or authorized representative who has indicated his/her understanding and acceptance.   Dental Advisory Given  Plan Discussed with: CRNA and Anesthesiologist  Anesthesia Plan Comments:          Anesthesia Quick Evaluation

## 2018-07-06 ENCOUNTER — Encounter (HOSPITAL_COMMUNITY): Payer: Self-pay | Admitting: Obstetrics & Gynecology

## 2018-07-06 LAB — BASIC METABOLIC PANEL
ANION GAP: 5 (ref 5–15)
BUN: 8 mg/dL (ref 6–20)
CALCIUM: 8.5 mg/dL — AB (ref 8.9–10.3)
CO2: 25 mmol/L (ref 22–32)
Chloride: 106 mmol/L (ref 98–111)
Creatinine, Ser: 0.63 mg/dL (ref 0.44–1.00)
GFR calc Af Amer: >60 mL/min (ref >60)
GFR calc non Af Amer: >60 mL/min (ref >60)
GLUCOSE: 268 mg/dL — AB (ref 70–99)
Potassium: 4.2 mmol/L (ref 3.5–5.1)
Sodium: 136 mmol/L (ref 135–145)

## 2018-07-06 LAB — CBC
HEMATOCRIT: 37.8 % (ref 36.0–46.0)
HEMOGLOBIN: 12.1 g/dL (ref 12.0–15.0)
MCH: 30.2 pg (ref 26.0–34.0)
MCHC: 32 g/dL (ref 30.0–36.0)
MCV: 94.3 fL (ref 78.0–100.0)
Platelets: 201 10*3/uL (ref 150–400)
RBC: 4.01 MIL/uL (ref 3.87–5.11)
RDW: 13.8 % (ref 11.5–15.5)
WBC: 9.8 10*3/uL (ref 4.0–10.5)

## 2018-07-06 MED ORDER — KETOROLAC TROMETHAMINE 10 MG PO TABS: 10.0000 mg | ORAL_TABLET | Freq: Three times a day (TID) | ORAL | 0 refills | Status: DC | PRN

## 2018-07-06 MED ORDER — ONDANSETRON HCL 8 MG PO TABS: 8.0000 mg | ORAL_TABLET | Freq: Four times a day (QID) | ORAL | 0 refills | Status: DC | PRN

## 2018-07-06 MED ORDER — HYDROMORPHONE HCL 2 MG PO TABS
2.0000 mg | ORAL_TABLET | ORAL | Status: DC | PRN
  Administered 2018-07-06: 2 mg via ORAL
  Filled 2018-07-06: qty 1

## 2018-07-06 MED ORDER — HYDROMORPHONE HCL 2 MG PO TABS: 2.0000 mg | ORAL_TABLET | ORAL | 0 refills | Status: DC | PRN

## 2018-07-06 NOTE — Progress Notes (Addendum)
Inpatient Diabetes Program Recommendations  AACE/ADA: New Consensus Statement on Inpatient Glycemic Control (2015)  Target Ranges:  Prepandial:   less than 140 mg/dL      Peak postprandial:   less than 180 mg/dL (1-2 hours)      Critically ill patients:  140 - 180 mg/dL   Review of Glycemic Control  Diabetes history: DM 2 Outpatient Diabetes medications: Lantus 15 units, Metformin 1000 mg BID Current orders for Inpatient glycemic control: Lantus 15 units, Metformin 1000 mg BID  Inpatient Diabetes Program Recommendations:    Consider CBG checks and also ordering Novolog Moderate Correction scale 0-15 units tid with the Glycemic Control order set. While here. Glucose trends are elevated in the mid 200 range.  Thanks,  Tama Headings RN, MSN, BC-ADM Inpatient Diabetes Coordinator Team Pager [phone] (8a-5p)

## 2018-07-06 NOTE — Discharge Summary (Signed)
Physician Discharge Summary  Patient ID: Caitlin Fritz MRN: [id number] DOB/AGE: 1971/02/14 47 y.o.  Admit date: 07/05/2018 Discharge date: 07/06/2018  Admission Diagnoses: Fibroid, menometrorrhagia dysmenorrhea Discharge Diagnoses:  Active Problems:   S/P hysterectomy   Discharged Condition: good  Hospital Course: unremarkable  Consults: None  Significant Diagnostic Studies:   Results for orders placed or performed during the hospital encounter of 07/05/18 (from the past 24 hour(s))  Glucose, capillary     Status: Abnormal   Collection Time: 07/05/18 10:43 PM  Result Value Ref Range   Glucose-Capillary 294 (H) 70 - 99 mg/dL  CBC     Status: None   Collection Time: 07/06/18  5:38 AM  Result Value Ref Range   WBC 9.8 4.0 - 10.5 K/uL   RBC 4.01 3.87 - 5.11 MIL/uL   Hemoglobin 12.1 12.0 - 15.0 g/dL   HCT 37.8 36.0 - 46.0 %   MCV 94.3 78.0 - 100.0 fL   MCH 30.2 26.0 - 34.0 pg   MCHC 32.0 30.0 - 36.0 g/dL   RDW 13.8 11.5 - 15.5 %   Platelets 201 150 - 400 K/uL  Basic metabolic panel     Status: Abnormal   Collection Time: 07/06/18  5:38 AM  Result Value Ref Range   Sodium 136 135 - 145 mmol/L   Potassium 4.2 3.5 - 5.1 mmol/L   Chloride 106 98 - 111 mmol/L   CO2 25 22 - 32 mmol/L   Glucose, Bld 268 (H) 70 - 99 mg/dL   BUN 8 6 - 20 mg/dL   Creatinine, Ser 0.63 0.44 - 1.00 mg/dL   Calcium 8.5 (L) 8.9 - 10.3 mg/dL   GFR calc non Af Amer >60 >60 mL/min   GFR calc Af Amer >60 >60 mL/min   Anion gap 5 5 - 15    Treatments: surgery: supracervical hysterectomy with BSO  Discharge Exam: Blood pressure 139/73, pulse 67, temperature 99 F (37.2 C), temperature source Oral, resp. rate 16, height 5\' 4"  (1.626 m), weight 93.3 kg, SpO2 98 %. General appearance: alert, cooperative and no distress GI: soft, non-tender; bowel sounds normal; no masses,  no organomegaly and incision clean dry intact  Disposition: Discharge disposition: 01-Home or Self Care       Discharge  Instructions    Call MD for:  persistant nausea and vomiting   Complete by:  As directed    Call MD for:  severe uncontrolled pain   Complete by:  As directed    Call MD for:  temperature >100.4   Complete by:  As directed    Diet - low sodium heart healthy   Complete by:  As directed    Driving Restrictions   Complete by:  As directed    No driving for 10 days   Increase activity slowly   Complete by:  As directed    Leave dressing on - Keep it clean, dry, and intact until clinic visit   Complete by:  As directed    Lifting restrictions   Complete by:  As directed    Do not lift more than 10 pounds   Sexual Activity Restrictions   Complete by:  As directed    Not hardly!!!     Allergies as of 07/06/2018      Reactions   Clonidine Derivatives Other (See Comments)   Seizures    Prednisone Other (See Comments)   Eye drops only-temporary blindness Chemical burn in eyes    Adhesive [tape] Rash  Prednisolone Rash, Other (See Comments)   Chemical burn in eyes      Medication List    STOP taking these medications   enoxaparin 40 MG/0.4ML injection Commonly known as:  LOVENOX   ferrous sulfate 325 (65 FE) MG tablet   megestrol 40 MG tablet Commonly known as:  MEGACE     TAKE these medications   amLODipine 10 MG tablet Commonly known as:  NORVASC Take 1 tablet (10 mg total) by mouth daily.   aspirin 325 MG tablet Take 325 mg by mouth daily.   atorvastatin 40 MG tablet Commonly known as:  LIPITOR TAKE 1 Tablet BY MOUTH ONCE DAILY   clopidogrel 75 MG tablet Commonly known as:  PLAVIX Take 1 tablet (75 mg total) by mouth daily.   diclofenac sodium 1 % Gel Commonly known as:  VOLTAREN Apply 2 g topically 4 (four) times daily. What changed:    when to take this  reasons to take this   DULoxetine 20 MG capsule Commonly known as:  CYMBALTA Take 1 capsule (20 mg total) by mouth daily.   HYDROmorphone 2 MG tablet Commonly known as:  DILAUDID Take 1-2  tablets (2-4 mg total) by mouth every 4 (four) hours as needed for moderate pain or severe pain.   Insulin Glargine 100 UNIT/ML Solostar Pen Commonly known as:  LANTUS Inject 15 Units into the skin at bedtime.   ketorolac 10 MG tablet Commonly known as:  TORADOL Take 1 tablet (10 mg total) by mouth every 8 (eight) hours as needed.   losartan 100 MG tablet Commonly known as:  COZAAR Take 1 tablet (100 mg total) by mouth daily.   metFORMIN 1000 MG tablet Commonly known as:  GLUCOPHAGE Take 1 tablet (1,000 mg total) by mouth 2 (two) times daily with a meal.   metoprolol tartrate 100 MG tablet Commonly known as:  LOPRESSOR Take 1 tablet (100 mg total) by mouth 2 (two) times daily.   ondansetron 8 MG tablet Commonly known as:  ZOFRAN Take 1 tablet (8 mg total) by mouth every 6 (six) hours as needed for nausea.      Follow-up Information    Florian Buff, MD Follow up on 07/13/2018.   Specialties:  Obstetrics and Gynecology, Radiology Why:  post op visit Contact information: [place] [address] [phone]           Signed: Florian Buff 07/06/2018, 12:52 PM

## 2018-07-06 NOTE — Discharge Instructions (Signed)
Abdominal Hysterectomy, Care After °This sheet gives you information about how to care for yourself after your procedure. Your health care provider may also give you more specific instructions. If you have problems or questions, contact your health care provider. °What can I expect after the procedure? °After your procedure, it is common to have: °· Pain. °· Fatigue. °· Poor appetite. °· Less interest in sex. °· Vaginal bleeding and discharge. You may need to use a sanitary napkin after this procedure. ° °Follow these instructions at home: °Bathing °· Do not take baths, swim, or use a hot tub until your health care provider approves. Ask your health care provider if you can take showers. You may only be allowed to take sponge baths for bathing. °· Keep the bandage (dressing) dry until your health care provider says it can be removed. °Incision care °· Follow instructions from your health care provider about how to take care of your incision. Make sure you: °? Wash your hands with soap and water before you change your bandage (dressing). If soap and water are not available, use hand sanitizer. °? Change your dressing as told by your health care provider. °? Leave stitches (sutures), skin glue, or adhesive strips in place. These skin closures may need to stay in place for 2 weeks or longer. If adhesive strip edges start to loosen and curl up, you may trim the loose edges. Do not remove adhesive strips completely unless your health care provider tells you to do that. °· Check your incision area every day for signs of infection. Check for: °? Redness, swelling, or pain. °? Fluid or blood. °? Warmth. °? Pus or a bad smell. °Activity °· Do gentle, daily exercises as told by your health care provider. You may be told to take short walks every day and go farther each time. °· Do not lift anything that is heavier than 10 lb (4.5 kg), or the limit that your health care provider tells you, until he or she says that it is  safe. °· Do not drive or use heavy machinery while taking prescription pain medicine. °· Do not drive for 24 hours if you were given a medicine to help you relax (sedative). °· Follow your health care provider's instructions about exercise, driving, and general activities. Ask your health care provider what activities are safe for you. °Lifestyle °· Do not douche, use tampons, or have sex for at least 6 weeks or as told by your health care provider. °· Do not drink alcohol until your health care provider approves. °· Drink enough fluid to keep your urine clear or pale yellow. °· Try to have someone at home with you for the first 1-2 weeks to help. °· Do not use any products that contain nicotine or tobacco, such as cigarettes and e-cigarettes. These can delay healing. If you need help quitting, ask your health care provider. °General instructions °· Take over-the-counter and prescription medicines only as told by your health care provider. °· Do not take aspirin or ibuprofen. These medicines can cause bleeding. °· To prevent or treat constipation while you are taking prescription pain medicine, your health care provider may recommend that you: °? Drink enough fluid to keep your urine clear or pale yellow. °? Take over-the-counter or prescription medicines. °? Eat foods that are high in fiber, such as fresh fruits and vegetables, whole grains, and beans. °? Limit foods that are high in fat and processed sugars, such as fried and sweet foods. °· Keep all   follow-up visits as told by your health care provider. This is important. °Contact a health care provider if: °· You have chills or fever. °· You have redness, swelling, or pain around your incision. °· You have fluid or blood coming from your incision. °· Your incision feels warm to the touch. °· You have pus or a bad smell coming from your incision. °· Your incision breaks open. °· You feel dizzy or light-headed. °· You have pain or bleeding when you urinate. °· You  have persistent diarrhea. °· You have persistent nausea and vomiting. °· You have abnormal vaginal discharge. °· You have a rash. °· You have any type of abnormal reaction or you develop an allergy to your medicine. °· Your pain medicine does not help. °Get help right away if: °· You have a fever and your symptoms suddenly get worse. °· You have severe abdominal pain. °· You have shortness of breath. °· You faint. °· You have pain, swelling, or redness in your leg. °· You have heavy vaginal bleeding with blood clots. °Summary °· After your procedure, it is common to have pain, fatigue and vaginal discharge. °· Do not take baths, swim, or use a hot tub until your health care provider approves. Ask your health care provider if you can take showers. You may only be allowed to take sponge baths for bathing. °· Follow your health care provider's instructions about exercise, driving, and general activities. Ask your health care provider what activities are safe for you. °· Do not lift anything that is heavier than 10 lb (4.5 kg), or the limit that your health care provider tells you, until he or she says that it is safe. °· Try to have someone at home with you for the first 1-2 weeks to help. °This information is not intended to replace advice given to you by your health care provider. Make sure you discuss any questions you have with your health care provider. °Document Released: 04/09/2005 Document Revised: 09/08/2016 Document Reviewed: 09/08/2016 °Elsevier Interactive Patient Education © 2017 [employer] ° °

## 2018-07-06 NOTE — Progress Notes (Signed)
Pt discharged home today per Dr. Elonda Husky. Pt's IV site D/C'd and WDL. Pt's VSS. Pt provided with home medication list, discharge instructions and prescriptions. Verbalized understanding. Pt left floor via WC in stable condition accompanied by NT.

## 2018-07-10 ENCOUNTER — Other Ambulatory Visit: Payer: Self-pay | Admitting: Physician Assistant

## 2018-07-13 ENCOUNTER — Encounter: Payer: Self-pay | Admitting: Obstetrics & Gynecology

## 2018-07-13 ENCOUNTER — Other Ambulatory Visit: Payer: Self-pay | Admitting: Physician Assistant

## 2018-07-13 ENCOUNTER — Ambulatory Visit (INDEPENDENT_AMBULATORY_CARE_PROVIDER_SITE_OTHER): Payer: 59 | Admitting: Obstetrics & Gynecology

## 2018-07-13 VITALS — BP 160/85 | HR 67 | Wt 201.0 lb

## 2018-07-13 DIAGNOSIS — Z90722 Acquired absence of ovaries, bilateral: Secondary | ICD-10-CM

## 2018-07-13 DIAGNOSIS — Z90710 Acquired absence of both cervix and uterus: Secondary | ICD-10-CM

## 2018-07-13 DIAGNOSIS — Z9079 Acquired absence of other genital organ(s): Secondary | ICD-10-CM

## 2018-07-13 MED ORDER — KETOROLAC TROMETHAMINE 10 MG PO TABS: 10.0000 mg | ORAL_TABLET | Freq: Three times a day (TID) | ORAL | 0 refills | Status: DC | PRN

## 2018-07-13 MED ORDER — HYDROMORPHONE HCL 2 MG PO TABS: 2.0000 mg | ORAL_TABLET | ORAL | 0 refills | Status: DC | PRN

## 2018-07-13 NOTE — Progress Notes (Signed)
  HPI: Patient returns for routine postoperative follow-up having undergone TAH BSO on 07/05/2018.  The patient's immediate postoperative recovery has been unremarkable. Since hospital discharge the patient reports no problems.   Current Outpatient Medications: amLODipine (NORVASC) 10 MG tablet, Take 1 tablet (10 mg total) by mouth daily., Disp: 90 tablet, Rfl: 3 aspirin 325 MG tablet, Take 325 mg by mouth daily., Disp: , Rfl:  atorvastatin (LIPITOR) 40 MG tablet, TAKE 1 Tablet BY MOUTH ONCE DAILY, Disp: 90 tablet, Rfl: 1 clopidogrel (PLAVIX) 75 MG tablet, Take 1 tablet (75 mg total) by mouth daily., Disp: 30 tablet, Rfl: 0 DULoxetine (CYMBALTA) 20 MG capsule, Take 1 capsule (20 mg total) by mouth daily., Disp: 30 capsule, Rfl: 0 Insulin Glargine (LANTUS SOLOSTAR) 100 UNIT/ML Solostar Pen, Inject 15 Units into the skin at bedtime., Disp: 5 pen, Rfl: PRN losartan (COZAAR) 100 MG tablet, TAKE 1 Tablet BY MOUTH ONCE DAILY, Disp: 90 tablet, Rfl: 1 metFORMIN (GLUCOPHAGE) 1000 MG tablet, Take 1 tablet (1,000 mg total) by mouth 2 (two) times daily with a meal., Disp: 180 tablet, Rfl: 1 metoprolol tartrate (LOPRESSOR) 100 MG tablet, Take 1 tablet (100 mg total) by mouth 2 (two) times daily., Disp: 60 tablet, Rfl: 3 ondansetron (ZOFRAN) 8 MG tablet, Take 1 tablet (8 mg total) by mouth every 6 (six) hours as needed for nausea., Disp: 20 tablet, Rfl: 0 diclofenac sodium (VOLTAREN) 1 % GEL, Apply 2 g topically 4 (four) times daily. (Patient not taking: Reported on 07/13/2018), Disp: 3 Tube, Rfl: 1 HYDROmorphone (DILAUDID) 2 MG tablet, Take 1-2 tablets (2-4 mg total) by mouth every 4 (four) hours as needed for moderate pain or severe pain. (Patient not taking: Reported on 07/13/2018), Disp: 30 tablet, Rfl: 0 ketorolac (TORADOL) 10 MG tablet, Take 1 tablet (10 mg total) by mouth every 8 (eight) hours as needed. (Patient not taking: Reported on 07/13/2018), Disp: 15 tablet, Rfl: 0  No current  facility-administered medications for this visit.     Blood pressure (!) 160/85, pulse 67, weight 201 lb (91.2 kg), last menstrual period 01/31/2018.  Physical Exam: Incision clean dry intact Abdomen soft normal pot op  Diagnostic Tests:   Pathology: benign  Impression: S/p TAH BSO  Plan: Routine post op care Meds ordered this encounter  Medications  . ketorolac (TORADOL) 10 MG tablet    Sig: Take 1 tablet (10 mg total) by mouth every 8 (eight) hours as needed.    Dispense:  15 tablet    Refill:  0  . HYDROmorphone (DILAUDID) 2 MG tablet    Sig: Take 1-2 tablets (2-4 mg total) by mouth every 4 (four) hours as needed for moderate pain or severe pain.    Dispense:  30 tablet    Refill:  0    Follow up: 4  weeks  Florian Buff, MD

## 2018-07-14 DIAGNOSIS — Z029 Encounter for administrative examinations, unspecified: Secondary | ICD-10-CM

## 2018-07-18 ENCOUNTER — Other Ambulatory Visit: Payer: Self-pay | Admitting: Obstetrics & Gynecology

## 2018-07-18 ENCOUNTER — Other Ambulatory Visit: Payer: Self-pay | Admitting: Physician Assistant

## 2018-07-18 ENCOUNTER — Telehealth: Payer: Self-pay | Admitting: *Deleted

## 2018-07-18 MED ORDER — ONDANSETRON HCL 8 MG PO TABS: 8.0000 mg | ORAL_TABLET | Freq: Four times a day (QID) | ORAL | 0 refills | Status: DC | PRN

## 2018-07-18 NOTE — Telephone Encounter (Signed)
done

## 2018-07-18 NOTE — Telephone Encounter (Signed)
Came into the office to drop off more FMLA papers and to request refill on Zofran.  States since she has been moving around more, she has been experiencing more nausea. Please advise.

## 2018-07-19 MED ORDER — DULOXETINE HCL 20 MG PO CPEP: 20.0000 mg | ORAL_CAPSULE | Freq: Every day | ORAL | 0 refills | Status: DC

## 2018-07-19 MED ORDER — CLOPIDOGREL BISULFATE 75 MG PO TABS: 75.0000 mg | ORAL_TABLET | Freq: Every day | ORAL | 0 refills | Status: DC

## 2018-07-27 ENCOUNTER — Other Ambulatory Visit: Payer: Self-pay | Admitting: Obstetrics & Gynecology

## 2018-07-27 MED ORDER — PROMETHAZINE HCL 25 MG PO TABS: 25.0000 mg | ORAL_TABLET | Freq: Four times a day (QID) | ORAL | 1 refills | Status: DC | PRN

## 2018-07-31 ENCOUNTER — Other Ambulatory Visit: Payer: Self-pay | Admitting: Physician Assistant

## 2018-08-08 ENCOUNTER — Ambulatory Visit: Payer: Self-pay | Admitting: Physician Assistant

## 2018-08-10 ENCOUNTER — Encounter: Payer: Self-pay | Admitting: Obstetrics & Gynecology

## 2018-08-10 ENCOUNTER — Ambulatory Visit (INDEPENDENT_AMBULATORY_CARE_PROVIDER_SITE_OTHER): Payer: 59 | Admitting: Obstetrics & Gynecology

## 2018-08-10 VITALS — BP 137/85 | HR 85 | Wt 200.0 lb

## 2018-08-10 DIAGNOSIS — Z90710 Acquired absence of both cervix and uterus: Secondary | ICD-10-CM

## 2018-08-10 DIAGNOSIS — Z90722 Acquired absence of ovaries, bilateral: Secondary | ICD-10-CM

## 2018-08-10 DIAGNOSIS — Z9079 Acquired absence of other genital organ(s): Secondary | ICD-10-CM

## 2018-08-10 MED ORDER — OMEPRAZOLE 20 MG PO CPDR: 20.0000 mg | DELAYED_RELEASE_CAPSULE | Freq: Every day | ORAL | 6 refills | Status: DC

## 2018-08-10 NOTE — Progress Notes (Signed)
  HPI: Patient returns for routine postoperative follow-up having undergone TAH BSO on 07/05/2018.  The patient's immediate postoperative recovery has been unremarkable. Since hospital discharge the patient reports no problems.   Current Outpatient Medications: amLODipine (NORVASC) 10 MG tablet, Take 1 tablet (10 mg total) by mouth daily., Disp: 90 tablet, Rfl: 3 aspirin 325 MG tablet, Take 325 mg by mouth daily., Disp: , Rfl:  atorvastatin (LIPITOR) 40 MG tablet, TAKE 1 Tablet BY MOUTH ONCE DAILY, Disp: 90 tablet, Rfl: 1 clopidogrel (PLAVIX) 75 MG tablet, Take 1 tablet (75 mg total) by mouth daily., Disp: 30 tablet, Rfl: 0 DULoxetine (CYMBALTA) 20 MG capsule, Take 1 capsule (20 mg total) by mouth daily., Disp: 30 capsule, Rfl: 0 Insulin Glargine (LANTUS SOLOSTAR) 100 UNIT/ML Solostar Pen, Inject 15 Units into the skin at bedtime., Disp: 5 pen, Rfl: PRN losartan (COZAAR) 100 MG tablet, TAKE 1 Tablet BY MOUTH ONCE DAILY, Disp: 90 tablet, Rfl: 1 metFORMIN (GLUCOPHAGE) 1000 MG tablet, Take 1 tablet (1,000 mg total) by mouth 2 (two) times daily with a meal., Disp: 180 tablet, Rfl: 1 metoprolol tartrate (LOPRESSOR) 100 MG tablet, Take 1 tablet (100 mg total) by mouth 2 (two) times daily., Disp: 60 tablet, Rfl: 3 ondansetron (ZOFRAN) 8 MG tablet, Take 1 tablet (8 mg total) by mouth every 6 (six) hours as needed for nausea., Disp: 20 tablet, Rfl: 0 diclofenac sodium (VOLTAREN) 1 % GEL, Apply 2 g topically 4 (four) times daily. (Patient not taking: Reported on 07/13/2018), Disp: 3 Tube, Rfl: 1 HYDROmorphone (DILAUDID) 2 MG tablet, Take 1-2 tablets (2-4 mg total) by mouth every 4 (four) hours as needed for moderate pain or severe pain. (Patient not taking: Reported on 07/13/2018), Disp: 30 tablet, Rfl: 0 ketorolac (TORADOL) 10 MG tablet, Take 1 tablet (10 mg total) by mouth every 8 (eight) hours as needed. (Patient not taking: Reported on 07/13/2018), Disp: 15 tablet, Rfl: 0  No current  facility-administered medications for this visit.     Blood pressure (!) 160/85, pulse 67, weight 201 lb (91.2 kg), last menstrual period 01/31/2018.  Physical Exam: Incision clean dry intact Abdomen soft normal pot op  Diagnostic Tests:   Pathology: benign  Impression: S/p TAH BSO  Plan: Routine post op care Meds ordered this encounter  Medications  . omeprazole (PRILOSEC) 20 MG capsule    Sig: Take 1 capsule (20 mg total) by mouth daily. 1 tablet a day    Dispense:  30 capsule    Refill:  6     Follow up: 4  weeks  Florian Buff, MD

## 2018-10-04 ENCOUNTER — Encounter: Payer: Self-pay | Admitting: Physician Assistant

## 2018-10-12 ENCOUNTER — Encounter: Payer: Self-pay | Admitting: Physician Assistant

## 2018-10-12 ENCOUNTER — Ambulatory Visit: Payer: Self-pay | Admitting: Physician Assistant

## 2018-10-12 VITALS — BP 159/86 | HR 71 | Temp 97.7°F | Ht 64.0 in | Wt 205.5 lb

## 2018-10-12 DIAGNOSIS — E1165 Type 2 diabetes mellitus with hyperglycemia: Secondary | ICD-10-CM

## 2018-10-12 DIAGNOSIS — E785 Hyperlipidemia, unspecified: Secondary | ICD-10-CM

## 2018-10-12 DIAGNOSIS — D649 Anemia, unspecified: Secondary | ICD-10-CM

## 2018-10-12 DIAGNOSIS — N938 Other specified abnormal uterine and vaginal bleeding: Secondary | ICD-10-CM

## 2018-10-12 DIAGNOSIS — I10 Essential (primary) hypertension: Secondary | ICD-10-CM

## 2018-10-12 DIAGNOSIS — R4701 Aphasia: Secondary | ICD-10-CM

## 2018-10-12 DIAGNOSIS — Z9119 Patient's noncompliance with other medical treatment and regimen: Secondary | ICD-10-CM

## 2018-10-12 DIAGNOSIS — Z91199 Patient's noncompliance with other medical treatment and regimen due to unspecified reason: Secondary | ICD-10-CM

## 2018-10-12 DIAGNOSIS — F17200 Nicotine dependence, unspecified, uncomplicated: Secondary | ICD-10-CM

## 2018-10-12 DIAGNOSIS — Z7901 Long term (current) use of anticoagulants: Secondary | ICD-10-CM

## 2018-10-12 DIAGNOSIS — Z8673 Personal history of transient ischemic attack (TIA), and cerebral infarction without residual deficits: Secondary | ICD-10-CM

## 2018-10-12 DIAGNOSIS — E669 Obesity, unspecified: Secondary | ICD-10-CM

## 2018-10-12 MED ORDER — METOPROLOL TARTRATE 100 MG PO TABS: 100.0000 mg | ORAL_TABLET | Freq: Two times a day (BID) | ORAL | 0 refills | Status: AC

## 2018-10-12 MED ORDER — DULOXETINE HCL 20 MG PO CPEP: 20.0000 mg | ORAL_CAPSULE | Freq: Every day | ORAL | 0 refills | Status: DC

## 2018-10-12 MED ORDER — CLOPIDOGREL BISULFATE 75 MG PO TABS: 75.0000 mg | ORAL_TABLET | Freq: Every day | ORAL | 0 refills | Status: AC

## 2018-10-12 MED ORDER — INSULIN GLARGINE 100 UNIT/ML SOLOSTAR PEN: 15.0000 [IU] | PEN_INJECTOR | Freq: Every day | SUBCUTANEOUS | 0 refills | Status: AC

## 2018-10-12 MED ORDER — LOSARTAN POTASSIUM 100 MG PO TABS: 100.0000 mg | ORAL_TABLET | Freq: Every day | ORAL | 0 refills | Status: AC

## 2018-10-12 MED ORDER — ATORVASTATIN CALCIUM 40 MG PO TABS: 40.0000 mg | ORAL_TABLET | Freq: Every day | ORAL | 0 refills | Status: AC

## 2018-10-12 MED ORDER — METFORMIN HCL 1000 MG PO TABS: 1000.0000 mg | ORAL_TABLET | Freq: Two times a day (BID) | ORAL | 0 refills | Status: AC

## 2018-10-12 MED ORDER — AMLODIPINE BESYLATE 10 MG PO TABS: 10.0000 mg | ORAL_TABLET | Freq: Every day | ORAL | 0 refills | Status: AC

## 2018-10-12 NOTE — Progress Notes (Signed)
BP (!) 159/86 (BP Location: Right Arm, Patient Position: Sitting, Cuff Size: Large)   Pulse 71   Temp 97.7 F (36.5 C) (Other (Comment))   Ht 5\' 4"  (1.626 m)   Wt 205 lb 8 oz (93.2 kg)   LMP 01/31/2018   SpO2 99%   BMI 35.27 kg/m    Subjective:    Patient ID: Caitlin Fritz, female    DOB: [birth year], 48 y.o.   MRN: [id number]  HPI: Caitlin Fritz is a 48 y.o. female presenting on 10/12/2018 for Follow-up   HPI   pt was last seen here 06/06/18.  She did not get her labs drawn in October as requested and she cancelled her November appointment and is just now returning to office.  She has insurance now.  Pt is aware that she will no longer be a pt here at the [facility] as we do not accept insurance    Relevant past medical, surgical, family and social history reviewed and updated as indicated. Interim medical history since our last visit reviewed. Allergies and medications reviewed and updated.  Review of Systems  Constitutional: Positive for diaphoresis. Negative for appetite change, chills, fatigue, fever and unexpected weight change.  HENT: Negative for congestion, dental problem, drooling, ear pain, facial swelling, hearing loss, mouth sores, sneezing, sore throat, trouble swallowing and voice change.   Eyes: Positive for pain and itching. Negative for discharge, redness and visual disturbance.  Respiratory: Negative for cough, choking, shortness of breath and wheezing.   Cardiovascular: Negative for chest pain, palpitations and leg swelling.  Gastrointestinal: Negative for abdominal pain, blood in stool, constipation, diarrhea and vomiting.  Endocrine: Negative for cold intolerance, heat intolerance and polydipsia.  Genitourinary: Negative for decreased urine volume, dysuria and hematuria.  Musculoskeletal: Positive for arthralgias. Negative for back pain and gait problem.  Skin: Negative for rash.  Allergic/Immunologic: Negative for environmental allergies.   Neurological: Negative for seizures, syncope, light-headedness and headaches.  Hematological: Negative for adenopathy.  Psychiatric/Behavioral: Positive for agitation and dysphoric mood. Negative for suicidal ideas. The patient is nervous/anxious.     Per HPI unless specifically indicated above     Objective:    BP (!) 159/86 (BP Location: Right Arm, Patient Position: Sitting, Cuff Size: Large)   Pulse 71   Temp 97.7 F (36.5 C) (Other (Comment))   Ht 5\' 4"  (1.626 m)   Wt 205 lb 8 oz (93.2 kg)   LMP 01/31/2018   SpO2 99%   BMI 35.27 kg/m   Wt Readings from Last 3 Encounters:  10/12/18 205 lb 8 oz (93.2 kg)  08/10/18 200 lb (90.7 kg)  07/13/18 201 lb (91.2 kg)    Physical Exam Vitals signs reviewed.  Constitutional:      Appearance: She is obese.  HENT:     Head: Normocephalic and atraumatic.  Pulmonary:     Effort: Pulmonary effort is normal. No respiratory distress.  Neurological:     Mental Status: She is alert and oriented to person, place, and time. Mental status is at baseline.  Psychiatric:        Mood and Affect: Mood normal.        Behavior: Behavior normal.         Assessment & Plan:    Encounter Diagnoses  Name Primary?  . Personal history of noncompliance with medical treatment, presenting hazards to health Yes  . Uncontrolled type 2 diabetes mellitus with hyperglycemia ([place])   . Essential hypertension   . Dyslipidemia   .  Obesity, unspecified classification, unspecified obesity type, unspecified whether serious comorbidity present   . Anticoagulant long-term use   . Anemia, unspecified type   . History of ischemic right ICA stroke   . DUB (dysfunctional uterine bleeding)   . Expressive aphasia   . Tobacco use disorder      Pt given refill of all meds and encouraged to start calling to get appointment with new provider.  Discussed taht her labs are past due

## 2018-10-16 ENCOUNTER — Encounter: Payer: Self-pay | Admitting: Advanced Practice Midwife

## 2018-10-16 ENCOUNTER — Ambulatory Visit: Payer: BLUE CROSS/BLUE SHIELD | Admitting: Advanced Practice Midwife

## 2018-10-16 VITALS — BP 145/85 | HR 103 | Ht 66.0 in | Wt 200.0 lb

## 2018-10-16 DIAGNOSIS — N898 Other specified noninflammatory disorders of vagina: Secondary | ICD-10-CM | POA: Diagnosis not present

## 2018-10-16 MED ORDER — VALACYCLOVIR HCL 1 G PO TABS: 1000.0000 mg | ORAL_TABLET | Freq: Two times a day (BID) | ORAL | 0 refills | Status: AC

## 2018-10-16 MED ORDER — LIDOCAINE 5 % EX OINT: 1.0000 "application " | TOPICAL_OINTMENT | CUTANEOUS | 0 refills | Status: AC | PRN

## 2018-10-16 MED ORDER — ONDANSETRON 4 MG PO TBDP: 4.0000 mg | ORAL_TABLET | Freq: Four times a day (QID) | ORAL | 2 refills | Status: AC | PRN

## 2018-10-16 MED ORDER — HYDROCODONE-ACETAMINOPHEN 5-325 MG PO TABS: 1.0000 | ORAL_TABLET | Freq: Four times a day (QID) | ORAL | 0 refills | Status: DC | PRN

## 2018-10-16 NOTE — Addendum Note (Signed)
Addended by: Christiana Pellant A on: 10/16/2018 04:24 PM   Modules accepted: Orders

## 2018-10-16 NOTE — Progress Notes (Signed)
WORK IN FOR SWOLLEN VAGINA, FEELS HARD TO TOUCH ON BOTH SIDES.  STARTED THIS WEEKEND, NO SEX SINCE 12/14       [facility] Visit  Patient name: Caitlin Fritz MRN [id number]  Date of birth: 24-Apr-1971  CC & HPI:  Caitlin Fritz is a 47 y.o. African American female presenting today for painful vagina as describe above. .   Had supracervical TAH/BSO 10/19 for fibroids. Has tried warm baths, epsom salt.   Pertinent History Reviewed:  Medical & Surgical Hx:   Past Medical History:  Diagnosis Date  . Anemia   . Asthma   . Bronchitis   . Depression   . Diabetes mellitus without complication [facility])    diagnosed age 47  . GERD (gastroesophageal reflux disease)   . History of kidney stones   . Hydradenitis   . Hypertension   . Seizure ([place])    had one seizure after taking clonidine. No more seizures  . Seizures ([place])    once after taking clonidine  . Stroke [facility])    mini stroke June 2015, regular stroke Jan. 29, 2019;ST memory deficits   Past Surgical History:  Procedure Laterality Date  . ABDOMINAL HYSTERECTOMY N/A 07/05/2018   Procedure: HYSTERECTOMY ABDOMINAL;  Surgeon: Florian Buff, MD;  Location: [facility];  Service: Gynecology;  Laterality: N/A;  . INCISE AND DRAIN ABCESS     right inner thigh  . INCISION AND DRAINAGE  2013   bilateral, hydradenitis done at [facility]  . SALPINGOOPHORECTOMY Bilateral 07/05/2018   Procedure: SALPINGO OOPHORECTOMY;  Surgeon: Florian Buff, MD;  Location: [facility];  Service: Gynecology;  Laterality: Bilateral;   Family History  Problem Relation Age of Onset  . Hyperlipidemia Mother   . Hypertension Mother   . Thyroid disease Mother   . Diabetes Father   . Heart disease Father   . Hypertension Father   . Cancer Maternal Grandmother        cervical cancer  . Diabetes Maternal Grandfather   . Diabetes Paternal Grandmother   . Pneumonia Sister   . Preterm labor Son     Current Outpatient Medications:  .  amLODipine  (NORVASC) 10 MG tablet, Take 1 tablet (10 mg total) by mouth daily., Disp: 30 tablet, Rfl: 0 .  aspirin 325 MG tablet, Take 325 mg by mouth daily., Disp: , Rfl:  .  atorvastatin (LIPITOR) 40 MG tablet, Take 1 tablet (40 mg total) by mouth daily., Disp: 30 tablet, Rfl: 0 .  clopidogrel (PLAVIX) 75 MG tablet, Take 1 tablet (75 mg total) by mouth daily., Disp: 30 tablet, Rfl: 0 .  DULoxetine (CYMBALTA) 20 MG capsule, Take 1 capsule (20 mg total) by mouth daily., Disp: 30 capsule, Rfl: 0 .  Insulin Glargine (LANTUS SOLOSTAR) 100 UNIT/ML Solostar Pen, Inject 15 Units into the skin at bedtime., Disp: 1 pen, Rfl: 0 .  losartan (COZAAR) 100 MG tablet, Take 1 tablet (100 mg total) by mouth daily., Disp: 30 tablet, Rfl: 0 .  metFORMIN (GLUCOPHAGE) 1000 MG tablet, Take 1 tablet (1,000 mg total) by mouth 2 (two) times daily with a meal., Disp: 60 tablet, Rfl: 0 .  metoprolol tartrate (LOPRESSOR) 100 MG tablet, Take 1 tablet (100 mg total) by mouth 2 (two) times daily., Disp: 60 tablet, Rfl: 0 .  HYDROcodone-acetaminophen (NORCO/VICODIN) 5-325 MG tablet, Take 1 tablet by mouth every 6 (six) hours as needed for moderate pain., Disp: 20 tablet, Rfl: 0 .  lidocaine (XYLOCAINE) 5 % ointment,  Apply 1 application topically as needed., Disp: 35.44 g, Rfl: 0 .  ondansetron (ZOFRAN ODT) 4 MG disintegrating tablet, Take 1 tablet (4 mg total) by mouth every 6 (six) hours as needed for nausea., Disp: 30 tablet, Rfl: 2 .  valACYclovir (VALTREX) 1000 MG tablet, Take 1 tablet (1,000 mg total) by mouth 2 (two) times daily for 10 days., Disp: 20 tablet, Rfl: 0 Social History: Reviewed -  reports that she has been smoking cigarettes. She has a 10.00 pack-year smoking history. She has never used smokeless tobacco.  Review of Systems:   Constitutional: Negative for fever and chills Eyes: Negative for visual disturbances Respiratory: Negative for shortness of breath, dyspnea Cardiovascular: Negative for chest pain or palpitations   Gastrointestinal: Negative for vomiting, diarrhea and constipation; no abdominal pain Genitourinary: Negative for dysuria and urgency, vaginal irritation or itching Musculoskeletal: Negative for back pain, joint pain, myalgias  Neurological: Negative for dizziness and headaches    Objective Findings:    Physical Examination: Vitals:   10/16/18 1526  BP: (!) 145/85  Pulse: (!) 103   General appearance - well appearing, and in no distress Mental status - alert, oriented to person, place, and time Chest:  Normal respiratory effort Heart - normal rate and regular rhythm Abdomen:  Soft, nontender Pelvic: exquisitely painful.  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  Has hx of hidranitis, but really looks herpatic. Culture taken Musculoskeletal:  Normal range of motion without pain Extremities:  No edema    No results found for this or any previous visit (from the past 24 hour(s)).    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  Joaquim Lai Cresenzo-Dishmon CNM 10/16/2018 4:20 PM

## 2018-10-18 ENCOUNTER — Other Ambulatory Visit: Payer: Self-pay | Admitting: Advanced Practice Midwife

## 2018-10-18 MED ORDER — GABAPENTIN 400 MG PO CAPS: 400.0000 mg | ORAL_CAPSULE | Freq: Three times a day (TID) | ORAL | 1 refills | Status: AC

## 2018-10-19 LAB — HERPES SIMPLEX VIRUS CULTURE

## 2018-11-12 ENCOUNTER — Other Ambulatory Visit: Payer: Self-pay | Admitting: Physician Assistant

## 2018-12-17 IMAGING — DX DG FOOT COMPLETE 3+V*L*
3 series · 3 of 3 positions shown · non-contrast
Comparison: None.

CLINICAL DATA: Pain, swelling across top of foot.  No known injury.

EXAM:
LEFT FOOT - COMPLETE 3+ VIEW

[foot ap]
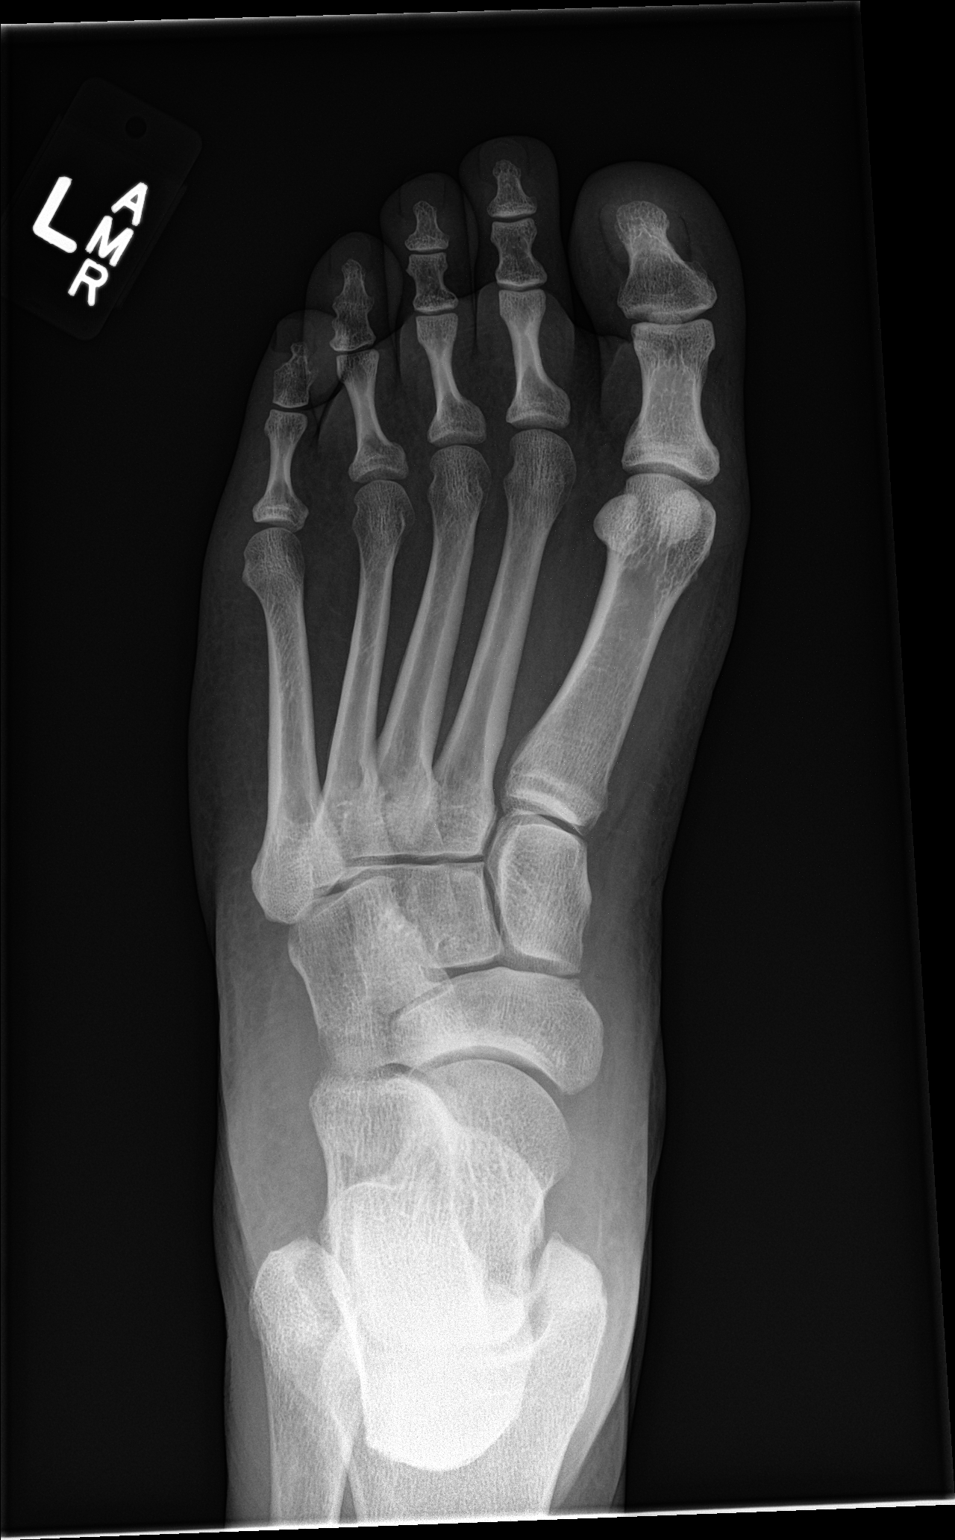

[foot obl]
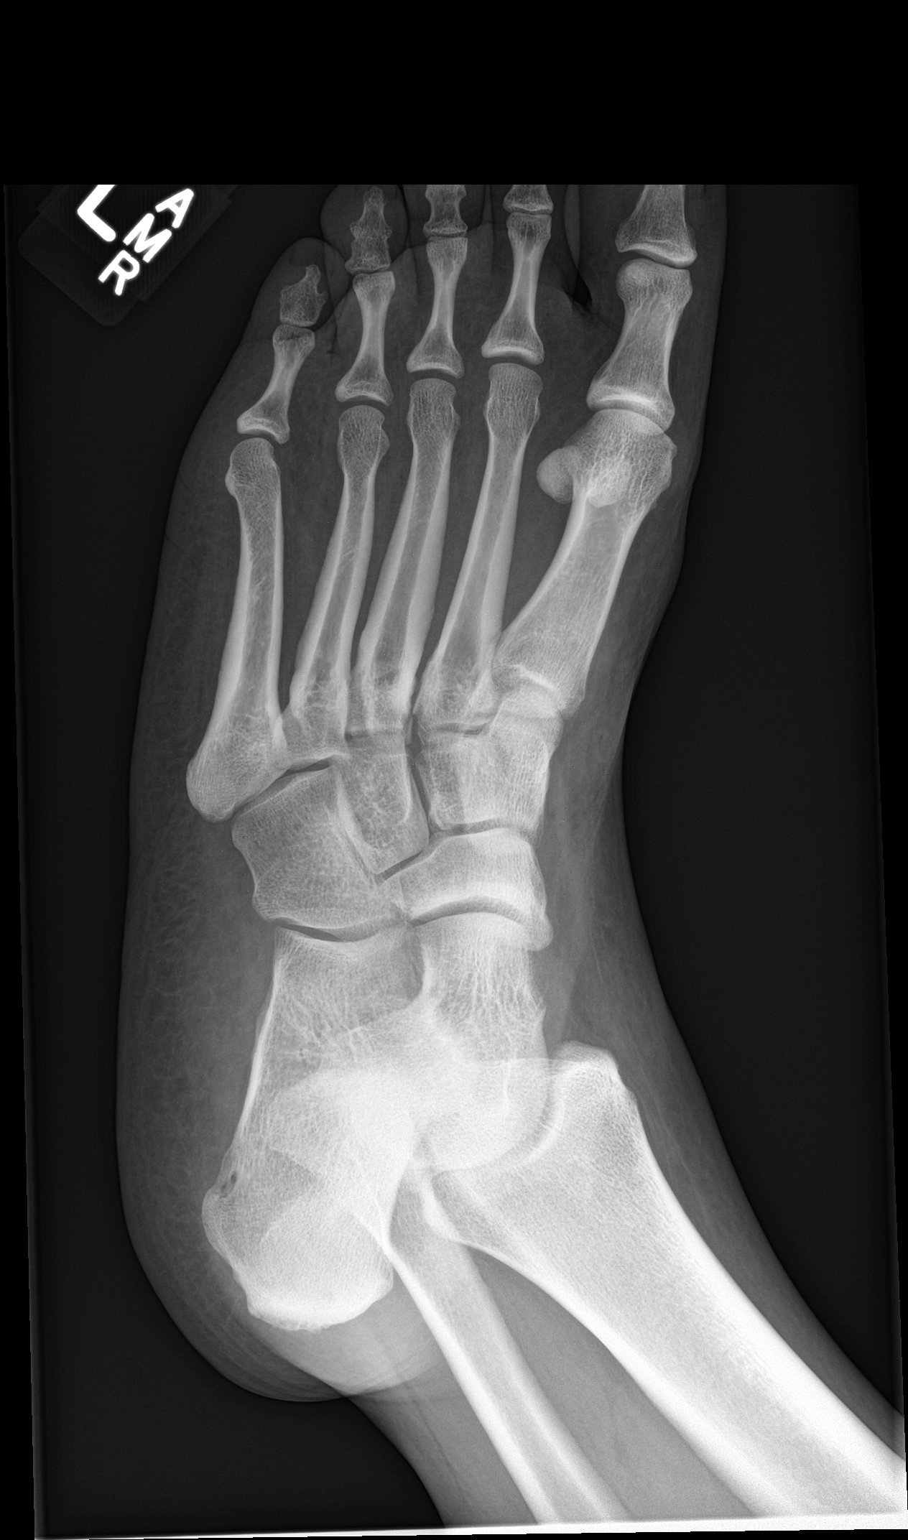

[foot lat]
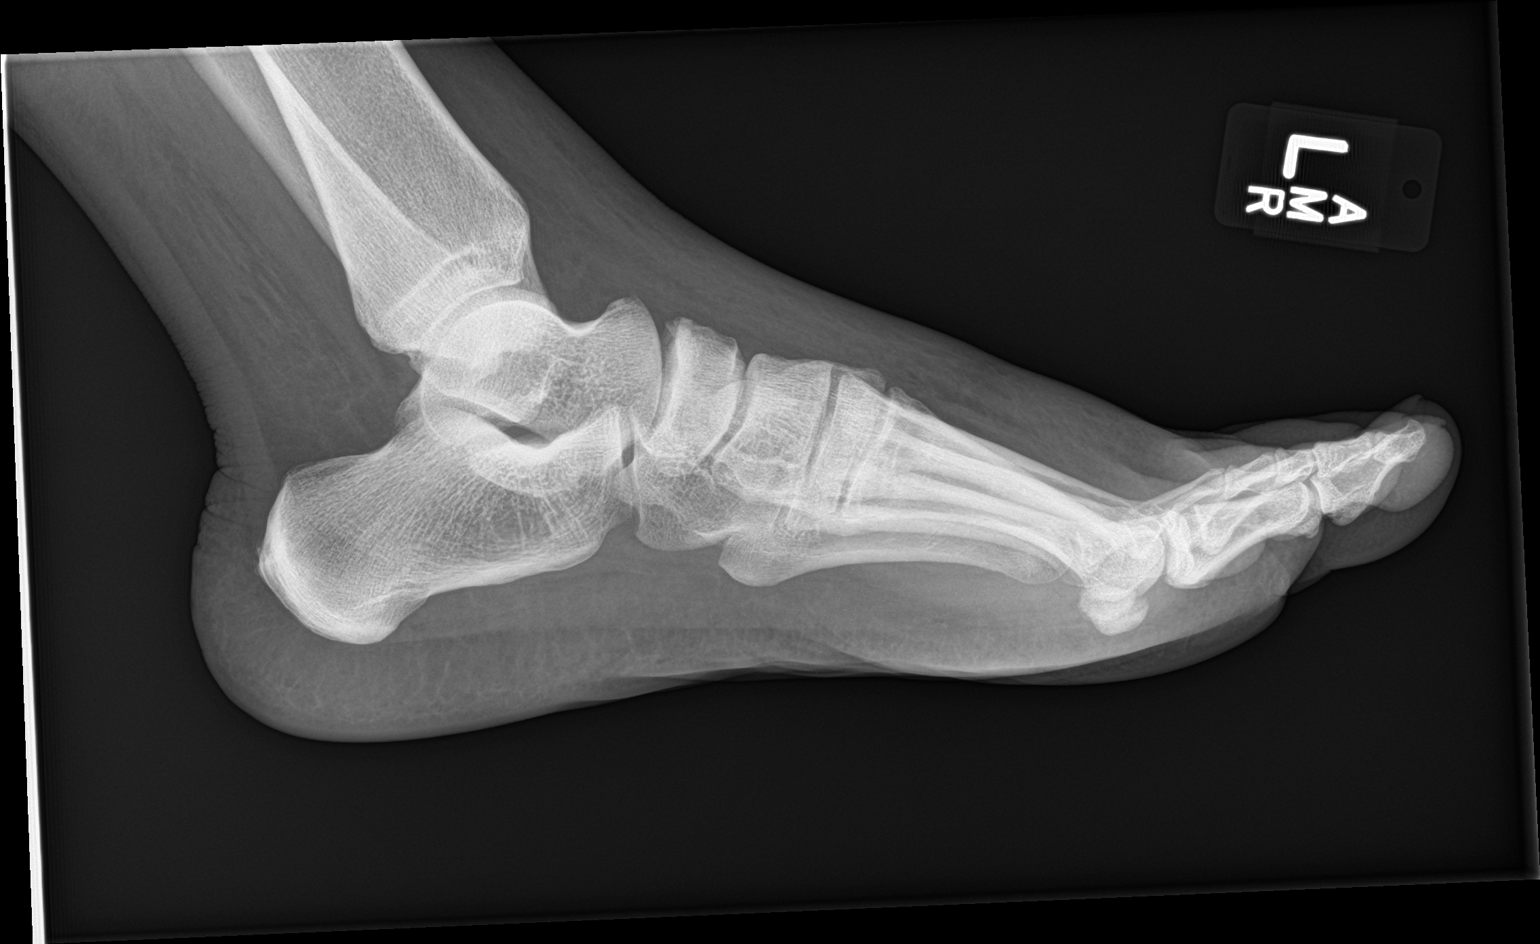

[3 of 3 positions shown; findings below may reference images not displayed]

FINDINGS: No acute bony abnormality. Specifically, no fracture, subluxation,
or dislocation. Soft tissues are intact. Joint spaces maintained.
IMPRESSION: No acute bony abnormality.

## 2019-05-24 ENCOUNTER — Other Ambulatory Visit (HOSPITAL_COMMUNITY): Payer: Self-pay | Admitting: Family Medicine

## 2019-05-28 ENCOUNTER — Other Ambulatory Visit (HOSPITAL_COMMUNITY): Payer: Self-pay | Admitting: Family Medicine

## 2019-05-28 DIAGNOSIS — R928 Other abnormal and inconclusive findings on diagnostic imaging of breast: Secondary | ICD-10-CM

## 2019-06-12 ENCOUNTER — Ambulatory Visit (HOSPITAL_COMMUNITY): Payer: BC Managed Care – PPO

## 2019-06-12 ENCOUNTER — Other Ambulatory Visit: Payer: Self-pay

## 2019-06-12 ENCOUNTER — Ambulatory Visit (HOSPITAL_COMMUNITY)
Admission: RE | Admit: 2019-06-12 | Discharge: 2019-06-12 | Disposition: A | Discharge status: Home / Self Care | Payer: BC Managed Care – PPO | Source: Ambulatory Visit | Attending: Family Medicine | Admitting: Family Medicine

## 2019-06-12 DIAGNOSIS — R928 Other abnormal and inconclusive findings on diagnostic imaging of breast: Secondary | ICD-10-CM | POA: Insufficient documentation

## 2019-08-23 LAB — HM HEPATITIS C SCREENING LAB: HM Hepatitis Screen: NEGATIVE

## 2020-06-13 ENCOUNTER — Other Ambulatory Visit (HOSPITAL_COMMUNITY): Payer: Self-pay | Admitting: Family Medicine

## 2020-06-13 DIAGNOSIS — Z1231 Encounter for screening mammogram for malignant neoplasm of breast: Secondary | ICD-10-CM

## 2020-08-11 ENCOUNTER — Ambulatory Visit (HOSPITAL_COMMUNITY): Payer: BC Managed Care – PPO

## 2020-12-23 DIAGNOSIS — Z8673 Personal history of transient ischemic attack (TIA), and cerebral infarction without residual deficits: Secondary | ICD-10-CM | POA: Diagnosis not present

## 2020-12-23 DIAGNOSIS — I10 Essential (primary) hypertension: Secondary | ICD-10-CM | POA: Diagnosis not present

## 2020-12-23 DIAGNOSIS — E119 Type 2 diabetes mellitus without complications: Secondary | ICD-10-CM | POA: Diagnosis not present

## 2020-12-23 DIAGNOSIS — J208 Acute bronchitis due to other specified organisms: Secondary | ICD-10-CM | POA: Diagnosis not present

## 2020-12-23 DIAGNOSIS — Z720 Tobacco use: Secondary | ICD-10-CM | POA: Diagnosis not present

## 2020-12-23 DIAGNOSIS — J209 Acute bronchitis, unspecified: Secondary | ICD-10-CM | POA: Diagnosis not present

## 2020-12-23 DIAGNOSIS — R062 Wheezing: Secondary | ICD-10-CM | POA: Diagnosis not present

## 2020-12-23 DIAGNOSIS — R059 Cough, unspecified: Secondary | ICD-10-CM | POA: Diagnosis not present

## 2020-12-23 DIAGNOSIS — Z79899 Other long term (current) drug therapy: Secondary | ICD-10-CM | POA: Diagnosis not present

## 2020-12-23 DIAGNOSIS — Z7982 Long term (current) use of aspirin: Secondary | ICD-10-CM | POA: Diagnosis not present

## 2021-01-20 DIAGNOSIS — Z794 Long term (current) use of insulin: Secondary | ICD-10-CM | POA: Diagnosis not present

## 2021-01-20 DIAGNOSIS — Z136 Encounter for screening for cardiovascular disorders: Secondary | ICD-10-CM | POA: Diagnosis not present

## 2021-01-20 DIAGNOSIS — Z13220 Encounter for screening for lipoid disorders: Secondary | ICD-10-CM | POA: Diagnosis not present

## 2021-01-20 DIAGNOSIS — I10 Essential (primary) hypertension: Secondary | ICD-10-CM | POA: Diagnosis not present

## 2021-01-20 DIAGNOSIS — Z0000 Encounter for general adult medical examination without abnormal findings: Secondary | ICD-10-CM | POA: Diagnosis not present

## 2021-01-20 DIAGNOSIS — R69 Illness, unspecified: Secondary | ICD-10-CM | POA: Diagnosis not present

## 2021-01-20 DIAGNOSIS — R5383 Other fatigue: Secondary | ICD-10-CM | POA: Diagnosis not present

## 2021-01-20 DIAGNOSIS — E1140 Type 2 diabetes mellitus with diabetic neuropathy, unspecified: Secondary | ICD-10-CM | POA: Diagnosis not present

## 2021-01-20 DIAGNOSIS — Z8673 Personal history of transient ischemic attack (TIA), and cerebral infarction without residual deficits: Secondary | ICD-10-CM | POA: Diagnosis not present

## 2021-06-11 DIAGNOSIS — Z90710 Acquired absence of both cervix and uterus: Secondary | ICD-10-CM | POA: Diagnosis not present

## 2021-06-11 DIAGNOSIS — E785 Hyperlipidemia, unspecified: Secondary | ICD-10-CM | POA: Diagnosis not present

## 2021-06-11 DIAGNOSIS — Z7984 Long term (current) use of oral hypoglycemic drugs: Secondary | ICD-10-CM | POA: Diagnosis not present

## 2021-06-11 DIAGNOSIS — R69 Illness, unspecified: Secondary | ICD-10-CM | POA: Diagnosis not present

## 2021-06-11 DIAGNOSIS — Z833 Family history of diabetes mellitus: Secondary | ICD-10-CM | POA: Diagnosis not present

## 2021-06-11 DIAGNOSIS — R519 Headache, unspecified: Secondary | ICD-10-CM | POA: Diagnosis not present

## 2021-06-11 DIAGNOSIS — Z8673 Personal history of transient ischemic attack (TIA), and cerebral infarction without residual deficits: Secondary | ICD-10-CM | POA: Diagnosis not present

## 2021-06-11 DIAGNOSIS — H538 Other visual disturbances: Secondary | ICD-10-CM | POA: Diagnosis not present

## 2021-06-11 DIAGNOSIS — U071 COVID-19: Secondary | ICD-10-CM | POA: Diagnosis not present

## 2021-06-11 DIAGNOSIS — Z8249 Family history of ischemic heart disease and other diseases of the circulatory system: Secondary | ICD-10-CM | POA: Diagnosis not present

## 2021-06-11 DIAGNOSIS — I10 Essential (primary) hypertension: Secondary | ICD-10-CM | POA: Diagnosis not present

## 2021-06-11 DIAGNOSIS — E119 Type 2 diabetes mellitus without complications: Secondary | ICD-10-CM | POA: Diagnosis not present

## 2021-07-22 DIAGNOSIS — L732 Hidradenitis suppurativa: Secondary | ICD-10-CM | POA: Diagnosis not present

## 2021-07-22 DIAGNOSIS — R69 Illness, unspecified: Secondary | ICD-10-CM | POA: Diagnosis not present

## 2021-07-22 DIAGNOSIS — Z794 Long term (current) use of insulin: Secondary | ICD-10-CM | POA: Diagnosis not present

## 2021-07-22 DIAGNOSIS — Z6834 Body mass index (BMI) 34.0-34.9, adult: Secondary | ICD-10-CM | POA: Diagnosis not present

## 2021-07-22 DIAGNOSIS — E1140 Type 2 diabetes mellitus with diabetic neuropathy, unspecified: Secondary | ICD-10-CM | POA: Diagnosis not present

## 2021-07-22 DIAGNOSIS — B009 Herpesviral infection, unspecified: Secondary | ICD-10-CM | POA: Insufficient documentation

## 2021-07-22 DIAGNOSIS — Z1231 Encounter for screening mammogram for malignant neoplasm of breast: Secondary | ICD-10-CM | POA: Diagnosis not present

## 2021-07-22 DIAGNOSIS — Z1211 Encounter for screening for malignant neoplasm of colon: Secondary | ICD-10-CM | POA: Diagnosis not present

## 2021-10-22 DIAGNOSIS — L732 Hidradenitis suppurativa: Secondary | ICD-10-CM | POA: Diagnosis not present

## 2021-10-22 DIAGNOSIS — E1140 Type 2 diabetes mellitus with diabetic neuropathy, unspecified: Secondary | ICD-10-CM | POA: Diagnosis not present

## 2021-10-22 DIAGNOSIS — Z794 Long term (current) use of insulin: Secondary | ICD-10-CM | POA: Diagnosis not present

## 2021-10-22 DIAGNOSIS — Z6832 Body mass index (BMI) 32.0-32.9, adult: Secondary | ICD-10-CM | POA: Diagnosis not present

## 2021-10-26 DIAGNOSIS — Z1231 Encounter for screening mammogram for malignant neoplasm of breast: Secondary | ICD-10-CM | POA: Diagnosis not present

## 2022-03-04 DIAGNOSIS — L732 Hidradenitis suppurativa: Secondary | ICD-10-CM | POA: Diagnosis not present

## 2022-03-04 DIAGNOSIS — Z6833 Body mass index (BMI) 33.0-33.9, adult: Secondary | ICD-10-CM | POA: Diagnosis not present

## 2022-03-04 DIAGNOSIS — Z0000 Encounter for general adult medical examination without abnormal findings: Secondary | ICD-10-CM | POA: Diagnosis not present

## 2022-03-04 DIAGNOSIS — Z136 Encounter for screening for cardiovascular disorders: Secondary | ICD-10-CM | POA: Diagnosis not present

## 2022-03-04 DIAGNOSIS — E1140 Type 2 diabetes mellitus with diabetic neuropathy, unspecified: Secondary | ICD-10-CM | POA: Diagnosis not present

## 2022-03-04 DIAGNOSIS — Z8673 Personal history of transient ischemic attack (TIA), and cerebral infarction without residual deficits: Secondary | ICD-10-CM | POA: Diagnosis not present

## 2022-03-04 DIAGNOSIS — I10 Essential (primary) hypertension: Secondary | ICD-10-CM | POA: Diagnosis not present

## 2022-03-04 DIAGNOSIS — Z13220 Encounter for screening for lipoid disorders: Secondary | ICD-10-CM | POA: Diagnosis not present

## 2022-03-04 DIAGNOSIS — R69 Illness, unspecified: Secondary | ICD-10-CM | POA: Diagnosis not present

## 2022-03-04 DIAGNOSIS — Z1211 Encounter for screening for malignant neoplasm of colon: Secondary | ICD-10-CM | POA: Diagnosis not present

## 2022-03-04 DIAGNOSIS — Z794 Long term (current) use of insulin: Secondary | ICD-10-CM | POA: Diagnosis not present

## 2022-03-24 DIAGNOSIS — E785 Hyperlipidemia, unspecified: Secondary | ICD-10-CM | POA: Diagnosis not present

## 2022-03-24 DIAGNOSIS — Z7984 Long term (current) use of oral hypoglycemic drugs: Secondary | ICD-10-CM | POA: Diagnosis not present

## 2022-03-24 DIAGNOSIS — R69 Illness, unspecified: Secondary | ICD-10-CM | POA: Diagnosis not present

## 2022-03-24 DIAGNOSIS — I10 Essential (primary) hypertension: Secondary | ICD-10-CM | POA: Diagnosis not present

## 2022-03-24 DIAGNOSIS — Z7982 Long term (current) use of aspirin: Secondary | ICD-10-CM | POA: Diagnosis not present

## 2022-03-24 DIAGNOSIS — E119 Type 2 diabetes mellitus without complications: Secondary | ICD-10-CM | POA: Diagnosis not present

## 2022-03-24 DIAGNOSIS — Z7902 Long term (current) use of antithrombotics/antiplatelets: Secondary | ICD-10-CM | POA: Diagnosis not present

## 2022-03-24 DIAGNOSIS — L732 Hidradenitis suppurativa: Secondary | ICD-10-CM | POA: Diagnosis not present

## 2022-03-24 DIAGNOSIS — Z79899 Other long term (current) drug therapy: Secondary | ICD-10-CM | POA: Diagnosis not present

## 2022-03-24 DIAGNOSIS — Z8673 Personal history of transient ischemic attack (TIA), and cerebral infarction without residual deficits: Secondary | ICD-10-CM | POA: Diagnosis not present

## 2022-04-07 DIAGNOSIS — E113291 Type 2 diabetes mellitus with mild nonproliferative diabetic retinopathy without macular edema, right eye: Secondary | ICD-10-CM | POA: Diagnosis not present

## 2022-07-13 DIAGNOSIS — Z8673 Personal history of transient ischemic attack (TIA), and cerebral infarction without residual deficits: Secondary | ICD-10-CM | POA: Diagnosis not present

## 2022-07-13 DIAGNOSIS — L732 Hidradenitis suppurativa: Secondary | ICD-10-CM | POA: Diagnosis not present

## 2022-07-13 DIAGNOSIS — Z794 Long term (current) use of insulin: Secondary | ICD-10-CM | POA: Diagnosis not present

## 2022-07-13 DIAGNOSIS — E1140 Type 2 diabetes mellitus with diabetic neuropathy, unspecified: Secondary | ICD-10-CM | POA: Diagnosis not present

## 2022-07-13 DIAGNOSIS — Z6833 Body mass index (BMI) 33.0-33.9, adult: Secondary | ICD-10-CM | POA: Diagnosis not present

## 2022-07-13 DIAGNOSIS — R040 Epistaxis: Secondary | ICD-10-CM | POA: Diagnosis not present

## 2022-07-13 DIAGNOSIS — K2100 Gastro-esophageal reflux disease with esophagitis, without bleeding: Secondary | ICD-10-CM | POA: Diagnosis not present

## 2022-10-22 DIAGNOSIS — R69 Illness, unspecified: Secondary | ICD-10-CM | POA: Diagnosis not present

## 2022-10-22 DIAGNOSIS — F17200 Nicotine dependence, unspecified, uncomplicated: Secondary | ICD-10-CM | POA: Insufficient documentation

## 2022-11-22 DIAGNOSIS — Z7902 Long term (current) use of antithrombotics/antiplatelets: Secondary | ICD-10-CM | POA: Diagnosis not present

## 2022-11-22 DIAGNOSIS — Z792 Long term (current) use of antibiotics: Secondary | ICD-10-CM | POA: Diagnosis not present

## 2022-11-22 DIAGNOSIS — Z794 Long term (current) use of insulin: Secondary | ICD-10-CM | POA: Diagnosis not present

## 2022-11-22 DIAGNOSIS — R0902 Hypoxemia: Secondary | ICD-10-CM | POA: Diagnosis not present

## 2022-11-22 DIAGNOSIS — Z7901 Long term (current) use of anticoagulants: Secondary | ICD-10-CM | POA: Diagnosis not present

## 2022-11-22 DIAGNOSIS — E1140 Type 2 diabetes mellitus with diabetic neuropathy, unspecified: Secondary | ICD-10-CM | POA: Diagnosis not present

## 2022-11-22 DIAGNOSIS — I10 Essential (primary) hypertension: Secondary | ICD-10-CM | POA: Diagnosis not present

## 2022-11-22 DIAGNOSIS — J159 Unspecified bacterial pneumonia: Secondary | ICD-10-CM | POA: Diagnosis not present

## 2022-11-22 DIAGNOSIS — N3289 Other specified disorders of bladder: Secondary | ICD-10-CM | POA: Diagnosis not present

## 2022-11-22 DIAGNOSIS — N3001 Acute cystitis with hematuria: Secondary | ICD-10-CM | POA: Diagnosis not present

## 2022-11-22 DIAGNOSIS — N3080 Other cystitis without hematuria: Secondary | ICD-10-CM | POA: Diagnosis not present

## 2022-11-22 DIAGNOSIS — B9620 Unspecified Escherichia coli [E. coli] as the cause of diseases classified elsewhere: Secondary | ICD-10-CM | POA: Diagnosis not present

## 2022-11-22 DIAGNOSIS — E1165 Type 2 diabetes mellitus with hyperglycemia: Secondary | ICD-10-CM | POA: Diagnosis not present

## 2022-11-22 DIAGNOSIS — Z7984 Long term (current) use of oral hypoglycemic drugs: Secondary | ICD-10-CM | POA: Diagnosis not present

## 2022-11-22 DIAGNOSIS — R030 Elevated blood-pressure reading, without diagnosis of hypertension: Secondary | ICD-10-CM | POA: Diagnosis not present

## 2022-11-22 DIAGNOSIS — E785 Hyperlipidemia, unspecified: Secondary | ICD-10-CM | POA: Diagnosis not present

## 2022-11-22 DIAGNOSIS — F17210 Nicotine dependence, cigarettes, uncomplicated: Secondary | ICD-10-CM | POA: Diagnosis not present

## 2022-11-22 DIAGNOSIS — E119 Type 2 diabetes mellitus without complications: Secondary | ICD-10-CM | POA: Diagnosis not present

## 2022-11-22 DIAGNOSIS — J189 Pneumonia, unspecified organism: Secondary | ICD-10-CM | POA: Diagnosis not present

## 2022-11-22 DIAGNOSIS — Z1152 Encounter for screening for COVID-19: Secondary | ICD-10-CM | POA: Diagnosis not present

## 2022-11-22 DIAGNOSIS — N10 Acute tubulo-interstitial nephritis: Secondary | ICD-10-CM | POA: Diagnosis not present

## 2022-11-22 DIAGNOSIS — Z79899 Other long term (current) drug therapy: Secondary | ICD-10-CM | POA: Diagnosis not present

## 2022-11-22 DIAGNOSIS — Z8673 Personal history of transient ischemic attack (TIA), and cerebral infarction without residual deficits: Secondary | ICD-10-CM | POA: Diagnosis not present

## 2022-11-24 DIAGNOSIS — R0902 Hypoxemia: Secondary | ICD-10-CM | POA: Diagnosis not present

## 2022-12-06 DIAGNOSIS — N939 Abnormal uterine and vaginal bleeding, unspecified: Secondary | ICD-10-CM | POA: Diagnosis not present

## 2022-12-06 DIAGNOSIS — Z90710 Acquired absence of both cervix and uterus: Secondary | ICD-10-CM | POA: Diagnosis not present

## 2022-12-06 DIAGNOSIS — R102 Pelvic and perineal pain: Secondary | ICD-10-CM | POA: Diagnosis not present

## 2022-12-06 DIAGNOSIS — N12 Tubulo-interstitial nephritis, not specified as acute or chronic: Secondary | ICD-10-CM | POA: Diagnosis not present

## 2022-12-06 LAB — HM PAP SMEAR: HM Pap smear: NORMAL

## 2022-12-09 DIAGNOSIS — Z87448 Personal history of other diseases of urinary system: Secondary | ICD-10-CM | POA: Diagnosis not present

## 2022-12-09 DIAGNOSIS — E1140 Type 2 diabetes mellitus with diabetic neuropathy, unspecified: Secondary | ICD-10-CM | POA: Diagnosis not present

## 2022-12-09 DIAGNOSIS — Z794 Long term (current) use of insulin: Secondary | ICD-10-CM | POA: Diagnosis not present

## 2022-12-13 DIAGNOSIS — R102 Pelvic and perineal pain: Secondary | ICD-10-CM | POA: Diagnosis not present

## 2022-12-13 DIAGNOSIS — Z90722 Acquired absence of ovaries, bilateral: Secondary | ICD-10-CM | POA: Diagnosis not present

## 2022-12-13 DIAGNOSIS — Z90710 Acquired absence of both cervix and uterus: Secondary | ICD-10-CM | POA: Diagnosis not present

## 2022-12-22 DIAGNOSIS — Z1211 Encounter for screening for malignant neoplasm of colon: Secondary | ICD-10-CM | POA: Diagnosis not present

## 2022-12-31 DIAGNOSIS — I1A0 Resistant hypertension: Secondary | ICD-10-CM | POA: Diagnosis not present

## 2022-12-31 DIAGNOSIS — E1140 Type 2 diabetes mellitus with diabetic neuropathy, unspecified: Secondary | ICD-10-CM | POA: Diagnosis not present

## 2022-12-31 DIAGNOSIS — Z794 Long term (current) use of insulin: Secondary | ICD-10-CM | POA: Diagnosis not present

## 2023-01-10 DIAGNOSIS — E785 Hyperlipidemia, unspecified: Secondary | ICD-10-CM | POA: Diagnosis not present

## 2023-01-10 DIAGNOSIS — I700 Atherosclerosis of aorta: Secondary | ICD-10-CM | POA: Insufficient documentation

## 2023-01-10 DIAGNOSIS — I10 Essential (primary) hypertension: Secondary | ICD-10-CM | POA: Diagnosis not present

## 2023-01-10 DIAGNOSIS — R9431 Abnormal electrocardiogram [ECG] [EKG]: Secondary | ICD-10-CM | POA: Diagnosis not present

## 2023-01-25 DIAGNOSIS — I10 Essential (primary) hypertension: Secondary | ICD-10-CM | POA: Diagnosis not present

## 2023-01-25 DIAGNOSIS — R9431 Abnormal electrocardiogram [ECG] [EKG]: Secondary | ICD-10-CM | POA: Diagnosis not present

## 2023-01-25 DIAGNOSIS — I517 Cardiomegaly: Secondary | ICD-10-CM | POA: Diagnosis not present

## 2023-01-28 DIAGNOSIS — I10 Essential (primary) hypertension: Secondary | ICD-10-CM | POA: Diagnosis not present

## 2023-02-15 DIAGNOSIS — I10 Essential (primary) hypertension: Secondary | ICD-10-CM | POA: Diagnosis not present

## 2023-02-15 DIAGNOSIS — I700 Atherosclerosis of aorta: Secondary | ICD-10-CM | POA: Diagnosis not present

## 2023-02-15 DIAGNOSIS — F17200 Nicotine dependence, unspecified, uncomplicated: Secondary | ICD-10-CM | POA: Diagnosis not present

## 2023-02-15 DIAGNOSIS — E785 Hyperlipidemia, unspecified: Secondary | ICD-10-CM | POA: Diagnosis not present

## 2023-02-15 DIAGNOSIS — I5032 Chronic diastolic (congestive) heart failure: Secondary | ICD-10-CM | POA: Diagnosis not present

## 2023-02-15 DIAGNOSIS — I484 Atypical atrial flutter: Secondary | ICD-10-CM | POA: Diagnosis not present

## 2023-02-25 DIAGNOSIS — Z8673 Personal history of transient ischemic attack (TIA), and cerebral infarction without residual deficits: Secondary | ICD-10-CM | POA: Diagnosis not present

## 2023-02-25 DIAGNOSIS — E1140 Type 2 diabetes mellitus with diabetic neuropathy, unspecified: Secondary | ICD-10-CM | POA: Diagnosis not present

## 2023-02-25 DIAGNOSIS — F418 Other specified anxiety disorders: Secondary | ICD-10-CM | POA: Diagnosis not present

## 2023-02-25 DIAGNOSIS — I10 Essential (primary) hypertension: Secondary | ICD-10-CM | POA: Diagnosis not present

## 2023-02-25 DIAGNOSIS — Z794 Long term (current) use of insulin: Secondary | ICD-10-CM | POA: Diagnosis not present

## 2023-02-25 DIAGNOSIS — Z23 Encounter for immunization: Secondary | ICD-10-CM | POA: Diagnosis not present

## 2023-02-25 LAB — MICROALBUMIN / CREATININE URINE RATIO: Microalb Creat Ratio: 12.4

## 2023-02-25 LAB — PROTEIN / CREATININE RATIO, URINE
Albumin, U: 2.5
Creatinine, Urine: 201.3

## 2023-03-03 DIAGNOSIS — Q438 Other specified congenital malformations of intestine: Secondary | ICD-10-CM | POA: Diagnosis not present

## 2023-03-03 DIAGNOSIS — Z7984 Long term (current) use of oral hypoglycemic drugs: Secondary | ICD-10-CM | POA: Diagnosis not present

## 2023-03-03 DIAGNOSIS — E785 Hyperlipidemia, unspecified: Secondary | ICD-10-CM | POA: Diagnosis not present

## 2023-03-03 DIAGNOSIS — Z8673 Personal history of transient ischemic attack (TIA), and cerebral infarction without residual deficits: Secondary | ICD-10-CM | POA: Diagnosis not present

## 2023-03-03 DIAGNOSIS — F17210 Nicotine dependence, cigarettes, uncomplicated: Secondary | ICD-10-CM | POA: Diagnosis not present

## 2023-03-03 DIAGNOSIS — I10 Essential (primary) hypertension: Secondary | ICD-10-CM | POA: Diagnosis not present

## 2023-03-03 DIAGNOSIS — E1140 Type 2 diabetes mellitus with diabetic neuropathy, unspecified: Secondary | ICD-10-CM | POA: Diagnosis not present

## 2023-03-03 DIAGNOSIS — Z1211 Encounter for screening for malignant neoplasm of colon: Secondary | ICD-10-CM | POA: Diagnosis not present

## 2023-03-03 DIAGNOSIS — Z7982 Long term (current) use of aspirin: Secondary | ICD-10-CM | POA: Diagnosis not present

## 2023-03-03 LAB — HM COLONOSCOPY

## 2023-04-16 DIAGNOSIS — E1142 Type 2 diabetes mellitus with diabetic polyneuropathy: Secondary | ICD-10-CM | POA: Diagnosis not present

## 2023-04-16 DIAGNOSIS — K5900 Constipation, unspecified: Secondary | ICD-10-CM | POA: Diagnosis not present

## 2023-04-16 DIAGNOSIS — E1143 Type 2 diabetes mellitus with diabetic autonomic (poly)neuropathy: Secondary | ICD-10-CM | POA: Diagnosis not present

## 2023-04-16 DIAGNOSIS — Z8249 Family history of ischemic heart disease and other diseases of the circulatory system: Secondary | ICD-10-CM | POA: Diagnosis not present

## 2023-04-16 DIAGNOSIS — Z720 Tobacco use: Secondary | ICD-10-CM | POA: Diagnosis not present

## 2023-04-16 DIAGNOSIS — E785 Hyperlipidemia, unspecified: Secondary | ICD-10-CM | POA: Diagnosis not present

## 2023-04-16 DIAGNOSIS — Z7984 Long term (current) use of oral hypoglycemic drugs: Secondary | ICD-10-CM | POA: Diagnosis not present

## 2023-04-16 DIAGNOSIS — Z7902 Long term (current) use of antithrombotics/antiplatelets: Secondary | ICD-10-CM | POA: Diagnosis not present

## 2023-04-16 DIAGNOSIS — E1159 Type 2 diabetes mellitus with other circulatory complications: Secondary | ICD-10-CM | POA: Diagnosis not present

## 2023-04-16 DIAGNOSIS — I10 Essential (primary) hypertension: Secondary | ICD-10-CM | POA: Diagnosis not present

## 2023-04-16 DIAGNOSIS — Z7982 Long term (current) use of aspirin: Secondary | ICD-10-CM | POA: Diagnosis not present

## 2023-04-16 DIAGNOSIS — K219 Gastro-esophageal reflux disease without esophagitis: Secondary | ICD-10-CM | POA: Diagnosis not present

## 2023-05-31 DIAGNOSIS — E1140 Type 2 diabetes mellitus with diabetic neuropathy, unspecified: Secondary | ICD-10-CM | POA: Diagnosis not present

## 2023-05-31 DIAGNOSIS — Z794 Long term (current) use of insulin: Secondary | ICD-10-CM | POA: Diagnosis not present

## 2023-05-31 DIAGNOSIS — F419 Anxiety disorder, unspecified: Secondary | ICD-10-CM | POA: Diagnosis not present

## 2023-05-31 DIAGNOSIS — I10 Essential (primary) hypertension: Secondary | ICD-10-CM | POA: Diagnosis not present

## 2023-05-31 DIAGNOSIS — L739 Follicular disorder, unspecified: Secondary | ICD-10-CM | POA: Diagnosis not present

## 2023-05-31 DIAGNOSIS — F32A Depression, unspecified: Secondary | ICD-10-CM | POA: Diagnosis not present

## 2023-05-31 DIAGNOSIS — F4321 Adjustment disorder with depressed mood: Secondary | ICD-10-CM | POA: Diagnosis not present

## 2023-05-31 DIAGNOSIS — J4520 Mild intermittent asthma, uncomplicated: Secondary | ICD-10-CM | POA: Diagnosis not present

## 2023-06-24 DIAGNOSIS — H524 Presbyopia: Secondary | ICD-10-CM | POA: Diagnosis not present

## 2023-06-24 DIAGNOSIS — H35033 Hypertensive retinopathy, bilateral: Secondary | ICD-10-CM | POA: Diagnosis not present

## 2023-06-24 LAB — HM DIABETES EYE EXAM

## 2023-07-03 DIAGNOSIS — Z872 Personal history of diseases of the skin and subcutaneous tissue: Secondary | ICD-10-CM | POA: Diagnosis not present

## 2023-07-03 DIAGNOSIS — L0291 Cutaneous abscess, unspecified: Secondary | ICD-10-CM | POA: Diagnosis not present

## 2023-07-03 DIAGNOSIS — F17210 Nicotine dependence, cigarettes, uncomplicated: Secondary | ICD-10-CM | POA: Diagnosis not present

## 2023-07-03 DIAGNOSIS — I10 Essential (primary) hypertension: Secondary | ICD-10-CM | POA: Diagnosis not present

## 2023-07-03 DIAGNOSIS — E785 Hyperlipidemia, unspecified: Secondary | ICD-10-CM | POA: Diagnosis not present

## 2023-07-03 DIAGNOSIS — Z7982 Long term (current) use of aspirin: Secondary | ICD-10-CM | POA: Diagnosis not present

## 2023-07-03 DIAGNOSIS — L0211 Cutaneous abscess of neck: Secondary | ICD-10-CM | POA: Diagnosis not present

## 2023-07-03 DIAGNOSIS — Z7984 Long term (current) use of oral hypoglycemic drugs: Secondary | ICD-10-CM | POA: Diagnosis not present

## 2023-07-03 DIAGNOSIS — Z79899 Other long term (current) drug therapy: Secondary | ICD-10-CM | POA: Diagnosis not present

## 2023-07-03 DIAGNOSIS — E1140 Type 2 diabetes mellitus with diabetic neuropathy, unspecified: Secondary | ICD-10-CM | POA: Diagnosis not present

## 2023-07-03 DIAGNOSIS — L02414 Cutaneous abscess of left upper limb: Secondary | ICD-10-CM | POA: Diagnosis not present

## 2023-07-03 LAB — COMPREHENSIVE METABOLIC PANEL: eGFR: 62

## 2023-07-18 DIAGNOSIS — E11319 Type 2 diabetes mellitus with unspecified diabetic retinopathy without macular edema: Secondary | ICD-10-CM | POA: Insufficient documentation

## 2023-09-06 DIAGNOSIS — F419 Anxiety disorder, unspecified: Secondary | ICD-10-CM | POA: Diagnosis not present

## 2023-09-06 DIAGNOSIS — R112 Nausea with vomiting, unspecified: Secondary | ICD-10-CM | POA: Diagnosis not present

## 2023-09-06 DIAGNOSIS — E1140 Type 2 diabetes mellitus with diabetic neuropathy, unspecified: Secondary | ICD-10-CM | POA: Diagnosis not present

## 2023-09-06 DIAGNOSIS — F4321 Adjustment disorder with depressed mood: Secondary | ICD-10-CM | POA: Diagnosis not present

## 2023-09-06 DIAGNOSIS — B3731 Acute candidiasis of vulva and vagina: Secondary | ICD-10-CM | POA: Diagnosis not present

## 2023-09-06 DIAGNOSIS — F32A Depression, unspecified: Secondary | ICD-10-CM | POA: Diagnosis not present

## 2023-09-06 DIAGNOSIS — K2100 Gastro-esophageal reflux disease with esophagitis, without bleeding: Secondary | ICD-10-CM | POA: Diagnosis not present

## 2023-09-06 DIAGNOSIS — Z794 Long term (current) use of insulin: Secondary | ICD-10-CM | POA: Diagnosis not present

## 2023-09-06 LAB — HEMOGLOBIN A1C: Hemoglobin A1C: 8.4

## 2023-12-21 ENCOUNTER — Encounter: Payer: Self-pay | Admitting: Family Medicine

## 2023-12-21 ENCOUNTER — Other Ambulatory Visit: Payer: Self-pay | Admitting: Family Medicine

## 2023-12-21 ENCOUNTER — Ambulatory Visit: Payer: Self-pay | Admitting: Family Medicine

## 2024-01-31 ENCOUNTER — Telehealth: Payer: Self-pay | Admitting: Family Medicine

## 2024-02-06 NOTE — Telephone Encounter (Signed)
 Noted.
